# Patient Record
Sex: Male | Born: 1935 | Race: White | Hispanic: No | Marital: Married | Smoking: Never smoker
Health system: Southern US, Community
[De-identification: ages and names within clinical notes are randomized; demographics above are authoritative.]

## PROBLEM LIST (undated history)

## (undated) DIAGNOSIS — S6990XA Unspecified injury of unspecified wrist, hand and finger(s), initial encounter: Secondary | ICD-10-CM

## (undated) DIAGNOSIS — E785 Hyperlipidemia, unspecified: Secondary | ICD-10-CM

## (undated) DIAGNOSIS — I4891 Unspecified atrial fibrillation: Secondary | ICD-10-CM

## (undated) DIAGNOSIS — Z87828 Personal history of other (healed) physical injury and trauma: Secondary | ICD-10-CM

## (undated) DIAGNOSIS — I639 Cerebral infarction, unspecified: Secondary | ICD-10-CM

## (undated) DIAGNOSIS — C801 Malignant (primary) neoplasm, unspecified: Secondary | ICD-10-CM

## (undated) DIAGNOSIS — Z9889 Other specified postprocedural states: Secondary | ICD-10-CM

## (undated) DIAGNOSIS — R42 Dizziness and giddiness: Secondary | ICD-10-CM

## (undated) DIAGNOSIS — I10 Essential (primary) hypertension: Secondary | ICD-10-CM

## (undated) DIAGNOSIS — D689 Coagulation defect, unspecified: Secondary | ICD-10-CM

## (undated) HISTORY — DX: Personal history of other (healed) physical injury and trauma: Z87.828

## (undated) HISTORY — DX: Coagulation defect, unspecified: D68.9

## (undated) HISTORY — DX: Malignant (primary) neoplasm, unspecified: C80.1

## (undated) HISTORY — DX: Essential (primary) hypertension: I10

## (undated) HISTORY — DX: Unspecified injury of unspecified wrist, hand and finger(s), initial encounter: S69.90XA

## (undated) HISTORY — DX: Hyperlipidemia, unspecified: E78.5

## (undated) HISTORY — DX: Dizziness and giddiness: R42

## (undated) HISTORY — DX: Cerebral infarction, unspecified: I63.9

## (undated) HISTORY — DX: Other specified postprocedural states: Z98.890

---

## 1961-09-30 HISTORY — PX: FINGER DEBRIDEMENT: SHX1634

## 2001-09-30 HISTORY — PX: BACK SURGERY: SHX140

## 2004-09-30 HISTORY — PX: REPLACEMENT TOTAL KNEE: SUR1224

## 2017-08-06 ENCOUNTER — Ambulatory Visit (INDEPENDENT_AMBULATORY_CARE_PROVIDER_SITE_OTHER): Payer: Medicare Other | Admitting: Family Medicine

## 2017-08-06 ENCOUNTER — Encounter: Payer: Self-pay | Admitting: Family Medicine

## 2017-08-06 VITALS — BP 108/68 | HR 62 | Resp 16 | Ht 71.0 in | Wt 200.0 lb

## 2017-08-06 DIAGNOSIS — D75839 Thrombocytosis, unspecified: Secondary | ICD-10-CM

## 2017-08-06 DIAGNOSIS — G5603 Carpal tunnel syndrome, bilateral upper limbs: Secondary | ICD-10-CM | POA: Diagnosis not present

## 2017-08-06 DIAGNOSIS — R42 Dizziness and giddiness: Secondary | ICD-10-CM | POA: Diagnosis not present

## 2017-08-06 DIAGNOSIS — Z23 Encounter for immunization: Secondary | ICD-10-CM

## 2017-08-06 DIAGNOSIS — I480 Paroxysmal atrial fibrillation: Secondary | ICD-10-CM

## 2017-08-06 DIAGNOSIS — E785 Hyperlipidemia, unspecified: Secondary | ICD-10-CM

## 2017-08-06 DIAGNOSIS — E538 Deficiency of other specified B group vitamins: Secondary | ICD-10-CM | POA: Diagnosis not present

## 2017-08-06 DIAGNOSIS — D473 Essential (hemorrhagic) thrombocythemia: Secondary | ICD-10-CM

## 2017-08-06 DIAGNOSIS — I4891 Unspecified atrial fibrillation: Secondary | ICD-10-CM | POA: Insufficient documentation

## 2017-08-07 DIAGNOSIS — G5600 Carpal tunnel syndrome, unspecified upper limb: Secondary | ICD-10-CM | POA: Insufficient documentation

## 2017-08-07 LAB — LIPID PANEL
CHOLESTEROL TOTAL: 125 mg/dL (ref 100–199)
Chol/HDL Ratio: 3 ratio (ref 0.0–5.0)
HDL: 42 mg/dL (ref >39)
LDL Calculated: 62 mg/dL (ref 0–99)
TRIGLYCERIDES: 103 mg/dL (ref 0–149)
VLDL Cholesterol Cal: 21 mg/dL (ref 5–40)

## 2017-08-07 LAB — CBC
HEMOGLOBIN: 14.5 g/dL (ref 13.0–17.7)
Hematocrit: 43.8 % (ref 37.5–51.0)
MCH: 38.1 pg — ABNORMAL HIGH (ref 26.6–33.0)
MCHC: 33.1 g/dL (ref 31.5–35.7)
MCV: 115 fL — ABNORMAL HIGH (ref 79–97)
Platelets: 706 10*3/uL — ABNORMAL HIGH (ref 150–379)
RBC: 3.81 x10E6/uL — ABNORMAL LOW (ref 4.14–5.80)
RDW: 15.9 % — AB (ref 12.3–15.4)
WBC: 11.4 10*3/uL — AB (ref 3.4–10.8)

## 2017-08-07 LAB — COMPREHENSIVE METABOLIC PANEL
A/G RATIO: 1.6 (ref 1.2–2.2)
ALK PHOS: 85 IU/L (ref 39–117)
ALT: 16 IU/L (ref 0–44)
AST: 20 IU/L (ref 0–40)
Albumin: 4.2 g/dL (ref 3.2–4.6)
BILIRUBIN TOTAL: 0.4 mg/dL (ref 0.0–1.2)
BUN / CREAT RATIO: 16 (ref 10–24)
BUN: 18 mg/dL (ref 10–36)
CHLORIDE: 103 mmol/L (ref 96–106)
CO2: 28 mmol/L (ref 20–29)
Calcium: 9.3 mg/dL (ref 8.6–10.2)
Creatinine, Ser: 1.1 mg/dL (ref 0.76–1.27)
GFR calc non Af Amer: 59 mL/min/{1.73_m2} — ABNORMAL LOW (ref >59)
GFR, EST AFRICAN AMERICAN: 68 mL/min/{1.73_m2} (ref >59)
GLUCOSE: 99 mg/dL (ref 65–99)
Globulin, Total: 2.6 g/dL (ref 1.5–4.5)
POTASSIUM: 5.5 mmol/L — AB (ref 3.5–5.2)
Sodium: 142 mmol/L (ref 134–144)
TOTAL PROTEIN: 6.8 g/dL (ref 6.0–8.5)

## 2017-08-07 LAB — VITAMIN B12: VITAMIN B 12: 717 pg/mL (ref 232–1245)

## 2017-08-07 LAB — TSH: TSH: 4.96 u[IU]/mL — AB (ref 0.450–4.500)

## 2017-08-07 NOTE — Progress Notes (Signed)
Date:  08/06/2017   Name:  Lee Mercado   DOB:  [birth year]   MRN:  [id number]  PCP:  Adline Potter, MD    Chief Complaint: Establish Care (platelet count 2 days ago in [facility] was abnormal. Already on Eliquis. Requesting not to  change Eliquis as they went through lot of mess with insurance to get it covered. ) and Hand Pain (circulation is awfula t night and wakes him with nerve pain or asleep hands. R hand worse. )   History of Present Illness:  This is a 83 y.o. male see for initial visit, just moved here from [facility]. Hx afib and TIA on metoprolol/Eliquis past year, recurrent vertigo since "ministroke" 2 yrs ago, takes meclizine bid, occ postural dizziness. HLD on Pravachol x yrs. Thrombocytosis on hydroxyurea, told plts high last week in [place]. OA knees, Voltaren gel helps. Uses cane to ambulate, no recent falls. B hand pain and numbness mostly at night x yrs, R > L. Take mvit, B12, fish oil, takes full asa when feels bad, helps. S/p circumcision 3 yrs ago. BLE edema chronic, improves overnight, has used comp stockings in past, hospitalized for cellulitis R leg 3 months ago. Father died COPD/smoking 77, mother died lymphoma 22, sibs with lung ca, bone ca, DM. No known colonoscopy, last tet imm 15 yrs ago, had flu shot last month in [facility], had single pneumo imm yrs ago.   Review of Systems:  Review of Systems  Constitutional: Negative for chills and fever.  HENT: Negative for ear pain, sinus pain and trouble swallowing.   Eyes: Negative for pain.  Respiratory: Negative for cough and shortness of breath.   Cardiovascular: Negative for chest pain.  Gastrointestinal: Negative for abdominal pain, constipation and diarrhea.  Genitourinary: Negative for difficulty urinating.  Musculoskeletal: Negative for joint swelling.  Neurological: Negative for tremors and syncope.  Hematological: Negative for adenopathy.    Patient Active Problem List   Diagnosis Date Noted  . Thrombocytosis ([place]) 08/06/2017  .  Hyperlipidemia 08/06/2017  . Atrial fibrillation ([place]) 08/06/2017    Prior to Admission medications   Medication Sig Start Date End Date Taking? Authorizing Provider  apixaban (ELIQUIS) 5 MG TABS tablet Take 5 mg 2 (two) times daily by mouth.   Yes [provider]  aspirin EC 81 MG tablet Take 81 mg daily by mouth.   Yes [provider]  diclofenac sodium (VOLTAREN) 1 % GEL Apply 4 (four) times daily topically.   Yes [provider]  hydroxyurea (HYDREA) 500 MG capsule Take 500 mg daily by mouth. May take with food to minimize GI side effects.   Yes [provider]  meclizine (ANTIVERT) 25 MG tablet Take 25 mg 2 (two) times daily by mouth.    Yes [provider]  METOPROLOL TARTRATE PO Take 12.5 mg 2 (two) times daily by mouth.   Yes [provider]  pravastatin (PRAVACHOL) 20 MG tablet Take 20 mg daily by mouth.   Yes [provider]    No Known Allergies  Past Surgical History:  Procedure Laterality Date  . BACK SURGERY  2003  . FINGER DEBRIDEMENT  1963   Left hand 4 digit   . REPLACEMENT TOTAL KNEE Right 2006    Social History   Tobacco Use  . Smoking status: Never Smoker  . Smokeless tobacco: Never Used  Substance Use Topics  . Alcohol use: No    Frequency: Never  . Drug use: No    Family History  Problem Relation Age of Onset  . Cancer Mother   . Emphysema Father   . Diabetes Sister   . Paget's disease of bone Brother   . Cancer Sister        lung cancer    Medication list has been reviewed and updated.  Physical Examination: BP 108/68   Pulse 62   Resp 16   Ht 5\' 11"  (1.803 m)   Wt 200 lb (90.7 kg)   BMI 27.89 kg/m   Physical Exam  Constitutional: He is oriented to person, place, and time. He appears well-developed and well-nourished.  HENT:  Head: Normocephalic and atraumatic.  Right Ear: External ear normal.  Left Ear: External ear normal.  Nose: Nose normal.  Mouth/Throat:  Oropharynx is clear and moist.  TMs clear  Eyes: Conjunctivae and EOM are normal. Pupils are equal, round, and reactive to light.  Neck: Neck supple. No thyromegaly present.  Cardiovascular: Normal rate, regular rhythm, normal heart sounds and intact distal pulses.  Pulmonary/Chest: Effort normal and breath sounds normal.  Abdominal: Soft. He exhibits no distension and no mass. There is no tenderness.  Musculoskeletal:  2+ BLE edema  Lymphadenopathy:    He has no cervical adenopathy.  Neurological: He is alert and oriented to person, place, and time. Coordination normal.  Romberg negative but gait wide-based and shuffling  Skin: Skin is warm and dry.  Psychiatric: He has a normal mood and affect. His behavior is normal.  Nursing note and vitals reviewed.   Assessment and Plan:  1. Thrombocytosis (HCC) On hydroxyurea/asa - CBC  2. Paroxysmal atrial fibrillation (HCC) Stable on metoprolol/Eliquis - Comprehensive Metabolic Panel (CMET) - TSH  3. Bilateral carpal tunnel syndrome OTC B wrist splints overnight, consider ortho referral for injection if persists  4. Hyperlipidemia, unspecified hyperlipidemia type On Pravachol - Lipid Profile  5. Vertigo S/p CVA, cont meclizine for now though may contribute to confusion/falls, consider ENT referral  6. B12 deficiency On supplement - B12  7. Need for diphtheria-tetanus-pertussis (Tdap) vaccine - Tdap vaccine greater than or equal to 7yo IM  8. Need for pneumococcal vaccination - Pneumococcal conjugate vaccine 13-valent  Return in about 4 weeks (around 09/03/2017).   45 mins spent with pt over half in counseling  Satira Anis. Tyjai Charbonnet, [facility]  08/07/2017

## 2017-09-03 ENCOUNTER — Ambulatory Visit (INDEPENDENT_AMBULATORY_CARE_PROVIDER_SITE_OTHER): Payer: Medicare Other | Admitting: Family Medicine

## 2017-09-03 ENCOUNTER — Encounter: Payer: Self-pay | Admitting: Family Medicine

## 2017-09-03 VITALS — BP 110/74 | HR 65 | Resp 16 | Ht 71.0 in | Wt 198.0 lb

## 2017-09-03 DIAGNOSIS — D473 Essential (hemorrhagic) thrombocythemia: Secondary | ICD-10-CM | POA: Diagnosis not present

## 2017-09-03 DIAGNOSIS — L409 Psoriasis, unspecified: Secondary | ICD-10-CM | POA: Diagnosis not present

## 2017-09-03 DIAGNOSIS — I480 Paroxysmal atrial fibrillation: Secondary | ICD-10-CM | POA: Diagnosis not present

## 2017-09-03 DIAGNOSIS — G5603 Carpal tunnel syndrome, bilateral upper limbs: Secondary | ICD-10-CM

## 2017-09-03 DIAGNOSIS — E538 Deficiency of other specified B group vitamins: Secondary | ICD-10-CM

## 2017-09-03 DIAGNOSIS — E785 Hyperlipidemia, unspecified: Secondary | ICD-10-CM | POA: Diagnosis not present

## 2017-09-03 DIAGNOSIS — D75839 Thrombocytosis, unspecified: Secondary | ICD-10-CM

## 2017-09-03 NOTE — Patient Instructions (Signed)
Use bilateral wrists splints overnight for carpal tunnel syndrome.

## 2017-09-04 ENCOUNTER — Encounter: Payer: Self-pay | Admitting: Family Medicine

## 2017-09-04 DIAGNOSIS — N183 Chronic kidney disease, stage 3 unspecified: Secondary | ICD-10-CM | POA: Insufficient documentation

## 2017-09-04 DIAGNOSIS — E538 Deficiency of other specified B group vitamins: Secondary | ICD-10-CM | POA: Insufficient documentation

## 2017-09-04 DIAGNOSIS — L409 Psoriasis, unspecified: Secondary | ICD-10-CM | POA: Insufficient documentation

## 2017-09-04 LAB — BASIC METABOLIC PANEL
BUN / CREAT RATIO: 17 (ref 10–24)
BUN: 24 mg/dL (ref 10–36)
CO2: 25 mmol/L (ref 20–29)
Calcium: 9 mg/dL (ref 8.6–10.2)
Chloride: 103 mmol/L (ref 96–106)
Creatinine, Ser: 1.43 mg/dL — ABNORMAL HIGH (ref 0.76–1.27)
GFR calc Af Amer: 49 mL/min/{1.73_m2} — ABNORMAL LOW (ref >59)
GFR, EST NON AFRICAN AMERICAN: 43 mL/min/{1.73_m2} — AB (ref >59)
Glucose: 87 mg/dL (ref 65–99)
POTASSIUM: 5.3 mmol/L — AB (ref 3.5–5.2)
SODIUM: 142 mmol/L (ref 134–144)

## 2017-09-04 LAB — CBC
Hematocrit: 44.7 % (ref 37.5–51.0)
Hemoglobin: 14.9 g/dL (ref 13.0–17.7)
MCH: 39.3 pg — ABNORMAL HIGH (ref 26.6–33.0)
MCHC: 33.3 g/dL (ref 31.5–35.7)
MCV: 118 fL — AB (ref 79–97)
PLATELETS: 427 10*3/uL — AB (ref 150–379)
RBC: 3.79 x10E6/uL — ABNORMAL LOW (ref 4.14–5.80)
RDW: 16.8 % — AB (ref 12.3–15.4)
WBC: 8.6 10*3/uL (ref 3.4–10.8)

## 2017-09-04 LAB — FERRITIN: Ferritin: 375 ng/mL (ref 30–400)

## 2017-09-04 NOTE — Progress Notes (Signed)
Date:  09/03/2017   Name:  Lee Mercado   DOB:  1925-08-06   MRN:  [id number]  PCP:  Adline Potter, MD    Chief Complaint: Establish Care   History of Present Illness:  This is a 83 y.o. male seen for one month f/u from initial visit. Hydroxyurea dose increased due to elevated plt count. Remains on Eliquis and metoprolol for afib. Has not tried nightly wrist splints for B CTS. HLD on Pravachol, takes meclizine bid for vertigo s/p CVA, on B12 supplement. RLE edema stable. Saw derm for psoriasis.  Review of Systems:  Review of Systems  Constitutional: Negative for chills and fever.  Respiratory: Negative for cough and shortness of breath.   Cardiovascular: Negative for chest pain.  Genitourinary: Negative for difficulty urinating.  Neurological: Negative for syncope and light-headedness.    Patient Active Problem List   Diagnosis Date Noted  . Carpal tunnel syndrome 08/07/2017  . Thrombocytosis ([place]) 08/06/2017  . Hyperlipidemia 08/06/2017  . Atrial fibrillation ([place]) 08/06/2017    Prior to Admission medications   Medication Sig Start Date End Date Taking? Authorizing Provider  apixaban (ELIQUIS) 5 MG TABS tablet Take 5 mg 2 (two) times daily by mouth.   Yes [provider]  aspirin EC 81 MG tablet Take 81 mg daily by mouth.   Yes [provider]  cyanocobalamin 1000 MCG tablet Take 1,000 mcg daily by mouth.   Yes [provider]  diclofenac sodium (VOLTAREN) 1 % GEL Apply 4 (four) times daily topically.   Yes [provider]  hydroxyurea (HYDREA) 500 MG capsule Take 1,000 mg daily by mouth. May take with food to minimize GI side effects.    Yes [provider]  meclizine (ANTIVERT) 25 MG tablet Take 25 mg 2 (two) times daily by mouth.    Yes [provider]  METOPROLOL TARTRATE PO Take 12.5 mg 2 (two) times daily by mouth.   Yes [provider]  Multiple Vitamin (MULTIVITAMIN) tablet Take 1 tablet daily by mouth.    Yes [provider]  pravastatin (PRAVACHOL) 20 MG tablet Take 20 mg daily by mouth.   Yes [provider]    No Known Allergies  Past Surgical History:  Procedure Laterality Date  . BACK SURGERY  2003  . FINGER DEBRIDEMENT  1963   Left hand 4 digit   . REPLACEMENT TOTAL KNEE Right 2006    Social History   Tobacco Use  . Smoking status: Never Smoker  . Smokeless tobacco: Never Used  Substance Use Topics  . Alcohol use: No    Frequency: Never  . Drug use: No    Family History  Problem Relation Age of Onset  . Cancer Mother   . Emphysema Father   . Diabetes Sister   . Paget's disease of bone Brother   . Cancer Sister        lung cancer    Medication list has been reviewed and updated.  Physical Examination: BP 110/74   Pulse 65   Resp 16   Ht 5\' 11"  (1.803 m)   Wt 198 lb (89.8 kg)   SpO2 97%   BMI 27.62 kg/m   Physical Exam  Constitutional: He appears well-developed and well-nourished.  Cardiovascular: Normal rate, regular rhythm and normal heart sounds.  Pulmonary/Chest: Effort normal and breath sounds normal.  Musculoskeletal:  Trace RLE edema  Neurological: He is alert.  Skin: Skin is warm and dry.  Psychiatric: He has a normal mood  and affect. His behavior is normal.  Nursing note and vitals reviewed.   Assessment and Plan:  1. Thrombocytosis (HCC) On increased hydroxyurea - CBC - Ferritin - Basic Metabolic Panel (BMET)  2. Paroxysmal atrial fibrillation (HCC) Stable on Eliquis/metoprolol  3. Bilateral carpal tunnel syndrome Trial nightly B wrist splints  4. Hyperlipidemia, unspecified hyperlipidemia type Well controlled on Pravachol  5. Psoriasis Followed by derm  6. B12 deficiency Well controlled on supplement  7. Med review On meclizine bid for vertigo s/p CVA, consider taper  Return in about 3 months (around 12/02/2017).  Satira Anis. [facility]  09/04/2017

## 2017-09-11 ENCOUNTER — Telehealth: Payer: Self-pay

## 2017-09-11 NOTE — Telephone Encounter (Signed)
I had spoken to son already but he went there to pharmacy about meds we changed and upset that no script was delivered. I advised debra from pharmacy we did not write anything we went down on Meclizine. She will reassure him again.

## 2017-11-11 ENCOUNTER — Telehealth: Payer: Self-pay

## 2017-11-11 ENCOUNTER — Observation Stay
Admit: 2017-11-11 | Discharge: 2017-11-11 | Disposition: A | Discharge status: Home / Self Care | Payer: Medicare HMO | Attending: Internal Medicine | Admitting: Internal Medicine

## 2017-11-11 ENCOUNTER — Other Ambulatory Visit: Payer: Self-pay

## 2017-11-11 ENCOUNTER — Inpatient Hospital Stay
Admission: EM | Admit: 2017-11-11 | Discharge: 2017-11-13 | DRG: 064 | Disposition: A | Discharge status: Home / Self Care | Payer: Medicare HMO | Attending: Internal Medicine | Admitting: Internal Medicine

## 2017-11-11 ENCOUNTER — Encounter: Payer: Self-pay | Admitting: Emergency Medicine

## 2017-11-11 ENCOUNTER — Emergency Department: Payer: Medicare HMO

## 2017-11-11 DIAGNOSIS — Z7982 Long term (current) use of aspirin: Secondary | ICD-10-CM

## 2017-11-11 DIAGNOSIS — R4781 Slurred speech: Secondary | ICD-10-CM | POA: Diagnosis present

## 2017-11-11 DIAGNOSIS — E785 Hyperlipidemia, unspecified: Secondary | ICD-10-CM | POA: Diagnosis not present

## 2017-11-11 DIAGNOSIS — D689 Coagulation defect, unspecified: Secondary | ICD-10-CM | POA: Diagnosis present

## 2017-11-11 DIAGNOSIS — I4891 Unspecified atrial fibrillation: Secondary | ICD-10-CM | POA: Diagnosis not present

## 2017-11-11 DIAGNOSIS — Z66 Do not resuscitate: Secondary | ICD-10-CM | POA: Diagnosis present

## 2017-11-11 DIAGNOSIS — I10 Essential (primary) hypertension: Secondary | ICD-10-CM | POA: Diagnosis not present

## 2017-11-11 DIAGNOSIS — Z79899 Other long term (current) drug therapy: Secondary | ICD-10-CM

## 2017-11-11 DIAGNOSIS — I639 Cerebral infarction, unspecified: Secondary | ICD-10-CM | POA: Diagnosis not present

## 2017-11-11 DIAGNOSIS — M6281 Muscle weakness (generalized): Secondary | ICD-10-CM | POA: Diagnosis not present

## 2017-11-11 DIAGNOSIS — G459 Transient cerebral ischemic attack, unspecified: Secondary | ICD-10-CM | POA: Diagnosis present

## 2017-11-11 DIAGNOSIS — R29702 NIHSS score 2: Secondary | ICD-10-CM | POA: Diagnosis present

## 2017-11-11 DIAGNOSIS — G9341 Metabolic encephalopathy: Secondary | ICD-10-CM | POA: Diagnosis not present

## 2017-11-11 DIAGNOSIS — I482 Chronic atrial fibrillation: Secondary | ICD-10-CM | POA: Diagnosis present

## 2017-11-11 DIAGNOSIS — R4182 Altered mental status, unspecified: Secondary | ICD-10-CM | POA: Diagnosis not present

## 2017-11-11 DIAGNOSIS — D638 Anemia in other chronic diseases classified elsewhere: Secondary | ICD-10-CM | POA: Diagnosis present

## 2017-11-11 DIAGNOSIS — R531 Weakness: Secondary | ICD-10-CM | POA: Diagnosis not present

## 2017-11-11 DIAGNOSIS — Z7901 Long term (current) use of anticoagulants: Secondary | ICD-10-CM

## 2017-11-11 DIAGNOSIS — Z96651 Presence of right artificial knee joint: Secondary | ICD-10-CM | POA: Diagnosis present

## 2017-11-11 DIAGNOSIS — R42 Dizziness and giddiness: Secondary | ICD-10-CM | POA: Diagnosis not present

## 2017-11-11 HISTORY — DX: Unspecified atrial fibrillation: I48.91

## 2017-11-11 LAB — DIFFERENTIAL
Basophils Absolute: 0 10*3/uL (ref 0–0.1)
Basophils Relative: 0 %
Eosinophils Absolute: 0 10*3/uL (ref 0–0.7)
Eosinophils Relative: 1 %
Lymphocytes Relative: 26 %
Lymphs Abs: 1.5 10*3/uL (ref 1.0–3.6)
MONO ABS: 0.7 10*3/uL (ref 0.2–1.0)
Monocytes Relative: 12 %
Neutro Abs: 3.5 10*3/uL (ref 1.4–6.5)
Neutrophils Relative %: 61 %

## 2017-11-11 LAB — COMPREHENSIVE METABOLIC PANEL
ALBUMIN: 3.5 g/dL (ref 3.5–5.0)
ALK PHOS: 60 U/L (ref 38–126)
ALT: 9 U/L — AB (ref 17–63)
AST: 18 U/L (ref 15–41)
Anion gap: 6 (ref 5–15)
BUN: 26 mg/dL — ABNORMAL HIGH (ref 6–20)
CALCIUM: 8.8 mg/dL — AB (ref 8.9–10.3)
CO2: 26 mmol/L (ref 22–32)
CREATININE: 1.41 mg/dL — AB (ref 0.61–1.24)
Chloride: 105 mmol/L (ref 101–111)
GFR calc non Af Amer: 42 mL/min — ABNORMAL LOW (ref >60)
GFR, EST AFRICAN AMERICAN: 49 mL/min — AB (ref >60)
GLUCOSE: 104 mg/dL — AB (ref 65–99)
Potassium: 4 mmol/L (ref 3.5–5.1)
SODIUM: 137 mmol/L (ref 135–145)
Total Bilirubin: 1.2 mg/dL (ref 0.3–1.2)
Total Protein: 6.5 g/dL (ref 6.5–8.1)

## 2017-11-11 LAB — CBC
HEMATOCRIT: 32.4 % — AB (ref 40.0–52.0)
Hemoglobin: 11.3 g/dL — ABNORMAL LOW (ref 13.0–18.0)
MCH: 43.2 pg — ABNORMAL HIGH (ref 26.0–34.0)
MCHC: 34.7 g/dL (ref 32.0–36.0)
MCV: 124.4 fL — AB (ref 80.0–100.0)
PLATELETS: 160 10*3/uL (ref 150–440)
RBC: 2.6 MIL/uL — ABNORMAL LOW (ref 4.40–5.90)
RDW: 21.3 % — ABNORMAL HIGH (ref 11.5–14.5)
WBC: 5.7 10*3/uL (ref 3.8–10.6)

## 2017-11-11 LAB — APTT: aPTT: 42 seconds — ABNORMAL HIGH (ref 24–36)

## 2017-11-11 LAB — PROTIME-INR
INR: 1.33
PROTHROMBIN TIME: 16.4 s — AB (ref 11.4–15.2)

## 2017-11-11 LAB — TROPONIN I: Troponin I: <0.03 ng/mL (ref <0.03)

## 2017-11-11 MED ORDER — HYDROXYUREA 500 MG PO CAPS
1000.0000 mg | ORAL_CAPSULE | Freq: Every day | ORAL | Status: DC
  Administered 2017-11-12 – 2017-11-13 (×2): 1000 mg via ORAL
  Filled 2017-11-11 (×2): qty 2

## 2017-11-11 MED ORDER — METOPROLOL TARTRATE 25 MG PO TABS
12.5000 mg | ORAL_TABLET | Freq: Two times a day (BID) | ORAL | Status: DC
  Administered 2017-11-11 – 2017-11-13 (×4): 12.5 mg via ORAL
  Filled 2017-11-11 (×4): qty 1

## 2017-11-11 MED ORDER — VITAMIN B-12 1000 MCG PO TABS
1000.0000 ug | ORAL_TABLET | Freq: Every day | ORAL | Status: DC
  Administered 2017-11-12 – 2017-11-13 (×2): 1000 ug via ORAL
  Filled 2017-11-11 (×2): qty 1

## 2017-11-11 MED ORDER — MECLIZINE HCL 25 MG PO TABS
25.0000 mg | ORAL_TABLET | Freq: Every day | ORAL | Status: DC
  Administered 2017-11-12 – 2017-11-13 (×2): 25 mg via ORAL
  Filled 2017-11-11 (×2): qty 1

## 2017-11-11 MED ORDER — ADULT MULTIVITAMIN W/MINERALS CH
1.0000 | ORAL_TABLET | Freq: Every day | ORAL | Status: DC
  Administered 2017-11-12 – 2017-11-13 (×2): 1 via ORAL
  Filled 2017-11-11 (×2): qty 1

## 2017-11-11 MED ORDER — STROKE: EARLY STAGES OF RECOVERY BOOK
Freq: Once | Status: AC
  Administered 2017-11-11: 19:00:00

## 2017-11-11 MED ORDER — ACETAMINOPHEN 325 MG PO TABS: 650.0000 mg | ORAL_TABLET | ORAL | Status: DC | PRN

## 2017-11-11 MED ORDER — PRAVASTATIN SODIUM 20 MG PO TABS
20.0000 mg | ORAL_TABLET | Freq: Every day | ORAL | Status: DC
  Administered 2017-11-12 – 2017-11-13 (×2): 20 mg via ORAL
  Filled 2017-11-11 (×2): qty 1

## 2017-11-11 MED ORDER — ACETAMINOPHEN 160 MG/5ML PO SOLN
650.0000 mg | ORAL | Status: DC | PRN
  Filled 2017-11-11: qty 20.3

## 2017-11-11 MED ORDER — APIXABAN 5 MG PO TABS
5.0000 mg | ORAL_TABLET | Freq: Two times a day (BID) | ORAL | Status: DC
  Administered 2017-11-11 – 2017-11-13 (×4): 5 mg via ORAL
  Filled 2017-11-11 (×4): qty 1

## 2017-11-11 MED ORDER — ACETAMINOPHEN 650 MG RE SUPP
650.0000 mg | RECTAL | Status: DC | PRN
Start: 2017-11-11 — End: 2017-11-13

## 2017-11-11 MED ORDER — ASPIRIN EC 81 MG PO TBEC
81.0000 mg | DELAYED_RELEASE_TABLET | Freq: Every day | ORAL | Status: DC
  Administered 2017-11-12 – 2017-11-13 (×2): 81 mg via ORAL
  Filled 2017-11-11 (×2): qty 1

## 2017-11-11 NOTE — ED Triage Notes (Signed)
Pt via ems with altered mental status. Per EMS, pt awakened this morning c/o generalized weakness. 1 hour pta, pt's mental status changed and ems was called out. Pt seems to not be able to get his words out. Pt alert, oriented to person and place only.

## 2017-11-11 NOTE — H&P (Signed)
[facility] [place] NAME: Lee Mercado    MR#:  [id number]  DATE OF BIRTH:  June 02, 1925  DATE OF ADMISSION:  11/11/2017  PRIMARY CARE PHYSICIAN: Adline Potter, MD   REQUESTING/REFERRING PHYSICIAN: Harvest Dark, MD  CHIEF COMPLAINT:   Chief Complaint  Patient presents with  . Altered Mental Status    HISTORY OF PRESENT ILLNESS: Lee Mercado  is a 84 y.o. male with a known history of previous strokes, atrial fibrillation, essential hypertension, hyperlipidemia who has chronic intermittent dizziness who started having dizziness earlier today and then subsequently started having difficulty with speech and some confusion.  Patient was brought to the ED his symptoms have mostly resolved he does complain of some dizziness.  Patient seems to be at baseline.  He denies any asymmetrical weakness of the extremity denies any difficulty with speech currently.  CT scan of the head in the ER is negative. PAST MEDICAL HISTORY:   Past Medical History:  Diagnosis Date  . Cancer ([place])    skin cancers  . Coagulation defect ([place])   . Finger injury   . History of heat stroke   . Hyperlipidemia   . Hypertension   . Previous back surgery   . Stroke ([place])   . Vertigo     PAST SURGICAL HISTORY:  Past Surgical History:  Procedure Laterality Date  . BACK SURGERY  2003  . FINGER DEBRIDEMENT  1963   Left hand 4 digit   . REPLACEMENT TOTAL KNEE Right 2006    SOCIAL HISTORY:  Social History   Tobacco Use  . Smoking status: Never Smoker  . Smokeless tobacco: Never Used  Substance Use Topics  . Alcohol use: No    Frequency: Never    FAMILY HISTORY:  Family History  Problem Relation Age of Onset  . Cancer Mother   . Emphysema Father   . Diabetes Sister   . Paget's disease of bone Brother   . Cancer Sister        lung cancer    DRUG ALLERGIES: No Known Allergies  REVIEW OF SYSTEMS:   CONSTITUTIONAL: No fever, fatigue or weakness.  EYES:  No blurred or double vision.  EARS, NOSE, AND THROAT: No tinnitus or ear pain.  RESPIRATORY: No cough, shortness of breath, wheezing or hemoptysis.  CARDIOVASCULAR: No chest pain, orthopnea, edema.  GASTROINTESTINAL: No nausea, vomiting, diarrhea or abdominal pain.  GENITOURINARY: No dysuria, hematuria.  ENDOCRINE: No polyuria, nocturia,  HEMATOLOGY: No anemia, easy bruising or bleeding SKIN: No rash or lesion. MUSCULOSKELETAL: No joint pain or arthritis.   NEUROLOGIC: No tingling, numbness, weakness.  Positive dizziness PSYCHIATRY: No anxiety or depression.   MEDICATIONS AT HOME:  Prior to Admission medications   Medication Sig Start Date End Date Taking? Authorizing Provider  apixaban (ELIQUIS) 5 MG TABS tablet Take 5 mg 2 (two) times daily by mouth.   Yes [provider]  aspirin EC 81 MG tablet Take 81 mg daily by mouth.   Yes [provider]  cyanocobalamin 1000 MCG tablet Take 1,000 mcg daily by mouth.   Yes [provider]  hydroxyurea (HYDREA) 500 MG capsule Take 1,000 mg daily by mouth. May take with food to minimize GI side effects.    Yes [provider]  meclizine (ANTIVERT) 25 MG tablet Take 25 mg by mouth daily.    Yes [provider]  metoprolol tartrate (LOPRESSOR) 25 MG tablet Take 12.5 mg 2 (two) times daily by mouth.  Yes [provider]  Multiple Vitamin (MULTIVITAMIN) tablet Take 1 tablet daily by mouth.   Yes [provider]  pravastatin (PRAVACHOL) 20 MG tablet Take 20 mg daily by mouth.   Yes [provider]  diclofenac sodium (VOLTAREN) 1 % GEL Apply topically 4 (four) times daily as needed.    [provider]      PHYSICAL EXAMINATION:   VITAL SIGNS: Blood pressure (!) 143/71, pulse 82, temperature 98.2 F (36.8 C), temperature source Oral, resp. rate 17, height 6' (1.829 m), weight 185 lb (83.9 kg), SpO2 100 %.  GENERAL:  84 y.o.-year-old patient lying in the bed with no acute  distress.  EYES: Pupils equal, round, reactive to light and accommodation. No scleral icterus. Extraocular muscles intact.  HEENT: Head atraumatic, normocephalic. Oropharynx and nasopharynx clear.  NECK:  Supple, no jugular venous distention. No thyroid enlargement, no tenderness.  LUNGS: Normal breath sounds bilaterally, no wheezing, rales,rhonchi or crepitation. No use of accessory muscles of respiration.  CARDIOVASCULAR: S1, S2 normal. No murmurs, rubs, or gallops.  ABDOMEN: Soft, nontender, nondistended. Bowel sounds present. No organomegaly or mass.  EXTREMITIES: No pedal edema, cyanosis, or clubbing.  NEUROLOGIC: Cranial nerves II through XII are intact. Muscle strength 5/5 in all extremities. Sensation intact. Gait not checked.  PSYCHIATRIC: The patient is alert and oriented x 3.  SKIN: No obvious rash, lesion, or ulcer.   LABORATORY PANEL:   CBC Recent Labs  Lab 11/11/17 1530  WBC 5.7  HGB 11.3*  HCT 32.4*  PLT 160  MCV 124.4*  MCH 43.2*  MCHC 34.7  RDW 21.3*  LYMPHSABS 1.5  MONOABS 0.7  EOSABS 0.0  BASOSABS 0.0   ------------------------------------------------------------------------------------------------------------------  Chemistries  Recent Labs  Lab 11/11/17 1530  NA 137  K 4.0  CL 105  CO2 26  GLUCOSE 104*  BUN 26*  CREATININE 1.41*  CALCIUM 8.8*  AST 18  ALT 9*  ALKPHOS 60  BILITOT 1.2   ------------------------------------------------------------------------------------------------------------------ estimated creatinine clearance is 38.2 mL/min (A) (by C-G formula based on SCr of 1.41 mg/dL (H)). ------------------------------------------------------------------------------------------------------------------ No results for input(s): TSH, T4TOTAL, T3FREE, THYROIDAB in the last 72 hours.  Invalid input(s): FREET3   Coagulation profile Recent Labs  Lab 11/11/17 1530  INR 1.33    ------------------------------------------------------------------------------------------------------------------- No results for input(s): DDIMER in the last 72 hours. -------------------------------------------------------------------------------------------------------------------  Cardiac Enzymes Recent Labs  Lab 11/11/17 1530  TROPONINI <0.03   ------------------------------------------------------------------------------------------------------------------ Invalid input(s): POCBNP  ---------------------------------------------------------------------------------------------------------------  Urinalysis No results found for: COLORURINE, APPEARANCEUR, LABSPEC, PHURINE, GLUCOSEU, HGBUR, BILIRUBINUR, KETONESUR, PROTEINUR, UROBILINOGEN, NITRITE, LEUKOCYTESUR   RADIOLOGY: Ct Head Wo Contrast  Result Date: 11/11/2017 CLINICAL DATA:  Altered mental status and dysarthria EXAM: CT HEAD WITHOUT CONTRAST TECHNIQUE: Contiguous axial images were obtained from the base of the skull through the vertex without intravenous contrast. COMPARISON:  None. FINDINGS: Brain: There is mild supratentorial atrophy. There is moderate cerebellar atrophy. There is no intracranial mass, hemorrhage, extra-axial fluid collection, or midline shift. There are small lacunar infarcts in both cerebellar hemispheres. There is patchy small vessel disease in the centra semiovale bilaterally. No acute appearing infarct evident. Vascular: No hyperdense vessels. There is calcification in each carotid siphon region. Skull: The bony calvarium appears intact. Sinuses/Orbits: Mucosal thickening in several ethmoid air cells bilaterally. There is a retention cyst in a midportion left ethmoid air cell. Other visualized paranasal sinuses are clear. Orbits appear normal bilaterally. Other: Visualized mastoid air cells are clear. IMPRESSION: Atrophy, most notable in the cerebellum. There are scattered  cerebellar small infarcts which do  not appear acute. There is patchy small vessel disease in the centra semiovale bilaterally. No acute infarct evident on this study. No mass or hemorrhage. There are foci of arterial vascular calcification. There are foci of ethmoid paranasal sinus disease. Electronically Signed   By: Lowella Grip III M.D.   On: 11/11/2017 15:21    EKG: Orders placed or performed during the hospital encounter of 11/11/17  . ED EKG  . ED EKG    IMPRESSION AND PLAN: Patient is a 84 year old white male with previous history of CVAs presenting with altered mental status and difficulty with speech  1.  Acute CVA/TIA Place patient under observation Obtain neurology evaluation MRI MRA of the brain Carotid Doppler Echo of the heart Continue aspirin and Eliquis PT evaluation Continue Pravachol Check lipid panel in the a.m.  2.  History of atrial fibrillation continue Eliquis and metoprolol  3.  Essential hypertension continue metoprolol  4.  Hyperlipidemia unspecified continue Pravachol  5.  Anemia patient is noted to have some drop in his hemoglobin this needs to be monitored no evidence of acute bleeding noted patient will need outpatient CBC follow-up    All the records are reviewed and case discussed with ED provider. Management plans discussed with the patient, family and they are in agreement.  CODE STATUS:    Code Status Orders  (From admission, onward)        Start     Ordered   11/11/17 1637  Full code  Continuous     11/11/17 1638    Code Status History    Date Active Date Inactive Code Status Order ID Comments User Context   This patient has a current code status but no historical code status.       TOTAL TIME TAKING CARE OF THIS PATIENT:59minutes.    Dustin Flock M.D on 11/11/2017 at 4:40 PM  Between 7am to 6pm - Pager - [phone]  After 6pm go to [email or web address] - password [email or web address] [facility]  Office  [phone]  CC: Primary care  physician; Adline Potter, MD

## 2017-11-11 NOTE — ED Provider Notes (Signed)
[facility] Emergency Department Provider Note  Time seen: 3:28 PM  I have reviewed the triage vital signs and the nursing notes.   HISTORY  Chief Complaint Altered Mental Status    HPI Macky Galik is a 84 y.o. 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  According to the family patient was complaining of dizziness and weakness this morning, around 1:00 today he was speaking and they could not understand what he was saying.  Patient was brought to the emergency department for evaluation, triage nurse states upon arrival patient with difficulty speaking, which has improved since his arrival to the emergency department.  Currently the patient is awake alert, but disoriented to place and time.  Believes he was in [place] and the year was 2002.  His speech is overall clear, family states his speech has definitely improved since arrival to the emergency department.  Patient has a history of CVA in the past with dizziness as his only residual symptom.  Denies any focal weakness or numbness today.  Largely negative review of systems.   Past Medical History:  Diagnosis Date  . Cancer ([place])    skin cancers  . Coagulation defect ([place])   . Finger injury   . History of heat stroke   . Hyperlipidemia   . Hypertension   . Previous back surgery   . Stroke ([place])   . Vertigo     Patient Active Problem List   Diagnosis Date Noted  . Psoriasis 09/04/2017  . B12 deficiency 09/04/2017  . CKD (chronic kidney disease) stage 3, GFR 30-59 ml/min (HCC) 09/04/2017  . Carpal tunnel syndrome 08/07/2017  . Thrombocytosis ([place]) 08/06/2017  . Hyperlipidemia 08/06/2017  . Atrial fibrillation ([place]) 08/06/2017    Past Surgical History:  Procedure Laterality Date  . BACK SURGERY  2003  . FINGER DEBRIDEMENT  1963   Left hand 4  digit   . REPLACEMENT TOTAL KNEE Right 2006    Prior to Admission medications   Medication Sig Start Date End Date Taking? Authorizing Provider  apixaban (ELIQUIS) 5 MG TABS tablet Take 5 mg 2 (two) times daily by mouth.    [provider]  aspirin EC 81 MG tablet Take 81 mg daily by mouth.    [provider]  cyanocobalamin 1000 MCG tablet Take 1,000 mcg daily by mouth.    [provider]  diclofenac sodium (VOLTAREN) 1 % GEL Apply topically 4 (four) times daily as needed.    [provider]  hydroxyurea (HYDREA) 500 MG capsule Take 1,000 mg daily by mouth. May take with food to minimize GI side effects.     [provider]  meclizine (ANTIVERT) 25 MG tablet Take 25 mg 2 (two) times daily by mouth.     [provider]  METOPROLOL TARTRATE PO Take 12.5 mg 2 (two) times daily by mouth.    [provider]  Multiple Vitamin (MULTIVITAMIN) tablet Take 1 tablet daily by mouth.    [provider]  pravastatin (PRAVACHOL) 20 MG tablet Take 20 mg daily by mouth.    [provider]    No Known Allergies  Family History  Problem Relation Age of Onset  . Cancer Mother   . Emphysema Father   . Diabetes Sister   . Paget's disease of bone Brother   . Cancer Sister  lung cancer    Social History Social History   Tobacco Use  . Smoking status: Never Smoker  . Smokeless tobacco: Never Used  Substance Use Topics  . Alcohol use: No    Frequency: Never  . Drug use: No    Review of Systems Constitutional: Negative for fever.  Positive for dizziness. Eyes: Negative for visual complaints ENT: Negative for recent illness/congestion Cardiovascular: Negative for chest pain. Respiratory: Negative for shortness of breath. Gastrointestinal: Negative for abdominal pain, vomiting and diarrhea. Genitourinary: Negative for dysuria. Musculoskeletal: Negative for musculoskeletal complaints Skin: Negative for skin  complaints  Neurological: Negative for headache.  Denies focal weakness or numbness.  Speech difficulty improving. All other ROS negative  ____________________________________________   PHYSICAL EXAM:  VITAL SIGNS: ED Triage Vitals [11/11/17 1437]  Enc Vitals Group     BP (!) 142/68     Pulse Rate 82     Resp 19     Temp 98.2 F (36.8 C)     Temp Source Oral     SpO2 100 %     Weight 185 lb (83.9 kg)     Height 6' (1.829 m)     Head Circumference      Peak Flow      Pain Score      Pain Loc      Pain Edu?      Excl. in [place]?     Constitutional: Alert, knows he is in the hospital, knows he was having difficulty speaking this morning but does not know the year or the state. Eyes: Normal exam ENT   Head: Normocephalic and atraumatic.   Mouth/Throat: Mucous membranes are moist. Cardiovascular: Normal rate, regular rhythm.  Respiratory: Normal respiratory effort without tachypnea nor retractions. Breath sounds are clear Gastrointestinal: Soft and nontender. No distention.   Musculoskeletal: Nontender with normal range of motion in all extremities. No lower extremity tenderness or edema. Neurologic:  Normal speech and language. No gross focal neurologic deficits.  Equal grip strength bilaterally.  5/5 motor in all extremities.  No pronator drift.  No apparent lower extremity drift.  Cranial nerves appear intact. Skin:  Skin is warm, dry and intact.  Psychiatric: Mood and affect are normal.  ____________________________________________    EKG  EKG reviewed and interpreted by myself shows normal sinus rhythm at 53 bpm with a slightly widened QRS, normal axis, largely normal intervals with nonspecific but no concerning ST changes.  ____________________________________________    RADIOLOGY  CT scan negative for acute abnormality.  ____________________________________________   INITIAL IMPRESSION / ASSESSMENT AND PLAN / ED COURSE  Pertinent labs & imaging results  that were available during my care of the patient were reviewed by me and considered in my medical decision making (see chart for details).  Patient presents to the emergency department with weakness, dizziness difficulty speaking beginning at 1:00 today.  Patient is on Eliquis, not a TPA candidate due to anticoagulation.  Patient symptoms appear to be dramatically improving.  Her my examination the patient has normal speech, no word finding difficulty evident.  The patient is disoriented to place and time which the family states is abnormal, states normally he would be oriented x4.  Differential would include CVA, ICH, TIA, electrolyte/metabolic abnormality, infectious etiology.  We will check labs, CT scan, closely monitor and discuss with neurology.   NIH Stroke Scale   Interval: Baseline Time: 3:32 PM Person Administering Scale: Harvest Dark  Administer stroke scale items in the order listed. Record performance in  each category after each subscale exam. Do not go back and change scores. Follow directions provided for each exam technique. Scores should reflect what the patient does, not what the clinician thinks the patient can do. The clinician should record answers while administering the exam and work quickly. Except where indicated, the patient should not be coached (i.e., repeated requests to patient to make a special effort).   1a  Level of consciousness: 0=alert; keenly responsive  1b. LOC questions:  0=Performs both tasks correctly  1c. LOC commands: 0=Performs both tasks correctly  2.  Best Gaze: 0=normal  3.  Visual: 0=No visual loss  4. Facial Palsy: 0=Normal symmetric movement  5a.  Motor left arm: 0=No drift, limb holds 90 (or 45) degrees for full 10 seconds  5b.  Motor right arm: 0=No drift, limb holds 90 (or 45) degrees for full 10 seconds  6a. motor left leg: 0=No drift, limb holds 90 (or 45) degrees for full 10 seconds  6b  Motor right leg:  0=No drift, limb holds 90  (or 45) degrees for full 10 seconds  7. Limb Ataxia: 0=Absent  8.  Sensory: 0=Normal; no sensory loss  9. Best Language:  0=No aphasia, normal  10. Dysarthria: 0=Normal  11. Extinction and Inattention: 0=No abnormality  12. Distal motor function: 0=Normal   Total:   0    CT negative for acute abnormality.  Labs are largely at baseline besides a mild decrease in hemoglobin.  Rectal exam shows light brown stool, guaiac negative.  I discussed the patient with Dr. Doy Mince of neurology, as the patient is not completely back to baseline (still disoriented to place and time) she recommends admission to the hospital for continued workup.  Discussed with the hospitalist who will admit the patient. ____________________________________________   FINAL CLINICAL IMPRESSION(S) / ED DIAGNOSES  CVA    Harvest Dark, MD 11/11/17 [id number]

## 2017-11-11 NOTE — ED Notes (Signed)
Pt has some difficulty finding his words Family states this is not normal. Pt's confusion has improved since arriving.

## 2017-11-11 NOTE — ED Notes (Signed)
Phone given to family member to answer mri questions.

## 2017-11-11 NOTE — Telephone Encounter (Signed)
Daughter LM stating EMS taking patient to ER confusion and low Pulse Ox.

## 2017-11-12 ENCOUNTER — Observation Stay: Payer: Medicare HMO

## 2017-11-12 ENCOUNTER — Encounter: Payer: Self-pay | Admitting: Family Medicine

## 2017-11-12 ENCOUNTER — Observation Stay (HOSPITAL_COMMUNITY): Payer: Medicare HMO

## 2017-11-12 DIAGNOSIS — Z79899 Other long term (current) drug therapy: Secondary | ICD-10-CM | POA: Diagnosis not present

## 2017-11-12 DIAGNOSIS — R29702 NIHSS score 2: Secondary | ICD-10-CM | POA: Diagnosis present

## 2017-11-12 DIAGNOSIS — G459 Transient cerebral ischemic attack, unspecified: Secondary | ICD-10-CM

## 2017-11-12 DIAGNOSIS — Z66 Do not resuscitate: Secondary | ICD-10-CM | POA: Diagnosis present

## 2017-11-12 DIAGNOSIS — D689 Coagulation defect, unspecified: Secondary | ICD-10-CM | POA: Diagnosis present

## 2017-11-12 DIAGNOSIS — G9341 Metabolic encephalopathy: Secondary | ICD-10-CM | POA: Diagnosis not present

## 2017-11-12 DIAGNOSIS — I10 Essential (primary) hypertension: Secondary | ICD-10-CM | POA: Diagnosis present

## 2017-11-12 DIAGNOSIS — R42 Dizziness and giddiness: Secondary | ICD-10-CM | POA: Diagnosis not present

## 2017-11-12 DIAGNOSIS — I639 Cerebral infarction, unspecified: Secondary | ICD-10-CM | POA: Diagnosis present

## 2017-11-12 DIAGNOSIS — Z7982 Long term (current) use of aspirin: Secondary | ICD-10-CM | POA: Diagnosis not present

## 2017-11-12 DIAGNOSIS — I4891 Unspecified atrial fibrillation: Secondary | ICD-10-CM | POA: Diagnosis not present

## 2017-11-12 DIAGNOSIS — I482 Chronic atrial fibrillation: Secondary | ICD-10-CM | POA: Diagnosis present

## 2017-11-12 DIAGNOSIS — Z96651 Presence of right artificial knee joint: Secondary | ICD-10-CM | POA: Diagnosis present

## 2017-11-12 DIAGNOSIS — I6523 Occlusion and stenosis of bilateral carotid arteries: Secondary | ICD-10-CM | POA: Diagnosis not present

## 2017-11-12 DIAGNOSIS — E785 Hyperlipidemia, unspecified: Secondary | ICD-10-CM | POA: Diagnosis present

## 2017-11-12 DIAGNOSIS — Z7901 Long term (current) use of anticoagulants: Secondary | ICD-10-CM | POA: Diagnosis not present

## 2017-11-12 DIAGNOSIS — D638 Anemia in other chronic diseases classified elsewhere: Secondary | ICD-10-CM | POA: Diagnosis not present

## 2017-11-12 DIAGNOSIS — R4781 Slurred speech: Secondary | ICD-10-CM | POA: Diagnosis present

## 2017-11-12 DIAGNOSIS — I6389 Other cerebral infarction: Secondary | ICD-10-CM | POA: Diagnosis not present

## 2017-11-12 LAB — URINALYSIS, ROUTINE W REFLEX MICROSCOPIC
Bilirubin Urine: NEGATIVE
Glucose, UA: NEGATIVE mg/dL
HGB URINE DIPSTICK: NEGATIVE
Ketones, ur: NEGATIVE mg/dL
LEUKOCYTES UA: NEGATIVE
NITRITE: NEGATIVE
PROTEIN: NEGATIVE mg/dL
Specific Gravity, Urine: 1.006 (ref 1.005–1.030)
pH: 7 (ref 5.0–8.0)

## 2017-11-12 LAB — MRSA PCR SCREENING: MRSA by PCR: POSITIVE — AB

## 2017-11-12 LAB — LIPID PANEL
CHOL/HDL RATIO: 2.8 ratio
Cholesterol: 113 mg/dL (ref 0–200)
HDL: 40 mg/dL — AB (ref >40)
LDL CALC: 65 mg/dL (ref 0–99)
TRIGLYCERIDES: 42 mg/dL (ref <150)
VLDL: 8 mg/dL (ref 0–40)

## 2017-11-12 LAB — ECHOCARDIOGRAM COMPLETE
HEIGHTINCHES: 72 in
WEIGHTICAEL: 2960 [oz_av]

## 2017-11-12 LAB — HEMOGLOBIN A1C
Hgb A1c MFr Bld: 5.7 % — ABNORMAL HIGH (ref 4.8–5.6)
Mean Plasma Glucose: 116.89 mg/dL

## 2017-11-12 MED ORDER — CHLORHEXIDINE GLUCONATE CLOTH 2 % EX PADS
6.0000 | MEDICATED_PAD | Freq: Every day | CUTANEOUS | Status: DC
  Administered 2017-11-13: 6 via TOPICAL

## 2017-11-12 MED ORDER — MUPIROCIN 2 % EX OINT
1.0000 "application " | TOPICAL_OINTMENT | Freq: Two times a day (BID) | CUTANEOUS | Status: DC
  Administered 2017-11-12 – 2017-11-13 (×3): 1 via NASAL
  Filled 2017-11-12: qty 22

## 2017-11-12 NOTE — Consult Note (Signed)
Referring Physician: Manuella Ghazi    Chief Complaint: Slurred speech  HPI: Lee Mercado is an 84 y.o. male with a past medical history of hypertension, hyperlipidemia, prior infarcts, vertigo on Eliquis and ASA, who presented to the ED for generalized weakness/fatigue, vertigo and difficulty speaking.  According to the family the patient was complaining of dizziness and weakness in the morning on yesterday.  Then around 1p he was speaking and they could not understand what he was saying.  Patient was brought to the ED for evaluation.  Speech improved in the ED but patient remained somewhat confused.  Patient reports compliance with ASA and Eliquis.  Date last known well: Date: 11/10/2017 Time last known well: Unable to determine tPA Given: No: On Eliquis  Past Medical History:  Diagnosis Date  . A-fib ([place])   . Cancer ([place])    skin cancers  . Coagulation defect ([place])   . Finger injury   . History of heat stroke   . Hyperlipidemia   . Hypertension   . Previous back surgery   . Stroke ([place])   . Vertigo     Past Surgical History:  Procedure Laterality Date  . BACK SURGERY  2003  . FINGER DEBRIDEMENT  1963   Left hand 4 digit   . REPLACEMENT TOTAL KNEE Right 2006    Family History  Problem Relation Age of Onset  . Cancer Mother   . Emphysema Father   . Diabetes Sister   . Paget's disease of bone Brother   . Cancer Sister        lung cancer   Social History:  reports that  has never smoked. he has never used smokeless tobacco. He reports that he does not drink alcohol or use drugs.  Allergies: No Known Allergies  Medications:  I have reviewed the patient's current medications. Prior to Admission:  Medications Prior to Admission  Medication Sig Dispense Refill Last Dose  . apixaban (ELIQUIS) 5 MG TABS tablet Take 5 mg 2 (two) times daily by mouth.   11/11/2017 at Unknown time  . aspirin EC 81 MG tablet Take 81 mg daily by mouth.   11/11/2017 at Unknown time  . cyanocobalamin  1000 MCG tablet Take 1,000 mcg daily by mouth.   11/11/2017 at Unknown time  . hydroxyurea (HYDREA) 500 MG capsule Take 1,000 mg daily by mouth. May take with food to minimize GI side effects.    11/11/2017 at Unknown time  . meclizine (ANTIVERT) 25 MG tablet Take 25 mg by mouth daily.    11/11/2017 at Unknown time  . metoprolol tartrate (LOPRESSOR) 25 MG tablet Take 12.5 mg 2 (two) times daily by mouth.   11/11/2017 at Unknown time  . Multiple Vitamin (MULTIVITAMIN) tablet Take 1 tablet daily by mouth.   11/11/2017 at Unknown time  . pravastatin (PRAVACHOL) 20 MG tablet Take 20 mg daily by mouth.   11/11/2017 at Unknown time  . diclofenac sodium (VOLTAREN) 1 % GEL Apply topically 4 (four) times daily as needed.   PRN at PRN   Scheduled: . apixaban  5 mg Oral BID  . aspirin EC  81 mg Oral Daily  . Chlorhexidine Gluconate Cloth  6 each Topical Q0600  . hydroxyurea  1,000 mg Oral Daily  . meclizine  25 mg Oral Daily  . metoprolol tartrate  12.5 mg Oral BID  . multivitamin with minerals  1 tablet Oral Daily  . mupirocin ointment  1 application Nasal BID  . pravastatin  20 mg  Oral Daily  . cyanocobalamin  1,000 mcg Oral Daily    ROS: History obtained from the patient  General ROS: negative for - chills, fatigue, fever, night sweats, weight gain or weight loss Psychological ROS: as noted in HPI Ophthalmic ROS: negative for - blurry vision, double vision, eye pain or loss of vision ENT ROS: negative for - epistaxis, nasal discharge, oral lesions, sore throat, tinnitus or vertigo Allergy and Immunology ROS: negative for - hives or itchy/watery eyes Hematological and Lymphatic ROS: negative for - bleeding problems, bruising or swollen lymph nodes Endocrine ROS: negative for - galactorrhea, hair pattern changes, polydipsia/polyuria or temperature intolerance Respiratory ROS: negative for - cough, hemoptysis, shortness of breath or wheezing Cardiovascular ROS: negative for - chest pain, dyspnea on  exertion, edema or irregular heartbeat Gastrointestinal ROS: negative for - abdominal pain, diarrhea, hematemesis, nausea/vomiting or stool incontinence Genito-Urinary ROS: negative for - dysuria, hematuria, incontinence or urinary frequency/urgency Musculoskeletal ROS: negative for - joint swelling or muscular weakness Neurological ROS: as noted in HPI Dermatological ROS: negative for rash and skin lesion changes  Physical Examination: Blood pressure (!) 150/68, pulse 62, temperature 97.7 F (36.5 C), temperature source Oral, resp. rate 16, height 5' 11.5" (1.816 m), weight 84.3 kg (185 lb 12.8 oz), SpO2 100 %.  HEENT-  Normocephalic, no lesions, without obvious abnormality.  Normal external eye and conjunctiva.  Normal TM's bilaterally.  Normal auditory canals and external ears. Normal external nose, mucus membranes and septum.  Normal pharynx. Cardiovascular- S1, S2 normal, pulses palpable throughout   Lungs- chest clear, no wheezing, rales, normal symmetric air entry Abdomen- soft, non-tender; bowel sounds normal; no masses,  no organomegaly Extremities- no edema Lymph-no adenopathy palpable Musculoskeletal-no joint tenderness, deformity or swelling Skin-warm and dry, no hyperpigmentation, vitiligo, or suspicious lesions  Neurological Examination   Mental Status: Alert.  Reports it is 2003 and June.  Reports being in [place] but in [place].  Unable to tell me the President.  Speech fluent without evidence of aphasia.  Able to follow 3 step commands without difficulty. Cranial Nerves: II: Discs flat bilaterally; Visual fields grossly normal, pupils equal, round, reactive to light and accommodation III,IV, VI: ptosis not present, extra-ocular motions intact bilaterally V,VII: mild decrease in the left NLF, facial light touch sensation normal bilaterally VIII: hearing normal bilaterally IX,X: gag reflex present XI: bilateral shoulder shrug XII: tongue deviation to the right Motor: Right  : Upper extremity   5/5    Left:     Upper extremity   5/5  Lower extremity   5/5     Lower extremity   5/5 Tone and bulk:normal tone throughout; no atrophy noted Sensory: Pinprick and light touch intact throughout, bilaterally Deep Tendon Reflexes: 2+ in the upper extremities, absent in the lower extremities Plantars: Right: downgoing   Left: downgoing Cerebellar: Normal finger-to-nose and normal heel-to-shin testing bilaterally Gait: not tested due to safety concerns    Laboratory Studies:  Basic Metabolic Panel: Recent Labs  Lab 11/11/17 1530  NA 137  K 4.0  CL 105  CO2 26  GLUCOSE 104*  BUN 26*  CREATININE 1.41*  CALCIUM 8.8*    Liver Function Tests: Recent Labs  Lab 11/11/17 1530  AST 18  ALT 9*  ALKPHOS 60  BILITOT 1.2  PROT 6.5  ALBUMIN 3.5   No results for input(s): LIPASE, AMYLASE in the last 168 hours. No results for input(s): AMMONIA in the last 168 hours.  CBC: Recent Labs  Lab 11/11/17 1530  WBC 5.7  NEUTROABS 3.5  HGB 11.3*  HCT 32.4*  MCV 124.4*  PLT 160    Cardiac Enzymes: Recent Labs  Lab 11/11/17 1530  TROPONINI <0.03    BNP: Invalid input(s): POCBNP  CBG: No results for input(s): GLUCAP in the last 168 hours.  Microbiology: Results for orders placed or performed during the hospital encounter of 11/11/17  MRSA PCR Screening     Status: Abnormal   Collection Time: 11/12/17  7:19 AM  Result Value Ref Range Status   MRSA by PCR POSITIVE (A) NEGATIVE Final    Comment:        The GeneXpert MRSA Assay (FDA approved for NASAL specimens only), is one component of a comprehensive MRSA colonization surveillance program. It is not intended to diagnose MRSA infection nor to guide or monitor treatment for MRSA infections. RESULT CALLED TO, READ BACK BY AND VERIFIED WITH: Georga Hacking AT 0865 11/12/17 SDR Performed at [facility], [address] [place], [place] [address]     Coagulation Studies: Recent Labs     11/11/17 1530  LABPROT 16.4*  INR 1.33    Urinalysis:  Recent Labs  Lab 11/12/17 0601  COLORURINE YELLOW*  LABSPEC 1.006  PHURINE 7.0  GLUCOSEU NEGATIVE  HGBUR NEGATIVE  BILIRUBINUR NEGATIVE  KETONESUR NEGATIVE  PROTEINUR NEGATIVE  NITRITE NEGATIVE  LEUKOCYTESUR NEGATIVE    Lipid Panel:    Component Value Date/Time   CHOL 113 11/12/2017 0518   CHOL 125 08/06/2017 1651   TRIG 42 11/12/2017 0518   HDL 40 (L) 11/12/2017 0518   HDL 42 08/06/2017 1651   CHOLHDL 2.8 11/12/2017 0518   VLDL 8 11/12/2017 0518   LDLCALC 65 11/12/2017 0518   LDLCALC 62 08/06/2017 1651    HgbA1C:  Lab Results  Component Value Date   HGBA1C 5.7 (H) 11/12/2017    Urine Drug Screen:  No results found for: LABOPIA, COCAINSCRNUR, LABBENZ, AMPHETMU, THCU, LABBARB  Alcohol Level: No results for input(s): ETH in the last 168 hours.   Imaging: Ct Head Wo Contrast  Result Date: 11/11/2017 CLINICAL DATA:  Altered mental status and dysarthria EXAM: CT HEAD WITHOUT CONTRAST TECHNIQUE: Contiguous axial images were obtained from the base of the skull through the vertex without intravenous contrast. COMPARISON:  None. FINDINGS: Brain: There is mild supratentorial atrophy. There is moderate cerebellar atrophy. There is no intracranial mass, hemorrhage, extra-axial fluid collection, or midline shift. There are small lacunar infarcts in both cerebellar hemispheres. There is patchy small vessel disease in the centra semiovale bilaterally. No acute appearing infarct evident. Vascular: No hyperdense vessels. There is calcification in each carotid siphon region. Skull: The bony calvarium appears intact. Sinuses/Orbits: Mucosal thickening in several ethmoid air cells bilaterally. There is a retention cyst in a midportion left ethmoid air cell. Other visualized paranasal sinuses are clear. Orbits appear normal bilaterally. Other: Visualized mastoid air cells are clear. IMPRESSION: Atrophy, most notable in the cerebellum.  There are scattered cerebellar small infarcts which do not appear acute. There is patchy small vessel disease in the centra semiovale bilaterally. No acute infarct evident on this study. No mass or hemorrhage. There are foci of arterial vascular calcification. There are foci of ethmoid paranasal sinus disease. Electronically Signed   By: Lowella Grip III M.D.   On: 11/11/2017 15:21   Mr Brain Wo Contrast  Result Date: 11/12/2017 CLINICAL DATA:  Dizziness today, speech difficulties and confusion. History of stroke, atrial fibrillation, hypertension, hyperlipidemia, dizziness. EXAM: MRI HEAD WITHOUT CONTRAST MRA  HEAD WITHOUT CONTRAST TECHNIQUE: Multiplanar, multiecho pulse sequences of the brain and surrounding structures were obtained without intravenous contrast. Angiographic images of the head were obtained using MRA technique without contrast. COMPARISON:  CT HEAD November 11, 2017 FINDINGS: MRI HEAD FINDINGS INTRACRANIAL CONTENTS: No reduced diffusion to suggest acute ischemia. No susceptibility artifact to suggest hemorrhage. The ventricles and sulci are normal for patient's age. Prominent basal ganglia perivascular spaces associated with chronic small vessel ischemic disease. No suspicious parenchymal signal, masses, mass effect. No abnormal extra-axial fluid collections. Multiple old small cerebellar infarcts. Patchy to confluent supratentorial white matter FLAIR T2 hyperintensities. No extra-axial masses. VASCULAR: Normal major intracranial vascular flow voids present at skull base. SKULL AND UPPER CERVICAL SPINE: No abnormal sellar expansion. No suspicious calvarial bone marrow signal. Craniocervical junction maintained. SINUSES/ORBITS: Trace paranasal sinus mucosal thickening without air-fluid levels. Trace RIGHT mastoid effusion. Included ocular globes and orbital contents are non-suspicious. OTHER: None. MRA HEAD FINDINGS ANTERIOR CIRCULATION: Normal flow related enhancement of the included  cervical, petrous, cavernous and supraclinoid internal carotid arteries. Patent anterior communicating artery. Patent anterior and middle cerebral arteries, including distal segments. No large vessel occlusion, flow limiting stenosis, aneurysm. POSTERIOR CIRCULATION: LEFT vertebral artery is dominant. RIGHT vertebral artery terminates in the posterior inferior cerebellar artery. Basilar artery is patent, with normal flow related enhancement of the main branch vessels. Patent posterior cerebral arteries. Fetal origin RIGHT posterior cerebral artery with slower flow. Small LEFT posterior communicating artery present. No large vessel occlusion, flow limiting stenosis,  aneurysm. ANATOMIC VARIANTS: None. Source images and MIP images were reviewed. IMPRESSION: MRI HEAD: 1. No acute intracranial process. 2. Multiple old cerebellar infarcts. 3. Moderate chronic small vessel ischemic disease. MRA HEAD: 1. No emergent large vessel occlusion or flow limiting stenosis. Electronically Signed   By: Elon Alas M.D.   On: 11/12/2017 02:11   US Carotid Bilateral (at [facility] And [facility] Only)  Result Date: 11/12/2017 CLINICAL DATA:  CVA EXAM: BILATERAL CAROTID DUPLEX ULTRASOUND TECHNIQUE: Pearline Cables scale imaging, color Doppler and duplex ultrasound were performed of bilateral carotid and vertebral arteries in the neck. COMPARISON:  None. FINDINGS: Criteria: Quantification of carotid stenosis is based on velocity parameters that correlate the residual internal carotid diameter with NASCET-based stenosis levels, using the diameter of the distal internal carotid lumen as the denominator for stenosis measurement. The following velocity measurements were obtained: RIGHT ICA:  217 cm/sec CCA:  71 cm/sec SYSTOLIC ICA/CCA RATIO:  3.1 DIASTOLIC ICA/CCA RATIO:  5.4 ECA:  170 cm/sec LEFT ICA:  116 cm/sec CCA:  833 cm/sec SYSTOLIC ICA/CCA RATIO:  1.1 DIASTOLIC ICA/CCA RATIO:  2.1 ECA:  111 cm/sec RIGHT CAROTID ARTERY: Moderate irregular mixed  plaque in the bulb. Low resistance internal carotid Doppler pattern. RIGHT VERTEBRAL ARTERY:  Antegrade. LEFT CAROTID ARTERY: Moderate mixed plaque in the bulb. Low resistance internal carotid Doppler pattern. LEFT VERTEBRAL ARTERY:  Antegrade. 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 Less than 50% stenosis in the left internal carotid artery. Electronically Signed   By: Marybelle Killings M.D.   On: 11/12/2017 10:57   Mr Jodene Nam Head/brain AS Cm  Result Date: 11/12/2017 CLINICAL DATA:  Dizziness today, speech difficulties and confusion. History of stroke, atrial fibrillation, hypertension, hyperlipidemia, dizziness. EXAM: MRI HEAD WITHOUT CONTRAST MRA HEAD WITHOUT CONTRAST TECHNIQUE: Multiplanar, multiecho pulse sequences of the brain and surrounding structures were obtained without intravenous contrast. Angiographic images of the head were obtained using MRA technique without contrast. COMPARISON:  CT HEAD November 11, 2017 FINDINGS: MRI  HEAD FINDINGS INTRACRANIAL CONTENTS: No reduced diffusion to suggest acute ischemia. No susceptibility artifact to suggest hemorrhage. The ventricles and sulci are normal for patient's age. Prominent basal ganglia perivascular spaces associated with chronic small vessel ischemic disease. No suspicious parenchymal signal, masses, mass effect. No abnormal extra-axial fluid collections. Multiple old small cerebellar infarcts. Patchy to confluent supratentorial white matter FLAIR T2 hyperintensities. No extra-axial masses. VASCULAR: Normal major intracranial vascular flow voids present at skull base. SKULL AND UPPER CERVICAL SPINE: No abnormal sellar expansion. No suspicious calvarial bone marrow signal. Craniocervical junction maintained. SINUSES/ORBITS: Trace paranasal sinus mucosal thickening without air-fluid levels. Trace RIGHT mastoid effusion. Included ocular globes and orbital contents are non-suspicious. OTHER: None. MRA HEAD FINDINGS ANTERIOR CIRCULATION:  Normal flow related enhancement of the included cervical, petrous, cavernous and supraclinoid internal carotid arteries. Patent anterior communicating artery. Patent anterior and middle cerebral arteries, including distal segments. No large vessel occlusion, flow limiting stenosis, aneurysm. POSTERIOR CIRCULATION: LEFT vertebral artery is dominant. RIGHT vertebral artery terminates in the posterior inferior cerebellar artery. Basilar artery is patent, with normal flow related enhancement of the main branch vessels. Patent posterior cerebral arteries. Fetal origin RIGHT posterior cerebral artery with slower flow. Small LEFT posterior communicating artery present. No large vessel occlusion, flow limiting stenosis,  aneurysm. ANATOMIC VARIANTS: None. Source images and MIP images were reviewed. IMPRESSION: MRI HEAD: 1. No acute intracranial process. 2. Multiple old cerebellar infarcts. 3. Moderate chronic small vessel ischemic disease. MRA HEAD: 1. No emergent large vessel occlusion or flow limiting stenosis. Electronically Signed   By: Elon Alas M.D.   On: 11/12/2017 02:11    Assessment: 84 y.o. male with a history of prior stroke and atrial fibrillation on a statin, ASA and Eliquis who presents with intermittent speech deficits and vertigo.  Patient remains with some confusion.  Renal function stable.  Afebrile.   MRI of the brain reviewed and shows no acute changes.  MRA shows no evidence of LVO.  A1c 5.7.  LDL 65.  Carotid dopplers show 50-69% RICA stenosis and no evidence of hemodynamically significant LICA stenosis.  With continued confusion can not rule out a MR negative infarct and/or seizure.  Stroke Risk Factors - atrial fibrillation, hyperlipidemia and hypertension  Plan: 1. EEG 2. Continue ASA, Eliquis and statin   Alexis Goodell, MD Neurology [phone] 11/12/2017, 11:41 AM

## 2017-11-12 NOTE — Evaluation (Signed)
Occupational Therapy Evaluation Patient Details Name: Lee Mercado MRN: [id number] DOB: [birth year] Today's Date: 11/12/2017    History of Present Illness Pt is a 84 y/o M who presented with c/o difficulty with speech and confusion.  Once pt arrived at the ED the symptoms seemed to have mostly resolved.  CT scan of the head was negative.  Pt's PMH includes skin cancer, heat stroke, stroke, vertigo, back surgery, R TKA.    Clinical Impression   Pt seen for OT evaluation this date. Pt presents near baseline independence for ADL tasks and mobility. Pt from ALF and verbalizes good awareness of activity tolerance and need for activity pacing based on how he's feeling. Family in room corroborate pt's response. Pt eager to return home to ALF. Pt declined additional OT stating, "I do my exercises for therapy at home every day." Pt safe to return to ALF, no skilled OT needs identified. Will sign off. Please re-consult if additional needs arise.     Follow Up Recommendations  No OT follow up    Equipment Recommendations  None recommended by OT    Recommendations for Other Services       Precautions / Restrictions Precautions Precautions: Fall Restrictions Weight Bearing Restrictions: No      Mobility Bed Mobility Overal bed mobility: Modified Independent             General bed mobility comments: No physical assist or cues needed.  Pt performs independently.   Transfers Overall transfer level: Needs assistance Equipment used: 1 person hand held assist Transfers: Sit to/from Stand Sit to Stand: Min assist         General transfer comment: 1 person HHA (pt uses SPC at baseline).  Pt slow to stand but remains steady.     Balance Overall balance assessment: Needs assistance Sitting-balance support: No upper extremity supported;Feet supported Sitting balance-Leahy Scale: Good     Standing balance support: Single extremity supported;During functional activity Standing  balance-Leahy Scale: Poor Standing balance comment: Relies on UE support for static and dynamic activities                           ADL either performed or assessed with clinical judgement   ADL Overall ADL's : At baseline;Modified independent                                             Vision Baseline Vision/History: No visual deficits Patient Visual Report: No change from baseline Vision Assessment?: No apparent visual deficits Additional Comments: Pt notes he had blurry vision previous date when coming to the hospital, but no longer blurry, vision back to baseline     Perception     Praxis      Pertinent Vitals/Pain Pain Assessment: No/denies pain     Hand Dominance Right   Extremity/Trunk Assessment Upper Extremity Assessment Upper Extremity Assessment: Generalized weakness(grossly 4/5 bilaterally, sensation intact, coordination intact)    Lower Extremity Assessment Lower Extremity Assessment: Defer to PT evaluation;Generalized weakness(grossly 4/5 bilaterally, sensation intact)    Cervical / Trunk Assessment Cervical / Trunk Assessment: Normal   Communication Communication Communication: No difficulties   Cognition Arousal/Alertness: Awake/alert Behavior During Therapy: WFL for tasks assessed/performed Overall Cognitive Status: Within Functional Limits for tasks assessed  General Comments  Pt reports h/o vertigo ever since his previous strokes.  He says it is worse when he does not get enough sleep.  Pt reports that if he watches TV too long his vision gets blurry.  He feels dizzy at times when looking at things moving fast.     Exercises Other Exercises Other Exercises: pt/family instructed in falls prevention strategies   Shoulder Instructions      Home Living Family/patient expects to be discharged to:: Assisted living([facility]) Living Arrangements: Spouse/significant  other Available Help at Discharge: Family;Available 24 hours/day Type of Home: Assisted living Home Access: Level entry     Home Layout: One level     Bathroom Shower/Tub: Teacher, early years/pre: Handicapped height Bathroom Accessibility: Yes   Home Equipment: Walker - standard;Cane - single point;Shower seat - built in;Bedside commode;Grab bars - tub/shower;Grab bars - toilet;Hand held shower head;Wheelchair - manual          Prior Functioning/Environment Level of Independence: Independent with assistive device(s)        Comments: Pt ambualtes with cane at all times.  Pt independent with bathing, dressing.  Wife does cooking, cleaning.  No falls in the past 6 months.         OT Problem List:        OT Treatment/Interventions:      OT Goals(Current goals can be found in the care plan section) Acute Rehab OT Goals Patient Stated Goal: to return home OT Goal Formulation: All assessment and education complete, DC therapy  OT Frequency:     Barriers to D/C:            Co-evaluation              AM-PAC PT "6 Clicks" Daily Activity     Outcome Measure Help from another person eating meals?: None Help from another person taking care of personal grooming?: None Help from another person toileting, which includes using toliet, bedpan, or urinal?: A Little Help from another person bathing (including washing, rinsing, drying)?: A Little Help from another person to put on and taking off regular upper body clothing?: None Help from another person to put on and taking off regular lower body clothing?: A Little 6 Click Score: 21   End of Session    Activity Tolerance: Patient tolerated treatment well Patient left: in chair;with call bell/phone within reach;with chair alarm set;with family/visitor present  OT Visit Diagnosis: Other abnormalities of gait and mobility (R26.89)                Time: 3532-9924 OT Time Calculation (min): 16 min Charges:  OT  General Charges $OT Visit: 1 Visit OT Evaluation $OT Eval Low Complexity: 1 Low  Jeni Salles, MPH, MS, OTR/L ascom [phone] 11/12/17, 3:22 PM'

## 2017-11-12 NOTE — Progress Notes (Signed)
OT Cancellation Note  Patient Details Name: Lee Mercado MRN: [id number] DOB: 06-23-1925   Cancelled Treatment:    Reason Eval/Treat Not Completed: Patient at procedure or test/ unavailable. Order received, chart reviewed. Pt with MD for assessment upon attempt. Will re-attempt OT evaluation at later date/time as pt is available and medically appropriate.  Jeni Salles, MPH, MS, OTR/L ascom [phone] 11/12/17, 10:49 AM

## 2017-11-12 NOTE — Progress Notes (Signed)
[facility] NAME: Lee Mercado    MR#:  [id number]  DATE OF BIRTH:  [birth year]  SUBJECTIVE:  CHIEF COMPLAINT:   Chief Complaint  Patient presents with  . Altered Mental Status  Denies any complaint REVIEW OF SYSTEMS:  Review of Systems  Constitutional: Negative for chills, fever and weight loss.  HENT: Negative for nosebleeds and sore throat.   Eyes: Negative for blurred vision.  Respiratory: Negative for cough, shortness of breath and wheezing.   Cardiovascular: Negative for chest pain, orthopnea, leg swelling and PND.  Gastrointestinal: Negative for abdominal pain, constipation, diarrhea, heartburn, nausea and vomiting.  Genitourinary: Negative for dysuria and urgency.  Musculoskeletal: Negative for back pain.  Skin: Negative for rash.  Neurological: Negative for dizziness, speech change, focal weakness and headaches.  Endo/Heme/Allergies: Does not bruise/bleed easily.  Psychiatric/Behavioral: Negative for depression.    DRUG ALLERGIES:  No Known Allergies VITALS:  Blood pressure (!) 103/53, pulse (!) 59, temperature (!) 97.4 F (36.3 C), temperature source Oral, resp. rate 16, height 5' 11.5" (1.816 m), weight 84.3 kg (185 lb 12.8 oz), SpO2 100 %. PHYSICAL EXAMINATION:  Physical Exam  Constitutional: He is oriented to person, place, and time and well-developed, well-nourished, and in no distress.  HENT:  Head: Normocephalic and atraumatic.  Eyes: Conjunctivae and EOM are normal. Pupils are equal, round, and reactive to light.  Neck: Normal range of motion. Neck supple. No tracheal deviation present. No thyromegaly present.  Cardiovascular: Normal rate, regular rhythm and normal heart sounds.  Pulmonary/Chest: Effort normal and breath sounds normal. No respiratory distress. He has no wheezes. He exhibits no tenderness.  Abdominal: Soft. Bowel sounds are normal. He exhibits no distension. There is no tenderness.   Musculoskeletal: Normal range of motion.  Neurological: He is alert and oriented to person, place, and time. No cranial nerve deficit.  Skin: Skin is warm and dry. No rash noted.  Psychiatric: Mood and affect normal.   LABORATORY PANEL:  Male CBC Recent Labs  Lab 11/11/17 1530  WBC 5.7  HGB 11.3*  HCT 32.4*  PLT 160   ------------------------------------------------------------------------------------------------------------------ Chemistries  Recent Labs  Lab 11/11/17 1530  NA 137  K 4.0  CL 105  CO2 26  GLUCOSE 104*  BUN 26*  CREATININE 1.41*  CALCIUM 8.8*  AST 18  ALT 9*  ALKPHOS 60  BILITOT 1.2   RADIOLOGY:  Mr Brain Wo Contrast  Result Date: 11/12/2017 CLINICAL DATA:  Dizziness today, speech difficulties and confusion. History of stroke, atrial fibrillation, hypertension, hyperlipidemia, dizziness. EXAM: MRI HEAD WITHOUT CONTRAST MRA HEAD WITHOUT CONTRAST TECHNIQUE: Multiplanar, multiecho pulse sequences of the brain and surrounding structures were obtained without intravenous contrast. Angiographic images of the head were obtained using MRA technique without contrast. COMPARISON:  CT HEAD November 11, 2017 FINDINGS: MRI HEAD FINDINGS INTRACRANIAL CONTENTS: No reduced diffusion to suggest acute ischemia. No susceptibility artifact to suggest hemorrhage. The ventricles and sulci are normal for patient's age. Prominent basal ganglia perivascular spaces associated with chronic small vessel ischemic disease. No suspicious parenchymal signal, masses, mass effect. No abnormal extra-axial fluid collections. Multiple old small cerebellar infarcts. Patchy to confluent supratentorial white matter FLAIR T2 hyperintensities. No extra-axial masses. VASCULAR: Normal major intracranial vascular flow voids present at skull base. SKULL AND UPPER CERVICAL SPINE: No abnormal sellar expansion. No suspicious calvarial bone marrow signal. Craniocervical junction maintained. SINUSES/ORBITS:  Trace paranasal sinus mucosal thickening without air-fluid  levels. Trace RIGHT mastoid effusion. Included ocular globes and orbital contents are non-suspicious. OTHER: None. MRA HEAD FINDINGS ANTERIOR CIRCULATION: Normal flow related enhancement of the included cervical, petrous, cavernous and supraclinoid internal carotid arteries. Patent anterior communicating artery. Patent anterior and middle cerebral arteries, including distal segments. No large vessel occlusion, flow limiting stenosis, aneurysm. POSTERIOR CIRCULATION: LEFT vertebral artery is dominant. RIGHT vertebral artery terminates in the posterior inferior cerebellar artery. Basilar artery is patent, with normal flow related enhancement of the main branch vessels. Patent posterior cerebral arteries. Fetal origin RIGHT posterior cerebral artery with slower flow. Small LEFT posterior communicating artery present. No large vessel occlusion, flow limiting stenosis,  aneurysm. ANATOMIC VARIANTS: None. Source images and MIP images were reviewed. IMPRESSION: MRI HEAD: 1. No acute intracranial process. 2. Multiple old cerebellar infarcts. 3. Moderate chronic small vessel ischemic disease. MRA HEAD: 1. No emergent large vessel occlusion or flow limiting stenosis. Electronically Signed   By: Elon Alas M.D.   On: 11/12/2017 02:11   US Carotid Bilateral (at [facility] Only)  Result Date: 11/12/2017 CLINICAL DATA:  CVA EXAM: BILATERAL CAROTID DUPLEX ULTRASOUND TECHNIQUE: Pearline Cables scale imaging, color Doppler and duplex ultrasound were performed of bilateral carotid and vertebral arteries in the neck. COMPARISON:  None. FINDINGS: Criteria: Quantification of carotid stenosis is based on velocity parameters that correlate the residual internal carotid diameter with NASCET-based stenosis levels, using the diameter of the distal internal carotid lumen as the denominator for stenosis measurement. The following velocity measurements were obtained: RIGHT ICA:  217  cm/sec CCA:  71 cm/sec SYSTOLIC ICA/CCA RATIO:  3.1 DIASTOLIC ICA/CCA RATIO:  5.4 ECA:  170 cm/sec LEFT ICA:  116 cm/sec CCA:  086 cm/sec SYSTOLIC ICA/CCA RATIO:  1.1 DIASTOLIC ICA/CCA RATIO:  2.1 ECA:  111 cm/sec RIGHT CAROTID ARTERY: Moderate irregular mixed plaque in the bulb. Low resistance internal carotid Doppler pattern. RIGHT VERTEBRAL ARTERY:  Antegrade. LEFT CAROTID ARTERY: Moderate mixed plaque in the bulb. Low resistance internal carotid Doppler pattern. LEFT VERTEBRAL ARTERY:  Antegrade. 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 Less than 50% stenosis in the left internal carotid artery. Electronically Signed   By: Marybelle Killings M.D.   On: 11/12/2017 10:57   Mr Jodene Nam Head/brain VH Cm  Result Date: 11/12/2017 CLINICAL DATA:  Dizziness today, speech difficulties and confusion. History of stroke, atrial fibrillation, hypertension, hyperlipidemia, dizziness. EXAM: MRI HEAD WITHOUT CONTRAST MRA HEAD WITHOUT CONTRAST TECHNIQUE: Multiplanar, multiecho pulse sequences of the brain and surrounding structures were obtained without intravenous contrast. Angiographic images of the head were obtained using MRA technique without contrast. COMPARISON:  CT HEAD November 11, 2017 FINDINGS: MRI HEAD FINDINGS INTRACRANIAL CONTENTS: No reduced diffusion to suggest acute ischemia. No susceptibility artifact to suggest hemorrhage. The ventricles and sulci are normal for patient's age. Prominent basal ganglia perivascular spaces associated with chronic small vessel ischemic disease. No suspicious parenchymal signal, masses, mass effect. No abnormal extra-axial fluid collections. Multiple old small cerebellar infarcts. Patchy to confluent supratentorial white matter FLAIR T2 hyperintensities. No extra-axial masses. VASCULAR: Normal major intracranial vascular flow voids present at skull base. SKULL AND UPPER CERVICAL SPINE: No abnormal sellar expansion. No suspicious calvarial bone marrow signal.  Craniocervical junction maintained. SINUSES/ORBITS: Trace paranasal sinus mucosal thickening without air-fluid levels. Trace RIGHT mastoid effusion. Included ocular globes and orbital contents are non-suspicious. OTHER: None. MRA HEAD FINDINGS ANTERIOR CIRCULATION: Normal flow related enhancement of the included cervical, petrous, cavernous and supraclinoid internal carotid arteries. Patent anterior communicating artery. Patent  anterior and middle cerebral arteries, including distal segments. No large vessel occlusion, flow limiting stenosis, aneurysm. POSTERIOR CIRCULATION: LEFT vertebral artery is dominant. RIGHT vertebral artery terminates in the posterior inferior cerebellar artery. Basilar artery is patent, with normal flow related enhancement of the main branch vessels. Patent posterior cerebral arteries. Fetal origin RIGHT posterior cerebral artery with slower flow. Small LEFT posterior communicating artery present. No large vessel occlusion, flow limiting stenosis,  aneurysm. ANATOMIC VARIANTS: None. Source images and MIP images were reviewed. IMPRESSION: MRI HEAD: 1. No acute intracranial process. 2. Multiple old cerebellar infarcts. 3. Moderate chronic small vessel ischemic disease. MRA HEAD: 1. No emergent large vessel occlusion or flow limiting stenosis. Electronically Signed   By: Elon Alas M.D.   On: 11/12/2017 02:11   ASSESSMENT AND PLAN:  Patient is a 84 year old white male with previous history of CVA and atrial fibrillation on statin, aspirin, Eliquis is presenting with altered mental status and difficulty with speech, vertigo  1.  Acute metabolic encephalopathy: Cannot rule out CVA/TIA - Appreciate neurology input - can not rule out a MR negative infarct and/or seizure -Getting EEG tomorrow per neurology MRI/MRA of the brain neg for acute pathology Carotid Doppler shows 50-69% RICA stenosis and no evidence of hemodynamically significant LICA stenosis Echo normal Continue aspirin  and Eliquis Continue Pravachol  2.  History of atrial fibrillation continue Eliquis and metoprolol  3.  Essential hypertension continue metoprolol  4.  Hyperlipidemia unspecified continue Pravachol  5.  Anemia of chronic disease       All the records are reviewed and case discussed with Care Management/Social Worker. Management plans discussed with the patient, nursing and they are in agreement.  CODE STATUS: Full Code  TOTAL TIME TAKING CARE OF THIS PATIENT: 15 minutes.   More than 50% of the time was spent in counseling/coordination of care: YES  POSSIBLE D/C IN 1 DAYS, DEPENDING ON CLINICAL CONDITION.   Max Sane M.D on 11/12/2017 at 5:24 PM  Between 7am to 6pm - Pager - [phone]  After 6pm go to [email or web address] - Proofreader  [facility]  Office  [phone]  CC: Primary care physician; Adline Potter, MD  Note: This dictation was prepared with Dragon dictation along with smaller phrase technology. Any transcriptional errors that result from this process are unintentional.

## 2017-11-12 NOTE — Progress Notes (Signed)
Family Meeting Note  Advance Directive:yes  Today a meeting took place with the Patient.  The following clinical team members were present during this meeting:MD  The following were discussed:Patient's diagnosis:   84 y.o. male with a history of prior stroke and atrial fibrillation on a statin, ASA and Eliquis admitted for intermittent speech deficits and vertigo. Work up thus far is neg.  Patient's progosis: < 12 months and Goals for treatment: DNR  Additional follow-up to be provided: Outpt Palliative care eval  Time spent during discussion:20 minutes  Max Sane, MD

## 2017-11-12 NOTE — Care Management Obs Status (Signed)
[facility] NOTIFICATION   Patient Details  Name: Kaveh Kissinger MRN: [id number] Date of Birth: 05/11/1925   Medicare Observation Status Notification Given:  Yes  but no signature.  Patient is confused.  left notice in room with CM contact information.  Left VM message for son Mathis Bud 11/12/2017, 4:11 PM

## 2017-11-12 NOTE — Progress Notes (Addendum)
SLP Cancellation Note  Patient Details Name: Lee Mercado MRN: [id number] DOB: 1925/07/26   Cancelled treatment:       Reason Eval/Treat Not Completed: SLP screened, no needs identified, will sign off(chart reviewed; consulted NSG then met w/ pt briefly). Upon meeting briefly w/ pt earlier this morning, pt denied any difficulty swallowing and is currently on a regular diet; tolerates swallowing pills w/ water per NSG. Pt conversed at conversational level w/ PT during session w/out gross deficits noted; pt also verbally communicated w/ OT during that session and per OT report pt "verbalizes good awareness of activity tolerance and need for activity pacing based on how he's feeling". Family was in room and corroborate pt's responses. Pt does appear to be verbally making wants/needs known appropriately. NSG agreed.  No further skilled ST services indicated as pt appears close to/at his baseline. MRI indicated NO Acute intracranial process. Recommend f/u w/ Neurologist post discharge if felt pt is not back at his baseline Cognitively. Pt agreed this morning(currently out of room now). NSG to reconsult if any change in status while admitted.    [name], MS, CCC-SLP [facility] 11/12/2017, 5:23 PM

## 2017-11-12 NOTE — Evaluation (Signed)
Physical Therapy Evaluation Patient Details Name: Lee Mercado MRN: [id number] DOB: 03-31-1925 Today's Date: 11/12/2017   History of Present Illness  Pt is a 85 y/o M who presented with c/o difficulty with speech and confusion.  Once pt arrived at the ED the symptoms seemed to have mostly resolved.  CT scan of the head was negative.  Pt's PMH includes skin cancer, heat stroke, stroke, vertigo, back surgery, R TKA.     Clinical Impression  Pt admitted with above diagnosis. Pt currently with functional limitations due to the deficits listed below (see PT Problem List). Unsure of pt's baseline strength but pt presents with slight weakness in LUE and LLE with impaired coordination when tested grossly LLE.  Pt appears to be at his baseline level of mobility with bed mobility, transfers, and ambulation.  This PT provided 1 person HHA for transfers and to ambulate in lieu of [facility] which pt uses at baseline.  Pt reports h/o vertigo since prior strokes and would benefit from vestibular rehab in the OPPT setting. Pt will benefit from skilled PT to increase their independence and safety with mobility to allow discharge to the venue listed below.      Follow Up Recommendations Outpatient PT(to address vertigo and balance deficits)    Equipment Recommendations  None recommended by PT    Recommendations for Other Services       Precautions / Restrictions Precautions Precautions: Fall Restrictions Weight Bearing Restrictions: No      Mobility  Bed Mobility Overal bed mobility: Modified Independent             General bed mobility comments: No physical assist or cues needed.  Pt performs independently.   Transfers Overall transfer level: Needs assistance Equipment used: 1 person hand held assist Transfers: Sit to/from Stand Sit to Stand: Min assist         General transfer comment: This PT served as cane in R hand by providing 1 person HHA (pt uses SPC at baseline).  Pt slow to stand but  remains steady.   Ambulation/Gait Ambulation/Gait assistance: Min assist Ambulation Distance (Feet): 50 Feet Assistive device: 1 person hand held assist Gait Pattern/deviations: Step-through pattern;Decreased stride length;Antalgic;Trunk flexed   Gait velocity interpretation: at or above normal speed for age/gender General Gait Details: Pt with flexed posture that does not improve with verbal cues.  This PT provided 1 person HHA and pt remained steady throughout.   Stairs            Wheelchair Mobility    Modified Rankin (Stroke Patients Only)       Balance Overall balance assessment: Needs assistance Sitting-balance support: No upper extremity supported;Feet supported Sitting balance-Leahy Scale: Good     Standing balance support: Single extremity supported;During functional activity Standing balance-Leahy Scale: Poor Standing balance comment: Relies on UE support for static and dynamic activities                             Pertinent Vitals/Pain Pain Assessment: No/denies pain    Home Living Family/patient expects to be discharged to:: Assisted living([facility]) Living Arrangements: Spouse/significant other Available Help at Discharge: Family;Available 24 hours/day Type of Home: Assisted living Home Access: Level entry     Home Layout: One level Home Equipment: Walker - standard;Cane - single point;Shower seat - built in;Bedside commode;Grab bars - tub/shower;Grab bars - toilet;Hand held shower head;Wheelchair - manual      Prior Function Level of Independence:  Independent with assistive device(s)         Comments: Pt ambualtes with cane at all times.  Pt independent with bathing, dressing.  Wife does cooking, cleaning.  No falls in the past 6 months.      Hand Dominance   Dominant Hand: Right    Extremity/Trunk Assessment   Upper Extremity Assessment Upper Extremity Assessment: LUE deficits/detail;RUE deficits/detail RUE Deficits /  Details: RUE strength grossly 4/5 LUE Deficits / Details: LUE strength grossly 4-/5    Lower Extremity Assessment Lower Extremity Assessment: LLE deficits/detail;RLE deficits/detail RLE Deficits / Details: RLE strength grossly 4/5 LLE Deficits / Details: LLE strength grossly 4-/5 LLE Coordination: decreased gross motor       Communication   Communication: No difficulties  Cognition Arousal/Alertness: Awake/alert Behavior During Therapy: WFL for tasks assessed/performed Overall Cognitive Status: Within Functional Limits for tasks assessed                                        General Comments General comments (skin integrity, edema, etc.): Pt reports h/o vertigo ever since his previous strokes.  He says it is worse when he does not get enough sleep.  Pt reports that if he watches TV too long his vision gets blurry.  He feels dizzy at times when looking at things moving fast.     Exercises     Assessment/Plan    PT Assessment Patient needs continued PT services  PT Problem List Decreased strength;Decreased balance;Decreased safety awareness       PT Treatment Interventions DME instruction;Gait training;Stair training;Functional mobility training;Therapeutic activities;Therapeutic exercise;Balance training;Neuromuscular re-education;Patient/family education    PT Goals (Current goals can be found in the Care Plan section)  Acute Rehab PT Goals Patient Stated Goal: to return home PT Goal Formulation: With patient Time For Goal Achievement: 11/26/17 Potential to Achieve Goals: Good    Frequency Min 2X/week   Barriers to discharge        Co-evaluation               AM-PAC PT "6 Clicks" Daily Activity  Outcome Measure Difficulty turning over in bed (including adjusting bedclothes, sheets and blankets)?: None Difficulty moving from lying on back to sitting on the side of the bed? : None Difficulty sitting down on and standing up from a chair with  arms (e.g., wheelchair, bedside commode, etc,.)?: A Little Help needed moving to and from a bed to chair (including a wheelchair)?: A Little Help needed walking in hospital room?: A Little Help needed climbing 3-5 steps with a railing? : A Lot 6 Click Score: 19    End of Session Equipment Utilized During Treatment: Gait belt Activity Tolerance: Patient tolerated treatment well Patient left: in chair;with call bell/phone within reach;with chair alarm set Nurse Communication: Mobility status PT Visit Diagnosis: Unsteadiness on feet (R26.81);Muscle weakness (generalized) (M62.81);Difficulty in walking, not elsewhere classified (R26.2)    Time: 1131-1200 PT Time Calculation (min) (ACUTE ONLY): 29 min   Charges:   PT Evaluation $PT Eval Low Complexity: 1 Low PT Treatments $Gait Training: 8-22 mins   PT G Codes:        Collie Siad PT, DPT 11/12/2017, 1:04 PM

## 2017-11-13 LAB — CBC
HEMATOCRIT: 31.7 % — AB (ref 40.0–52.0)
HEMOGLOBIN: 10.8 g/dL — AB (ref 13.0–18.0)
MCH: 43.1 pg — ABNORMAL HIGH (ref 26.0–34.0)
MCHC: 34.1 g/dL (ref 32.0–36.0)
MCV: 126.2 fL — ABNORMAL HIGH (ref 80.0–100.0)
Platelets: 159 10*3/uL (ref 150–440)
RBC: 2.51 MIL/uL — AB (ref 4.40–5.90)
RDW: 21.9 % — AB (ref 11.5–14.5)
WBC: 5 10*3/uL (ref 3.8–10.6)

## 2017-11-13 LAB — BASIC METABOLIC PANEL
ANION GAP: 9 (ref 5–15)
BUN: 29 mg/dL — ABNORMAL HIGH (ref 6–20)
CALCIUM: 8.9 mg/dL (ref 8.9–10.3)
CO2: 26 mmol/L (ref 22–32)
Chloride: 106 mmol/L (ref 101–111)
Creatinine, Ser: 1.45 mg/dL — ABNORMAL HIGH (ref 0.61–1.24)
GFR calc non Af Amer: 41 mL/min — ABNORMAL LOW (ref >60)
GFR, EST AFRICAN AMERICAN: 47 mL/min — AB (ref >60)
Glucose, Bld: 104 mg/dL — ABNORMAL HIGH (ref 65–99)
POTASSIUM: 4.1 mmol/L (ref 3.5–5.1)
Sodium: 141 mmol/L (ref 135–145)

## 2017-11-13 NOTE — Progress Notes (Signed)
A & O. Room air. NSR. Takes meds ok. Up to chair and tolerated well. Pt ambulated on room air and was 90%. No pain. IV and tele removed. Discharge instructions given to pt. Daughter taken home. Pt has no further concerns at this time.

## 2017-11-13 NOTE — Plan of Care (Signed)
  Progressing Clinical Measurements: Diagnostic test results will improve 11/13/2017 0228 - Progressing by Jeri Cos, RN

## 2017-11-13 NOTE — Progress Notes (Signed)
Physical Therapy Treatment Patient Details Name: Lee Mercado MRN: [id number] DOB: 1925-05-23 Today's Date: 11/13/2017    History of Present Illness Pt is a 84 y/o M who presented with c/o difficulty with speech and confusion.  Once pt arrived at the ED the symptoms seemed to have mostly resolved.  CT scan of the head was negative.  Pt's PMH includes skin cancer, heat stroke, stroke, vertigo, back surgery, R TKA.     PT Comments    Pt agrees to gait with encouragement.  Brought SPC for pt to use this am but he stated he prefers walker at this time.  Stated he has both at home and he plans to use RW upon discharge.  He was able to stand and ambulate around unit x 1 with walker and min guard.  Posture remains flexed despite verbal cues stating it is his baseline.  He declines further exercises at this time.  Reports general fatigue in arms after gait.     Follow Up Recommendations  Outpatient PT     Equipment Recommendations  None recommended by PT    Recommendations for Other Services       Precautions / Restrictions Precautions Precautions: Fall Restrictions Weight Bearing Restrictions: No    Mobility  Bed Mobility               General bed mobility comments: in recliner  Transfers   Equipment used: Rolling walker (2 wheeled) Transfers: Sit to/from Stand Sit to Stand: Min guard            Ambulation/Gait   Ambulation Distance (Feet): 160 Feet Assistive device: Rolling walker (2 wheeled) Gait Pattern/deviations: Step-through pattern;Decreased step length - right;Decreased step length - left;Trunk flexed   Gait velocity interpretation: at or above normal speed for age/gender General Gait Details: felxed posture which he stated is his baseline, prior back surgery.   Stairs            Wheelchair Mobility    Modified Rankin (Stroke Patients Only)       Balance Overall balance assessment: Needs assistance Sitting-balance support: No upper  extremity supported;Feet supported Sitting balance-Leahy Scale: Good     Standing balance support: Bilateral upper extremity supported Standing balance-Leahy Scale: Poor Standing balance comment: Relies on UE support for static and dynamic activities                            Cognition Arousal/Alertness: Awake/alert Behavior During Therapy: WFL for tasks assessed/performed Overall Cognitive Status: Within Functional Limits for tasks assessed                                        Exercises      General Comments        Pertinent Vitals/Pain Pain Assessment: 0-10 Pain Score: 2  Pain Location:  back - chronic Pain Descriptors / Indicators: Sore Pain Intervention(s): Limited activity within patient's tolerance    Home Living                      Prior Function            PT Goals (current goals can now be found in the care plan section) Progress towards PT goals: Progressing toward goals    Frequency    Min 2X/week      PT Plan Current plan  remains appropriate    Co-evaluation              AM-PAC PT "6 Clicks" Daily Activity  Outcome Measure  Difficulty turning over in bed (including adjusting bedclothes, sheets and blankets)?: None Difficulty moving from lying on back to sitting on the side of the bed? : None Difficulty sitting down on and standing up from a chair with arms (e.g., wheelchair, bedside commode, etc,.)?: A Little Help needed moving to and from a bed to chair (including a wheelchair)?: A Little Help needed walking in hospital room?: A Little Help needed climbing 3-5 steps with a railing? : A Lot 6 Click Score: 19    End of Session Equipment Utilized During Treatment: Gait belt Activity Tolerance: Patient tolerated treatment well Patient left: in chair;with chair alarm set;with call bell/phone within reach         Time: 0851-0903 PT Time Calculation (min) (ACUTE ONLY): 12 min  Charges:  $Gait  Training: 8-22 mins                    G Codes:       Chesley Noon, PTA 11/13/17, 9:13 AM

## 2017-11-14 ENCOUNTER — Telehealth: Payer: Self-pay

## 2017-11-14 NOTE — Telephone Encounter (Signed)
Transition Care Management Follow-Up Telephone Call   Date discharged and where: 11/13/17 from [facility]   How have you been since you were released from the hospital? No slurred speech noted throughout our phone conversation. Denies feeling dizzy or weak. A&Ox3. States he is feeling "much better".  Any patient concerns? Denies any questions or concerns at this time.  Items Reviewed:   Meds: Verified  Allergies: Verified  Dietary Changes Reviewed: Pt states he is following a low sodium diet. Verbalized understanding of dietary restrictions and eliminations.   Functional Questionnaire:  Independent = I Dependent = D  ADLs: I   Dressing-  I    Eating-  I   Maintaining continence-  I   Transferring-  I   Transportation-  D, states dtr transports to all appts   Meal Prep-  I   Managing Meds-  I  Confirmed importance and Date/Time of follow-up visits scheduled: Confirmed f/u appt with Dr. Vicente Masson on 2/21/9 @ 9:30am. Denies needing transportation arrangements for this appt.  Pt understands he is to schedule a f/u appt with neurology. Confirmed he has not yet scheduled this appt with Dr. Manuella Ghazi. States his dtr will call to schedule appt to ensure she is able to transport to appt.  Confirmed with patient if condition worsens to call PCP or go to the Emergency Dept. Patient was given office number and encouraged to call back with questions or concerns: Verbalized acceptance and understanding

## 2017-11-14 NOTE — Telephone Encounter (Signed)
See  Below note from [place]

## 2017-11-16 NOTE — Discharge Summary (Signed)
[facility] NAME: Lee Mercado    MR#:  [id number]  DATE OF BIRTH:  [birth year]  DATE OF ADMISSION:  11/11/2017   ADMITTING PHYSICIAN: Dustin Flock, MD  DATE OF DISCHARGE: 11/13/2017 12:08 PM  PRIMARY CARE PHYSICIAN: Adline Potter, MD   ADMISSION DIAGNOSIS:  CVA (cerebral vascular accident) ([place]) [I63.9] Altered mental status, unspecified altered mental status type [R41.82] Cerebrovascular accident (CVA), unspecified mechanism ([place]) [I63.9] DISCHARGE DIAGNOSIS:  Active Problems:   TIA (transient ischemic attack)   Acute metabolic encephalopathy  SECONDARY DIAGNOSIS:   Past Medical History:  Diagnosis Date  . A-fib ([place])   . Cancer ([place])    skin cancers  . Coagulation defect ([place])   . Finger injury   . History of heat stroke   . Hyperlipidemia   . Hypertension   . Previous back surgery   . Stroke ([place])   . Vertigo    HOSPITAL COURSE:  Patient is a 84 year old white male with previous history of CVA and atrial fibrillation on statin, aspirin, Eliquis admitted for altered mental status and difficulty with speech, vertigo  1.Acute metabolic encephalopathy:  - can not rule out a MR negative infarct and/or seizure per neurology - EEG neg MRI/MRA of the brain neg for acute pathology Carotid Doppler shows 50-69% RICA stenosis and no evidence of hemodynamically significant LICA stenosis Echo normal Continue aspirin and Eliquis Continue Pravachol  2.History of atrial fibrillation continue Eliquis for anticoagulation and metoprolol for rate control  3.Essential hypertension continue metoprolol  4.Hyperlipidemia - continue Pravachol  5.Anemia of chronic disease DISCHARGE CONDITIONS:  stable CONSULTS OBTAINED:  Treatment Team:  Alexis Goodell, MD DRUG ALLERGIES:  No Known Allergies DISCHARGE MEDICATIONS:   Allergies as of 11/13/2017   No Known Allergies     Medication List    TAKE these  medications   aspirin EC 81 MG tablet Take 81 mg daily by mouth.   cyanocobalamin 1000 MCG tablet Take 1,000 mcg daily by mouth.   diclofenac sodium 1 % Gel Commonly known as:  VOLTAREN Apply topically 4 (four) times daily as needed.   ELIQUIS 5 MG Tabs tablet Generic drug:  apixaban Take 5 mg 2 (two) times daily by mouth.   hydroxyurea 500 MG capsule Commonly known as:  HYDREA Take 1,000 mg daily by mouth. May take with food to minimize GI side effects.   meclizine 25 MG tablet Commonly known as:  ANTIVERT Take 25 mg by mouth daily.   metoprolol tartrate 25 MG tablet Commonly known as:  LOPRESSOR Take 12.5 mg 2 (two) times daily by mouth.   multivitamin tablet Take 1 tablet daily by mouth.   pravastatin 20 MG tablet Commonly known as:  PRAVACHOL Take 20 mg daily by mouth.        DISCHARGE INSTRUCTIONS:   DIET:  Regular diet DISCHARGE CONDITION:  Good ACTIVITY:  Activity as tolerated OXYGEN:  Home Oxygen: No.  Oxygen Delivery: room air DISCHARGE LOCATION:  home   If you experience worsening of your admission symptoms, develop shortness of breath, life threatening emergency, suicidal or homicidal thoughts you must seek medical attention immediately by calling 911 or calling your MD immediately  if symptoms less severe.  You Must read complete instructions/literature along with all the possible adverse reactions/side effects for all the Medicines you take and that have been prescribed to you. Take any new Medicines after you have completely understood and accpet all the possible adverse reactions/side effects.  Please note  You were cared for by a hospitalist during your hospital stay. If you have any questions about your discharge medications or the care you received while you were in the hospital after you are discharged, you can call the unit and asked to speak with the hospitalist on call if the hospitalist that took care of you is not available. Once you  are discharged, your primary care physician will handle any further medical issues. Please note that NO REFILLS for any discharge medications will be authorized once you are discharged, as it is imperative that you return to your primary care physician (or establish a relationship with a primary care physician if you do not have one) for your aftercare needs so that they can reassess your need for medications and monitor your lab values.    On the day of Discharge:  VITAL SIGNS:  Blood pressure 132/62, pulse 60, temperature (!) 97.5 F (36.4 C), temperature source Oral, resp. rate 16, height 5' 11.5" (1.816 m), weight 84.3 kg (185 lb 12.8 oz), SpO2 90 %. PHYSICAL EXAMINATION:  GENERAL:  84 y.o.-year-old patient lying in the bed with no acute distress.  EYES: Pupils equal, round, reactive to light and accommodation. No scleral icterus. Extraocular muscles intact.  HEENT: Head atraumatic, normocephalic. Oropharynx and nasopharynx clear.  NECK:  Supple, no jugular venous distention. No thyroid enlargement, no tenderness.  LUNGS: Normal breath sounds bilaterally, no wheezing, rales,rhonchi or crepitation. No use of accessory muscles of respiration.  CARDIOVASCULAR: S1, S2 normal. No murmurs, rubs, or gallops.  ABDOMEN: Soft, non-tender, non-distended. Bowel sounds present. No organomegaly or mass.  EXTREMITIES: No pedal edema, cyanosis, or clubbing.  NEUROLOGIC: Cranial nerves II through XII are intact. Muscle strength 5/5 in all extremities. Sensation intact. Gait not checked.  PSYCHIATRIC: The patient is alert and oriented x 3.  SKIN: No obvious rash, lesion, or ulcer.  DATA REVIEW:   CBC Recent Labs  Lab 11/13/17 0435  WBC 5.0  HGB 10.8*  HCT 31.7*  PLT 159    Chemistries  Recent Labs  Lab 11/11/17 1530 11/13/17 0435  NA 137 141  K 4.0 4.1  CL 105 106  CO2 26 26  GLUCOSE 104* 104*  BUN 26* 29*  CREATININE 1.41* 1.45*  CALCIUM 8.8* 8.9  AST 18  --   ALT 9*  --   ALKPHOS  60  --   BILITOT 1.2  --      Follow-up Information    Adline Potter, MD. Go on 11/20/2017.   Specialties:  Family Medicine, Geriatric Medicine Why:  Appointment Time: 9:30am Contact information: [address] [place] [address] [phone]        Vladimir Crofts, MD. Go on 12/11/2017.   Specialty:  Neurology Why:  Appointment Time: 10:30am Contact information: [facility] [place] [facility] [place] [address] [phone]           Management plans discussed with the patient, family and they are in agreement.  CODE STATUS: Prior   TOTAL TIME TAKING CARE OF THIS PATIENT: 45 minutes.    Max Sane M.D on 11/16/2017 at 2:13 PM  Between 7am to 6pm - Pager - [phone]  After 6pm go to [email or web address] - Proofreader  [facility]  Office  [phone]  CC: Primary care physician; Adline Potter, MD   Note: This dictation was prepared with Dragon dictation along with smaller phrase technology. Any transcriptional errors that result from this process  are unintentional.

## 2017-11-17 ENCOUNTER — Telehealth: Payer: Self-pay | Admitting: Family Medicine

## 2017-11-17 NOTE — Telephone Encounter (Signed)
Called to schedule AWV with Nurse Health Advisor. LM with Daughter and with Mr. Ebron. Children have to arrange transportation for Mr. And Mrs. Bumgarner sched together [phone] Jill Alexanders [phone]  Skype [email or web address]

## 2017-11-20 ENCOUNTER — Encounter: Payer: Self-pay | Admitting: Family Medicine

## 2017-11-20 ENCOUNTER — Ambulatory Visit (INDEPENDENT_AMBULATORY_CARE_PROVIDER_SITE_OTHER): Payer: Medicare HMO | Admitting: Family Medicine

## 2017-11-20 VITALS — BP 97/68 | HR 62 | Resp 16 | Ht 71.5 in | Wt 192.0 lb

## 2017-11-20 DIAGNOSIS — R42 Dizziness and giddiness: Secondary | ICD-10-CM

## 2017-11-20 DIAGNOSIS — I480 Paroxysmal atrial fibrillation: Secondary | ICD-10-CM

## 2017-11-20 DIAGNOSIS — R2681 Unsteadiness on feet: Secondary | ICD-10-CM

## 2017-11-20 DIAGNOSIS — G459 Transient cerebral ischemic attack, unspecified: Secondary | ICD-10-CM

## 2017-11-20 DIAGNOSIS — E538 Deficiency of other specified B group vitamins: Secondary | ICD-10-CM

## 2017-11-20 DIAGNOSIS — N183 Chronic kidney disease, stage 3 unspecified: Secondary | ICD-10-CM

## 2017-11-20 DIAGNOSIS — D539 Nutritional anemia, unspecified: Secondary | ICD-10-CM | POA: Diagnosis not present

## 2017-11-20 DIAGNOSIS — R7303 Prediabetes: Secondary | ICD-10-CM | POA: Insufficient documentation

## 2017-11-20 DIAGNOSIS — G5603 Carpal tunnel syndrome, bilateral upper limbs: Secondary | ICD-10-CM

## 2017-11-20 DIAGNOSIS — D473 Essential (hemorrhagic) thrombocythemia: Secondary | ICD-10-CM

## 2017-11-20 DIAGNOSIS — D75839 Thrombocytosis, unspecified: Secondary | ICD-10-CM

## 2017-11-20 DIAGNOSIS — E785 Hyperlipidemia, unspecified: Secondary | ICD-10-CM | POA: Diagnosis not present

## 2017-11-20 NOTE — Progress Notes (Signed)
Date:  11/20/2017   Name:  Lee Mercado   DOB:  12-Dec-1924   MRN:  [id number]  PCP:  Adline Potter, MD    Chief Complaint: Hospitalization Follow-up ( Altered Mental Status/CVA?Will see Neurology December 08 2017 but they said no CVA at hospital so can he cancel Neuro? )   History of Present Illness:  This is a 84 y.o. male seen in one week f/u from hospitalization for TIA. Echo/dopplers/EEG ok, MRI showed old cerebellar infarcts and mod chronic ischemic dz. Macrocytic anemia noted, Hgb decreased from 14.9 in Dec to 10.8, CKD3 stable, LDL 65, a1c 5.7%. No changes in meds recommended. Taking meclizine daily in am for dizziness, helps. Family now helping set up meds, wonder if pt would benefit from walker. Occ BLE edema improves overnight. Taking fish oil daily and extra asa prn pain. B CTS still bothers some, has not tried wrist splints.  Review of Systems:  Review of Systems  Constitutional: Negative for chills and fever.  Respiratory: Negative for cough and shortness of breath.   Cardiovascular: Negative for chest pain.  Gastrointestinal: Negative for anal bleeding and blood in stool.  Genitourinary: Negative for difficulty urinating and dysuria.  Neurological: Negative for syncope and light-headedness.    Patient Active Problem List   Diagnosis Date Noted  . Dizziness 11/20/2017  . Macrocytic anemia 11/20/2017  . Gait instability 11/20/2017  . Do not resuscitate 11/12/2017  . TIA (transient ischemic attack) 11/11/2017  . B12 deficiency 09/04/2017  . CKD (chronic kidney disease) stage 3, GFR 30-59 ml/min (HCC) 09/04/2017  . Carpal tunnel syndrome 08/07/2017  . Thrombocytosis ([place]) 08/06/2017  . Hyperlipidemia 08/06/2017  . Atrial fibrillation ([place]) 08/06/2017    Prior to Admission medications   Medication Sig Start Date End Date Taking? Authorizing Provider  acetaminophen (TYLENOL) 500 MG tablet Take 500 mg by mouth every 6 (six) hours as needed.   Yes [provider]   apixaban (ELIQUIS) 5 MG TABS tablet Take 5 mg 2 (two) times daily by mouth.   Yes [provider]  aspirin EC 81 MG tablet Take 81 mg daily by mouth.   Yes [provider]  cyanocobalamin 1000 MCG tablet Take 1,000 mcg daily by mouth.   Yes [provider]  diclofenac sodium (VOLTAREN) 1 % GEL Apply topically 4 (four) times daily as needed.   Yes [provider]  hydroxyurea (HYDREA) 500 MG capsule Take 1,000 mg daily by mouth. May take with food to minimize GI side effects.    Yes [provider]  meclizine (ANTIVERT) 25 MG tablet Take 25 mg by mouth daily.    Yes [provider]  metoprolol tartrate (LOPRESSOR) 25 MG tablet Take 12.5 mg 2 (two) times daily by mouth.   Yes [provider]  Multiple Vitamin (MULTIVITAMIN) tablet Take 1 tablet daily by mouth.   Yes [provider]  pravastatin (PRAVACHOL) 20 MG tablet Take 20 mg daily by mouth.   Yes [provider]    No Known Allergies  Past Surgical History:  Procedure Laterality Date  . BACK SURGERY  2003  . FINGER DEBRIDEMENT  1963   Left hand 4 digit   . REPLACEMENT TOTAL KNEE Right 2006    Social History   Tobacco Use  . Smoking status: Never Smoker  . Smokeless tobacco: Never Used  Substance Use Topics  . Alcohol use: No    Frequency: Never  . Drug use: No    Family History  Problem  Relation Age of Onset  . Cancer Mother   . Emphysema Father   . Diabetes Sister   . Paget's disease of bone Brother   . Cancer Sister        lung cancer    Medication list has been reviewed and updated.  Physical Examination: BP 97/68   Pulse 62   Resp 16   Ht 5' 11.5" (1.816 m)   Wt 192 lb (87.1 kg)   SpO2 97%   BMI 26.41 kg/m   Physical Exam  Constitutional: He appears well-developed and well-nourished.  Cardiovascular: Normal rate, regular rhythm and normal heart sounds.  Orthostatics negative  Pulmonary/Chest: Effort normal and breath  sounds normal.  Musculoskeletal: He exhibits no edema.  Neurological: He is alert.  Skin: Skin is warm and dry.  Psychiatric: He has a normal mood and affect. His behavior is normal.  Nursing note and vitals reviewed.   Assessment and Plan:  1. Paroxysmal atrial fibrillation (HCC) Well controlled on metoprolol/Eliquis  2. CKD (chronic kidney disease) stage 3, GFR 30-59 ml/min (HCC) Stable, use Tylenol instead of extra asa prn pain - Basic Metabolic Panel (BMET)  3. Thrombocytosis ([place]) Well controlled, cont hydroxyurea  4. TIA (transient ischemic attack) W/u negative, do not think needs to f/u with neuro  5. Macrocytic anemia Worse since Dec, denies bleeding  - CBC - TSH - B12 - Folate  6. Hyperlipidemia, unspecified hyperlipidemia type Well controlled on Pravachol  7. Bilateral carpal tunnel syndrome Trial B wrist splints overnight  8. B12 deficiency On supplement  9. Dizziness Likely due to old CVA, adequate control on daily meclizine, continue despite cognitive/fall risks  10. Gait instability High fall risk, may benefit from walker - Ambulatory referral to [place]  Return in about 4 weeks (around 12/18/2017).  Satira Anis. [place] [facility]  11/20/2017

## 2017-11-20 NOTE — Patient Instructions (Signed)
Use Tylenol instead of aspirin as needed for pain. Stop fish oil.

## 2017-11-21 ENCOUNTER — Other Ambulatory Visit: Payer: Self-pay | Admitting: Family Medicine

## 2017-11-21 DIAGNOSIS — E039 Hypothyroidism, unspecified: Secondary | ICD-10-CM | POA: Insufficient documentation

## 2017-11-21 LAB — CBC
Hematocrit: 30.6 % — ABNORMAL LOW (ref 37.5–51.0)
Hemoglobin: 10.5 g/dL — ABNORMAL LOW (ref 13.0–17.7)
MCH: 43.4 pg — ABNORMAL HIGH (ref 26.6–33.0)
MCHC: 34.3 g/dL (ref 31.5–35.7)
MCV: 126 fL — ABNORMAL HIGH (ref 79–97)
PLATELETS: 186 10*3/uL (ref 150–379)
RBC: 2.42 x10E6/uL — CL (ref 4.14–5.80)
RDW: 21.2 % — ABNORMAL HIGH (ref 12.3–15.4)
WBC: 5.8 10*3/uL (ref 3.4–10.8)

## 2017-11-21 LAB — FOLATE: FOLATE: 9.5 ng/mL (ref >3.0)

## 2017-11-21 LAB — BASIC METABOLIC PANEL
BUN/Creatinine Ratio: 20 (ref 10–24)
BUN: 28 mg/dL (ref 10–36)
CHLORIDE: 106 mmol/L (ref 96–106)
CO2: 25 mmol/L (ref 20–29)
Calcium: 8.8 mg/dL (ref 8.6–10.2)
Creatinine, Ser: 1.43 mg/dL — ABNORMAL HIGH (ref 0.76–1.27)
GFR calc Af Amer: 49 mL/min/{1.73_m2} — ABNORMAL LOW (ref >59)
GFR calc non Af Amer: 43 mL/min/{1.73_m2} — ABNORMAL LOW (ref >59)
GLUCOSE: 85 mg/dL (ref 65–99)
POTASSIUM: 4.9 mmol/L (ref 3.5–5.2)
SODIUM: 145 mmol/L — AB (ref 134–144)

## 2017-11-21 LAB — VITAMIN B12: VITAMIN B 12: 643 pg/mL (ref 232–1245)

## 2017-11-21 LAB — TSH: TSH: 7.87 u[IU]/mL — AB (ref 0.450–4.500)

## 2017-11-21 MED ORDER — LEVOTHYROXINE SODIUM 25 MCG PO TABS: 25.0000 ug | ORAL_TABLET | Freq: Every day | ORAL | 2 refills | Status: DC

## 2017-11-25 DIAGNOSIS — I4891 Unspecified atrial fibrillation: Secondary | ICD-10-CM | POA: Diagnosis not present

## 2017-11-25 DIAGNOSIS — N183 Chronic kidney disease, stage 3 (moderate): Secondary | ICD-10-CM | POA: Diagnosis not present

## 2017-11-25 DIAGNOSIS — G8929 Other chronic pain: Secondary | ICD-10-CM | POA: Diagnosis not present

## 2017-11-25 DIAGNOSIS — M25562 Pain in left knee: Secondary | ICD-10-CM | POA: Diagnosis not present

## 2017-11-25 DIAGNOSIS — D539 Nutritional anemia, unspecified: Secondary | ICD-10-CM | POA: Diagnosis not present

## 2017-11-25 DIAGNOSIS — E785 Hyperlipidemia, unspecified: Secondary | ICD-10-CM | POA: Diagnosis not present

## 2017-11-25 DIAGNOSIS — Z7982 Long term (current) use of aspirin: Secondary | ICD-10-CM | POA: Diagnosis not present

## 2017-11-25 DIAGNOSIS — Z7901 Long term (current) use of anticoagulants: Secondary | ICD-10-CM | POA: Diagnosis not present

## 2017-11-25 DIAGNOSIS — Z8673 Personal history of transient ischemic attack (TIA), and cerebral infarction without residual deficits: Secondary | ICD-10-CM | POA: Diagnosis not present

## 2017-11-25 DIAGNOSIS — E039 Hypothyroidism, unspecified: Secondary | ICD-10-CM | POA: Diagnosis not present

## 2017-11-26 DIAGNOSIS — Z8673 Personal history of transient ischemic attack (TIA), and cerebral infarction without residual deficits: Secondary | ICD-10-CM | POA: Diagnosis not present

## 2017-11-26 DIAGNOSIS — D539 Nutritional anemia, unspecified: Secondary | ICD-10-CM | POA: Diagnosis not present

## 2017-11-26 DIAGNOSIS — I4891 Unspecified atrial fibrillation: Secondary | ICD-10-CM | POA: Diagnosis not present

## 2017-11-26 DIAGNOSIS — E039 Hypothyroidism, unspecified: Secondary | ICD-10-CM | POA: Diagnosis not present

## 2017-11-26 DIAGNOSIS — M25562 Pain in left knee: Secondary | ICD-10-CM | POA: Diagnosis not present

## 2017-11-26 DIAGNOSIS — N183 Chronic kidney disease, stage 3 (moderate): Secondary | ICD-10-CM | POA: Diagnosis not present

## 2017-11-26 DIAGNOSIS — Z7982 Long term (current) use of aspirin: Secondary | ICD-10-CM | POA: Diagnosis not present

## 2017-11-26 DIAGNOSIS — E785 Hyperlipidemia, unspecified: Secondary | ICD-10-CM | POA: Diagnosis not present

## 2017-11-26 DIAGNOSIS — G8929 Other chronic pain: Secondary | ICD-10-CM | POA: Diagnosis not present

## 2017-11-26 DIAGNOSIS — Z7901 Long term (current) use of anticoagulants: Secondary | ICD-10-CM | POA: Diagnosis not present

## 2017-12-02 DIAGNOSIS — E785 Hyperlipidemia, unspecified: Secondary | ICD-10-CM | POA: Diagnosis not present

## 2017-12-02 DIAGNOSIS — G8929 Other chronic pain: Secondary | ICD-10-CM | POA: Diagnosis not present

## 2017-12-02 DIAGNOSIS — D539 Nutritional anemia, unspecified: Secondary | ICD-10-CM | POA: Diagnosis not present

## 2017-12-02 DIAGNOSIS — N183 Chronic kidney disease, stage 3 (moderate): Secondary | ICD-10-CM | POA: Diagnosis not present

## 2017-12-02 DIAGNOSIS — I4891 Unspecified atrial fibrillation: Secondary | ICD-10-CM | POA: Diagnosis not present

## 2017-12-02 DIAGNOSIS — Z7982 Long term (current) use of aspirin: Secondary | ICD-10-CM | POA: Diagnosis not present

## 2017-12-02 DIAGNOSIS — Z8673 Personal history of transient ischemic attack (TIA), and cerebral infarction without residual deficits: Secondary | ICD-10-CM | POA: Diagnosis not present

## 2017-12-02 DIAGNOSIS — M25562 Pain in left knee: Secondary | ICD-10-CM | POA: Diagnosis not present

## 2017-12-02 DIAGNOSIS — Z7901 Long term (current) use of anticoagulants: Secondary | ICD-10-CM | POA: Diagnosis not present

## 2017-12-02 DIAGNOSIS — E039 Hypothyroidism, unspecified: Secondary | ICD-10-CM | POA: Diagnosis not present

## 2017-12-03 NOTE — Telephone Encounter (Signed)
Called to schedule AWV with Nurse Health Advisor. °Lee Mercado °[phone]  °Skype [email or web address]  ° °

## 2017-12-05 DIAGNOSIS — Z7982 Long term (current) use of aspirin: Secondary | ICD-10-CM | POA: Diagnosis not present

## 2017-12-05 DIAGNOSIS — E039 Hypothyroidism, unspecified: Secondary | ICD-10-CM | POA: Diagnosis not present

## 2017-12-05 DIAGNOSIS — I4891 Unspecified atrial fibrillation: Secondary | ICD-10-CM | POA: Diagnosis not present

## 2017-12-05 DIAGNOSIS — N183 Chronic kidney disease, stage 3 (moderate): Secondary | ICD-10-CM | POA: Diagnosis not present

## 2017-12-05 DIAGNOSIS — M25562 Pain in left knee: Secondary | ICD-10-CM | POA: Diagnosis not present

## 2017-12-05 DIAGNOSIS — Z8673 Personal history of transient ischemic attack (TIA), and cerebral infarction without residual deficits: Secondary | ICD-10-CM | POA: Diagnosis not present

## 2017-12-05 DIAGNOSIS — Z7901 Long term (current) use of anticoagulants: Secondary | ICD-10-CM | POA: Diagnosis not present

## 2017-12-05 DIAGNOSIS — D539 Nutritional anemia, unspecified: Secondary | ICD-10-CM | POA: Diagnosis not present

## 2017-12-05 DIAGNOSIS — G8929 Other chronic pain: Secondary | ICD-10-CM | POA: Diagnosis not present

## 2017-12-05 DIAGNOSIS — E785 Hyperlipidemia, unspecified: Secondary | ICD-10-CM | POA: Diagnosis not present

## 2017-12-08 ENCOUNTER — Ambulatory Visit: Payer: Medicare Other | Admitting: Family Medicine

## 2017-12-08 DIAGNOSIS — E039 Hypothyroidism, unspecified: Secondary | ICD-10-CM | POA: Diagnosis not present

## 2017-12-08 DIAGNOSIS — M25562 Pain in left knee: Secondary | ICD-10-CM | POA: Diagnosis not present

## 2017-12-08 DIAGNOSIS — Z8673 Personal history of transient ischemic attack (TIA), and cerebral infarction without residual deficits: Secondary | ICD-10-CM | POA: Diagnosis not present

## 2017-12-08 DIAGNOSIS — Z7982 Long term (current) use of aspirin: Secondary | ICD-10-CM | POA: Diagnosis not present

## 2017-12-08 DIAGNOSIS — G8929 Other chronic pain: Secondary | ICD-10-CM | POA: Diagnosis not present

## 2017-12-08 DIAGNOSIS — I4891 Unspecified atrial fibrillation: Secondary | ICD-10-CM | POA: Diagnosis not present

## 2017-12-08 DIAGNOSIS — E785 Hyperlipidemia, unspecified: Secondary | ICD-10-CM | POA: Diagnosis not present

## 2017-12-08 DIAGNOSIS — Z7901 Long term (current) use of anticoagulants: Secondary | ICD-10-CM | POA: Diagnosis not present

## 2017-12-08 DIAGNOSIS — D539 Nutritional anemia, unspecified: Secondary | ICD-10-CM | POA: Diagnosis not present

## 2017-12-08 DIAGNOSIS — N183 Chronic kidney disease, stage 3 (moderate): Secondary | ICD-10-CM | POA: Diagnosis not present

## 2017-12-11 DIAGNOSIS — E785 Hyperlipidemia, unspecified: Secondary | ICD-10-CM | POA: Diagnosis not present

## 2017-12-11 DIAGNOSIS — I4891 Unspecified atrial fibrillation: Secondary | ICD-10-CM | POA: Diagnosis not present

## 2017-12-11 DIAGNOSIS — Z8673 Personal history of transient ischemic attack (TIA), and cerebral infarction without residual deficits: Secondary | ICD-10-CM | POA: Diagnosis not present

## 2017-12-11 DIAGNOSIS — N183 Chronic kidney disease, stage 3 (moderate): Secondary | ICD-10-CM | POA: Diagnosis not present

## 2017-12-11 DIAGNOSIS — E039 Hypothyroidism, unspecified: Secondary | ICD-10-CM | POA: Diagnosis not present

## 2017-12-11 DIAGNOSIS — Z7901 Long term (current) use of anticoagulants: Secondary | ICD-10-CM | POA: Diagnosis not present

## 2017-12-11 DIAGNOSIS — D539 Nutritional anemia, unspecified: Secondary | ICD-10-CM | POA: Diagnosis not present

## 2017-12-11 DIAGNOSIS — Z7982 Long term (current) use of aspirin: Secondary | ICD-10-CM | POA: Diagnosis not present

## 2017-12-11 DIAGNOSIS — G8929 Other chronic pain: Secondary | ICD-10-CM | POA: Diagnosis not present

## 2017-12-11 DIAGNOSIS — M25562 Pain in left knee: Secondary | ICD-10-CM | POA: Diagnosis not present

## 2017-12-16 DIAGNOSIS — I4891 Unspecified atrial fibrillation: Secondary | ICD-10-CM | POA: Diagnosis not present

## 2017-12-16 DIAGNOSIS — N183 Chronic kidney disease, stage 3 (moderate): Secondary | ICD-10-CM | POA: Diagnosis not present

## 2017-12-16 DIAGNOSIS — G8929 Other chronic pain: Secondary | ICD-10-CM | POA: Diagnosis not present

## 2017-12-16 DIAGNOSIS — E785 Hyperlipidemia, unspecified: Secondary | ICD-10-CM | POA: Diagnosis not present

## 2017-12-16 DIAGNOSIS — Z7901 Long term (current) use of anticoagulants: Secondary | ICD-10-CM | POA: Diagnosis not present

## 2017-12-16 DIAGNOSIS — E039 Hypothyroidism, unspecified: Secondary | ICD-10-CM | POA: Diagnosis not present

## 2017-12-16 DIAGNOSIS — D539 Nutritional anemia, unspecified: Secondary | ICD-10-CM | POA: Diagnosis not present

## 2017-12-16 DIAGNOSIS — M25562 Pain in left knee: Secondary | ICD-10-CM | POA: Diagnosis not present

## 2017-12-16 DIAGNOSIS — Z7982 Long term (current) use of aspirin: Secondary | ICD-10-CM | POA: Diagnosis not present

## 2017-12-16 DIAGNOSIS — Z8673 Personal history of transient ischemic attack (TIA), and cerebral infarction without residual deficits: Secondary | ICD-10-CM | POA: Diagnosis not present

## 2017-12-17 ENCOUNTER — Ambulatory Visit (INDEPENDENT_AMBULATORY_CARE_PROVIDER_SITE_OTHER): Payer: Medicare HMO | Admitting: Family Medicine

## 2017-12-17 ENCOUNTER — Encounter: Payer: Self-pay | Admitting: Family Medicine

## 2017-12-17 VITALS — BP 110/62 | HR 78 | Ht 71.0 in | Wt 195.0 lb

## 2017-12-17 DIAGNOSIS — D539 Nutritional anemia, unspecified: Secondary | ICD-10-CM | POA: Diagnosis not present

## 2017-12-17 DIAGNOSIS — G459 Transient cerebral ischemic attack, unspecified: Secondary | ICD-10-CM | POA: Diagnosis not present

## 2017-12-17 DIAGNOSIS — D473 Essential (hemorrhagic) thrombocythemia: Secondary | ICD-10-CM | POA: Diagnosis not present

## 2017-12-17 DIAGNOSIS — N183 Chronic kidney disease, stage 3 unspecified: Secondary | ICD-10-CM

## 2017-12-17 DIAGNOSIS — G5603 Carpal tunnel syndrome, bilateral upper limbs: Secondary | ICD-10-CM | POA: Diagnosis not present

## 2017-12-17 DIAGNOSIS — R42 Dizziness and giddiness: Secondary | ICD-10-CM | POA: Diagnosis not present

## 2017-12-17 DIAGNOSIS — I480 Paroxysmal atrial fibrillation: Secondary | ICD-10-CM | POA: Diagnosis not present

## 2017-12-17 DIAGNOSIS — R2681 Unsteadiness on feet: Secondary | ICD-10-CM | POA: Diagnosis not present

## 2017-12-17 DIAGNOSIS — E538 Deficiency of other specified B group vitamins: Secondary | ICD-10-CM

## 2017-12-17 DIAGNOSIS — E039 Hypothyroidism, unspecified: Secondary | ICD-10-CM | POA: Diagnosis not present

## 2017-12-17 DIAGNOSIS — D75839 Thrombocytosis, unspecified: Secondary | ICD-10-CM

## 2017-12-17 NOTE — Progress Notes (Signed)
Date:  12/17/2017   Name:  Lee Mercado   DOB:  1924/11/22   MRN:  [id number]  PCP:  Adline Potter, MD    Chief Complaint: Follow-up (haven't started thyroid med yet/ didn't realize he had a Rx for it)   History of Present Illness:  This is a 84 y.o. male seen for three month f/u. Decreased hydroxyurea but never started Synthroid. Cardiac sxs stable, occ BLE edema. B CTS still bothers off and on, has not tried OTC wrist splints at night. Saw HHPT, feels walking better with cane, no recent falls.  Review of Systems:  Review of Systems  Constitutional: Negative for chills and fever.  Respiratory: Negative for cough and shortness of breath.   Cardiovascular: Negative for chest pain.  Genitourinary: Negative for difficulty urinating.  Neurological: Negative for syncope and light-headedness.    Patient Active Problem List   Diagnosis Date Noted  . Hypothyroidism 11/21/2017  . Dizziness 11/20/2017  . Macrocytic anemia 11/20/2017  . Gait instability 11/20/2017  . Prediabetes 11/20/2017  . Do not resuscitate 11/12/2017  . TIA (transient ischemic attack) 11/11/2017  . B12 deficiency 09/04/2017  . CKD (chronic kidney disease) stage 3, GFR 30-59 ml/min (HCC) 09/04/2017  . Carpal tunnel syndrome 08/07/2017  . Thrombocytosis ([place]) 08/06/2017  . Hyperlipidemia 08/06/2017  . Atrial fibrillation ([place]) 08/06/2017    Prior to Admission medications   Medication Sig Start Date End Date Taking? Authorizing Provider  acetaminophen (TYLENOL) 500 MG tablet Take 500 mg by mouth every 6 (six) hours as needed.   Yes [provider]  apixaban (ELIQUIS) 5 MG TABS tablet Take 5 mg 2 (two) times daily by mouth.   Yes [provider]  aspirin EC 81 MG tablet Take 81 mg daily by mouth.   Yes [provider]  cyanocobalamin 1000 MCG tablet Take 1,000 mcg daily by mouth.   Yes [provider]  diclofenac sodium (VOLTAREN) 1 % GEL Apply topically 4 (four) times daily as  needed.   Yes [provider]  hydroxyurea (HYDREA) 500 MG capsule Take 500 mg by mouth daily. May take with food to minimize GI side effects.    Yes [provider]  meclizine (ANTIVERT) 25 MG tablet Take 25 mg by mouth daily.    Yes [provider]  metoprolol tartrate (LOPRESSOR) 25 MG tablet Take 12.5 mg 2 (two) times daily by mouth.   Yes [provider]  Multiple Vitamin (MULTIVITAMIN) tablet Take 1 tablet daily by mouth.   Yes [provider]  pravastatin (PRAVACHOL) 20 MG tablet Take 20 mg daily by mouth.   Yes [provider]    No Known Allergies  Past Surgical History:  Procedure Laterality Date  . BACK SURGERY  2003  . FINGER DEBRIDEMENT  1963   Left hand 4 digit   . REPLACEMENT TOTAL KNEE Right 2006    Social History   Tobacco Use  . Smoking status: Never Smoker  . Smokeless tobacco: Never Used  Substance Use Topics  . Alcohol use: No    Frequency: Never  . Drug use: No    Family History  Problem Relation Age of Onset  . Cancer Mother   . Emphysema Father   . Diabetes Sister   . Paget's disease of bone Brother   . Cancer Sister        lung cancer    Medication list has been reviewed and updated.  Physical Examination: BP 110/62   Pulse 78  Ht 5\' 11"  (1.803 m)   Wt 195 lb (88.5 kg)   BMI 27.20 kg/m   Physical Exam  Constitutional: He appears well-developed and well-nourished.  Cardiovascular: Normal rate, regular rhythm and normal heart sounds.  Pulmonary/Chest: Effort normal and breath sounds normal.  Musculoskeletal:  1+ BLE edema  Neurological: He is alert.  Skin: Skin is warm and dry.  Psychiatric: He has a normal mood and affect. His behavior is normal.  Nursing note and vitals reviewed.   Assessment and Plan:  1. Paroxysmal atrial fibrillation (HCC) Stable on metoprolol/Eliquis  2. CKD (chronic kidney disease) stage 3, GFR 30-59 ml/min (HCC) Stable last visit, avoiding NSAIDS  (consider d/c asa on Eliquis) - Basic Metabolic Panel (BMET) - Vitamin D (25 hydroxy)  3. Thrombocytosis ([place]) On decreased hydroxyurea  4. Macrocytic anemia - CBC  5. TIA (transient ischemic attack) Stable on asa/Pravachol  6. Hypothyroidism, unspecified type Hold Synthroid as not yet started, recheck TSH today, may monitor if remains < 10 - TSH  7. Bilateral carpal tunnel syndrome Intermittent, consider OTC wrist splints if worsens  8. Gait instability Improved s/p HHPT  9. B12 deficiency Well controlled on supplement  10. Dizziness Well controlled on daily meclizine, intolerant taper  Return in about 6 months (around 06/19/2018).  Satira Anis. [facility]  12/17/2017

## 2017-12-18 ENCOUNTER — Other Ambulatory Visit: Payer: Self-pay | Admitting: Family Medicine

## 2017-12-18 LAB — TSH: TSH: 3.62 u[IU]/mL (ref 0.450–4.500)

## 2017-12-18 LAB — BASIC METABOLIC PANEL
BUN / CREAT RATIO: 20 (ref 10–24)
BUN: 23 mg/dL (ref 10–36)
CO2: 23 mmol/L (ref 20–29)
Calcium: 9.1 mg/dL (ref 8.6–10.2)
Chloride: 104 mmol/L (ref 96–106)
Creatinine, Ser: 1.16 mg/dL (ref 0.76–1.27)
GFR, EST AFRICAN AMERICAN: 64 mL/min/{1.73_m2} (ref >59)
GFR, EST NON AFRICAN AMERICAN: 55 mL/min/{1.73_m2} — AB (ref >59)
Glucose: 96 mg/dL (ref 65–99)
POTASSIUM: 4.6 mmol/L (ref 3.5–5.2)
SODIUM: 141 mmol/L (ref 134–144)

## 2017-12-18 LAB — CBC
Hematocrit: 30.5 % — ABNORMAL LOW (ref 37.5–51.0)
Hemoglobin: 10.5 g/dL — ABNORMAL LOW (ref 13.0–17.7)
MCH: 45.1 pg — AB (ref 26.6–33.0)
MCHC: 34.4 g/dL (ref 31.5–35.7)
MCV: 131 fL — ABNORMAL HIGH (ref 79–97)
PLATELETS: 271 10*3/uL (ref 150–379)
RBC: 2.33 x10E6/uL — CL (ref 4.14–5.80)
RDW: 17.4 % — ABNORMAL HIGH (ref 12.3–15.4)
WBC: 5.6 10*3/uL (ref 3.4–10.8)

## 2017-12-18 LAB — VITAMIN D 25 HYDROXY (VIT D DEFICIENCY, FRACTURES): Vit D, 25-Hydroxy: 15.9 ng/mL — ABNORMAL LOW (ref 30.0–100.0)

## 2017-12-18 MED ORDER — VITAMIN D 50 MCG (2000 UT) PO CAPS: 1.0000 | ORAL_CAPSULE | Freq: Every day | ORAL | Status: DC

## 2017-12-18 MED ORDER — LEVOTHYROXINE SODIUM 25 MCG PO TABS: 25.0000 ug | ORAL_TABLET | Freq: Every day | ORAL | Status: DC

## 2018-01-29 ENCOUNTER — Other Ambulatory Visit: Payer: Self-pay

## 2018-03-03 ENCOUNTER — Other Ambulatory Visit: Payer: Self-pay | Admitting: Family Medicine

## 2018-03-03 ENCOUNTER — Other Ambulatory Visit: Payer: Self-pay

## 2018-03-03 MED ORDER — LEVOTHYROXINE SODIUM 25 MCG PO TABS: 25.0000 ug | ORAL_TABLET | Freq: Every day | ORAL | 0 refills | Status: DC

## 2018-03-03 NOTE — Telephone Encounter (Signed)
Sent manual RX refill 03/03/2018 [facility]

## 2018-03-23 ENCOUNTER — Ambulatory Visit (INDEPENDENT_AMBULATORY_CARE_PROVIDER_SITE_OTHER): Payer: Medicare HMO

## 2018-03-23 VITALS — BP 110/62 | HR 57 | Temp 97.6°F | Resp 12 | Ht 71.0 in | Wt 198.4 lb

## 2018-03-23 DIAGNOSIS — Z0000 Encounter for general adult medical examination without abnormal findings: Secondary | ICD-10-CM | POA: Diagnosis not present

## 2018-03-23 NOTE — Patient Instructions (Addendum)
Lee Mercado , Thank you for taking time to come for your Medicare Wellness Visit. I appreciate your ongoing commitment to your health goals. Please review the following plan we discussed and let me know if I can assist you in the future.   Screening recommendations/referrals: Colorectal Screening: No longer required  Vision and Dental Exams: Recommended annual ophthalmology exams for early detection of glaucoma and other disorders of the eye Recommended annual dental exams for proper oral hygiene  Vaccinations: Influenza vaccine: Please provide vaccination record Pneumococcal vaccine: Up to date Tdap vaccine: Up to date Shingles vaccine: Please call your insurance company to determine your out of pocket expense for the Shingrix vaccine. You may also receive this vaccine at your local pharmacy or Health Dept.   Advanced directives: Advance directive discussed with you today. I have provided a copy for you to complete at home and have notarized. Once this is complete please bring a copy in to our office so we can scan it into your chart.  Goals: Recommend to drink at least 6-8 8oz glasses of water per day.  Next appointment: Please schedule your Annual Wellness Visit with your Nurse Health Advisor in one year.  Preventive Care 41 Years and Older, Male Preventive care refers to lifestyle choices and visits with your health care provider that can promote health and wellness. What does preventive care include?  A yearly physical exam. This is also called an annual well check.  Dental exams once or twice a year.  Routine eye exams. Ask your health care provider how often you should have your eyes checked.  Personal lifestyle choices, including:  Daily care of your teeth and gums.  Regular physical activity.  Eating a healthy diet.  Avoiding tobacco and drug use.  Limiting alcohol use.  Practicing safe sex.  Taking low doses of aspirin every day.  Taking vitamin and mineral  supplements as recommended by your health care provider. What happens during an annual well check? The services and screenings done by your health care provider during your annual well check will depend on your age, overall health, lifestyle risk factors, and family history of disease. Counseling  Your health care provider may ask you questions about your:  Alcohol use.  Tobacco use.  Drug use.  Emotional well-being.  Home and relationship well-being.  Sexual activity.  Eating habits.  History of falls.  Memory and ability to understand (cognition).  Work and work Statistician. Screening  You may have the following tests or measurements:  Height, weight, and BMI.  Blood pressure.  Lipid and cholesterol levels. These may be checked every 5 years, or more frequently if you are over 87 years old.  Skin check.  Lung cancer screening. You may have this screening every year starting at age 77 if you have a 30-pack-year history of smoking and currently smoke or have quit within the past 15 years.  Fecal occult blood test (FOBT) of the stool. You may have this test every year starting at age 39.  Flexible sigmoidoscopy or colonoscopy. You may have a sigmoidoscopy every 5 years or a colonoscopy every 10 years starting at age 31.  Prostate cancer screening. Recommendations will vary depending on your family history and other risks.  Hepatitis C blood test.  Hepatitis B blood test.  Sexually transmitted disease (STD) testing.  Diabetes screening. This is done by checking your blood sugar (glucose) after you have not eaten for a while (fasting). You may have this done every 1-3 years.  Abdominal aortic aneurysm (AAA) screening. You may need this if you are a current or former smoker.  Osteoporosis. You may be screened starting at age 74 if you are at high risk. Talk with your health care provider about your test results, treatment options, and if necessary, the need for more  tests. Vaccines  Your health care provider may recommend certain vaccines, such as:  Influenza vaccine. This is recommended every year.  Tetanus, diphtheria, and acellular pertussis (Tdap, Td) vaccine. You may need a Td booster every 10 years.  Zoster vaccine. You may need this after age 29.  Pneumococcal 13-valent conjugate (PCV13) vaccine. One dose is recommended after age 8.  Pneumococcal polysaccharide (PPSV23) vaccine. One dose is recommended after age 37. Talk to your health care provider about which screenings and vaccines you need and how often you need them. This information is not intended to replace advice given to you by your health care provider. Make sure you discuss any questions you have with your health care provider. Document Released: 10/13/2015 Document Revised: 06/05/2016 Document Reviewed: 07/18/2015 Elsevier Interactive Patient Education  2017 [employer] Prevention in the Home Falls can cause injuries. They can happen to people of all ages. There are many things you can do to make your home safe and to help prevent falls. What can I do on the outside of my home?  Regularly fix the edges of walkways and driveways and fix any cracks.  Remove anything that might make you trip as you walk through a door, such as a raised step or threshold.  Trim any bushes or trees on the path to your home.  Use bright outdoor lighting.  Clear any walking paths of anything that might make someone trip, such as rocks or tools.  Regularly check to see if handrails are loose or broken. Make sure that both sides of any steps have handrails.  Any raised decks and porches should have guardrails on the edges.  Have any leaves, snow, or ice cleared regularly.  Use sand or salt on walking paths during winter.  Clean up any spills in your garage right away. This includes oil or grease spills. What can I do in the bathroom?  Use night lights.  Install grab bars by the  toilet and in the tub and shower. Do not use towel bars as grab bars.  Use non-skid mats or decals in the tub or shower.  If you need to sit down in the shower, use a plastic, non-slip stool.  Keep the floor dry. Clean up any water that spills on the floor as soon as it happens.  Remove soap buildup in the tub or shower regularly.  Attach bath mats securely with double-sided non-slip rug tape.  Do not have throw rugs and other things on the floor that can make you trip. What can I do in the bedroom?  Use night lights.  Make sure that you have a light by your bed that is easy to reach.  Do not use any sheets or blankets that are too big for your bed. They should not hang down onto the floor.  Have a firm chair that has side arms. You can use this for support while you get dressed.  Do not have throw rugs and other things on the floor that can make you trip. What can I do in the kitchen?  Clean up any spills right away.  Avoid walking on wet floors.  Keep items that you use  a lot in easy-to-reach places.  If you need to reach something above you, use a strong step stool that has a grab bar.  Keep electrical cords out of the way.  Do not use floor polish or wax that makes floors slippery. If you must use wax, use non-skid floor wax.  Do not have throw rugs and other things on the floor that can make you trip. What can I do with my stairs?  Do not leave any items on the stairs.  Make sure that there are handrails on both sides of the stairs and use them. Fix handrails that are broken or loose. Make sure that handrails are as long as the stairways.  Check any carpeting to make sure that it is firmly attached to the stairs. Fix any carpet that is loose or worn.  Avoid having throw rugs at the top or bottom of the stairs. If you do have throw rugs, attach them to the floor with carpet tape.  Make sure that you have a light switch at the top of the stairs and the bottom of  the stairs. If you do not have them, ask someone to add them for you. What else can I do to help prevent falls?  Wear shoes that:  Do not have high heels.  Have rubber bottoms.  Are comfortable and fit you well.  Are closed at the toe. Do not wear sandals.  If you use a stepladder:  Make sure that it is fully opened. Do not climb a closed stepladder.  Make sure that both sides of the stepladder are locked into place.  Ask someone to hold it for you, if possible.  Clearly mark and make sure that you can see:  Any grab bars or handrails.  First and last steps.  Where the edge of each step is.  Use tools that help you move around (mobility aids) if they are needed. These include:  Canes.  Walkers.  Scooters.  Crutches.  Turn on the lights when you go into a dark area. Replace any light bulbs as soon as they burn out.  Set up your furniture so you have a clear path. Avoid moving your furniture around.  If any of your floors are uneven, fix them.  If there are any pets around you, be aware of where they are.  Review your medicines with your doctor. Some medicines can make you feel dizzy. This can increase your chance of falling. Ask your doctor what other things that you can do to help prevent falls. This information is not intended to replace advice given to you by your health care provider. Make sure you discuss any questions you have with your health care provider. Document Released: 07/13/2009 Document Revised: 02/22/2016 Document Reviewed: 10/21/2014 Elsevier Interactive Patient Education  2017 [employer].

## 2018-03-23 NOTE — Progress Notes (Signed)
Subjective:   Lee Mercado is a 84 y.o. male who presents for an Initial Medicare Annual Wellness Visit.  Review of Systems  N/A Cardiac Risk Factors include: advanced age (>45men, >59 women);dyslipidemia;male gender;sedentary lifestyle    Objective:    Today's Vitals   03/23/18 1456  BP: 110/62  Pulse: (!) 57  Resp: 12  Temp: 97.6 F (36.4 C)  TempSrc: Oral  SpO2: 92%  Weight: 198 lb 6.4 oz (90 kg)  Height: 5\' 11"  (1.803 m)   Body mass index is 27.67 kg/m.  Advanced Directives 03/23/2018 11/11/2017 11/11/2017  Does Patient Have a Medical Advance Directive? No No No  Would patient like information on creating a medical advance directive? Yes (MAU/Ambulatory/Procedural Areas - Information given) No - Patient declined -    Current Medications (verified) Outpatient Encounter Medications as of 03/23/2018  Medication Sig  . acetaminophen (TYLENOL) 500 MG tablet Take 500 mg by mouth every 6 (six) hours as needed.  Marland Kitchen apixaban (ELIQUIS) 5 MG TABS tablet Take 5 mg 2 (two) times daily by mouth.  Marland Kitchen aspirin EC 81 MG tablet Take 81 mg daily by mouth.  . Cholecalciferol (VITAMIN D) 2000 units CAPS Take 1 capsule (2,000 Units total) by mouth daily.  . cyanocobalamin 1000 MCG tablet Take 1,000 mcg daily by mouth.  . hydroxyurea (HYDREA) 500 MG capsule Take 500 mg by mouth daily. May take with food to minimize GI side effects.   Marland Kitchen levothyroxine (SYNTHROID, LEVOTHROID) 25 MCG tablet Take 1 tablet (25 mcg total) by mouth daily before breakfast.  . meclizine (ANTIVERT) 25 MG tablet Take 25 mg by mouth daily.   . metoprolol tartrate (LOPRESSOR) 25 MG tablet Take 12.5 mg 2 (two) times daily by mouth.  . pravastatin (PRAVACHOL) 20 MG tablet Take 20 mg daily by mouth.  . diclofenac sodium (VOLTAREN) 1 % GEL Apply topically 4 (four) times daily as needed.  . Multiple Vitamin (MULTIVITAMIN) tablet Take 1 tablet daily by mouth.   No facility-administered encounter medications on file as of  03/23/2018.     Allergies (verified) Patient has no known allergies.   History: Past Medical History:  Diagnosis Date  . A-fib ([place])   . Cancer ([place])    skin cancers  . Coagulation defect ([place])   . Finger injury   . History of heat stroke   . Hyperlipidemia   . Hypertension   . Previous back surgery   . Stroke ([place])   . Vertigo    Past Surgical History:  Procedure Laterality Date  . BACK SURGERY  2003  . FINGER DEBRIDEMENT  1963   Left hand 4 digit   . REPLACEMENT TOTAL KNEE Right 2006   Family History  Problem Relation Age of Onset  . Cancer Mother        breast  . Emphysema Father   . Diabetes Sister   . Paget's disease of bone Brother   . Cancer Sister        lung cancer  . Healthy Sister    Social History   Socioeconomic History  . Marital status: Married    Spouse name: Whitney Bingaman  . Number of children: 3  . Years of education: Not on file  . Highest education level: 7th grade  Occupational History  . Occupation: Retired  Scientific laboratory technician  . Financial resource strain: Not hard at all  . Food insecurity:    Worry: Never true    Inability: Never true  . Transportation needs:  Medical: No    Non-medical: No  Tobacco Use  . Smoking status: Never Smoker  . Smokeless tobacco: Never Used  . Tobacco comment: smoking cessation materials not required  Substance and Sexual Activity  . Alcohol use: No    Frequency: Never  . Drug use: No  . Sexual activity: Never  Lifestyle  . Physical activity:    Days per week: 7 days    Minutes per session: 30 min  . Stress: To some extent  Relationships  . Social connections:    Talks on phone: Once a week    Gets together: Once a week    Attends religious service: Never    Active member of club or organization: No    Attends meetings of clubs or organizations: Never    Relationship status: Married  Other Topics Concern  . Not on file  Social History Narrative   Moved here to be close to children. Lived  here in [place] many years ago but moved to [facility] and lived there 12 yrs. Has children that visit and call regularly.    Tobacco Counseling Counseling given: No Comment: smoking cessation materials not required  Clinical Intake:  Pre-visit preparation completed: Yes  Pain : No/denies pain   BMI - recorded: 27.2 Nutritional Status: BMI 25 -29 Overweight Nutritional Risks: None Diabetes: No  How often do you need to have someone help you when you read instructions, pamphlets, or other written materials from your doctor or pharmacy?: 1 - Never  Interpreter Needed?: No  Information entered by :: AEversole, LPN  Activities of Daily Living In your present state of health, do you have any difficulty performing the following activities: 03/23/2018 11/11/2017  Hearing? N N  Comment denies hearing aids, vertigo -  Vision? N N  Comment denies wearing eyeglasses -  Difficulty concentrating or making decisions? Y N  Comment short term memory loss -  Walking or climbing stairs? Y Y  Comment back pain -  Dressing or bathing? N N  Doing errands, shopping? Tempie Donning  Comment son transports -  Conservation officer, nature and eating ? N -  Comment denies dentures -  Using the Toilet? N -  In the past six months, have you accidently leaked urine? N -  Do you have problems with loss of bowel control? N -  Managing your Medications? Y -  Comment son manages medications -  Managing your Finances? N -  Housekeeping or managing your Housekeeping? N -     Immunizations and Health Maintenance Immunization History  Administered Date(s) Administered  . Pneumococcal Conjugate-13 08/06/2017  . Tdap 08/06/2017   There are no preventive care reminders to display for this patient.  Patient Care Team: Juline Patch, MD as PCP - General (Family Medicine)  Indicate any recent Medical Services you may have received from other than [facility] providers in the past year (date may be approximate).    Assessment:   This is a  routine wellness examination for [place].  Hearing/Vision screen Vision Screening Comments: Not established with a provider for annual eye exams  Dietary issues and exercise activities discussed: Current Exercise Habits: Home exercise routine, Type of exercise: strength training/weights, Time (Minutes): 30, Frequency (Times/Week): 7, Weekly Exercise (Minutes/Week): 210, Intensity: Mild, Exercise limited by: None identified  Goals    . DIET - INCREASE WATER INTAKE     Recommend to drink at least 6-8 8oz glasses of water per day.      Depression Screen [facility] 2/9  Scores 03/23/2018 09/03/2017 08/06/2017  PHQ - 2 Score 2 0 0  PHQ- 9 Score 8 - -    Fall Risk Fall Risk  03/23/2018 09/03/2017 08/06/2017  Falls in the past year? No No No  Risk for fall due to : Other (Comment);Mental status change;Impaired balance/gait - -  Risk for fall due to: Comment vertigo; depression; ambulates with cane - -    FALL RISK PREVENTION PERTAINING TO HOME: Is your home free of loose throw rugs in walkways, pet beds, electrical cords, etc? Yes Is there adequate lighting in your home to reduce risk of falls?  Yes Are there stairs in or around your home WITH handrails? No stairs  ASSISTIVE DEVICES UTILIZED TO PREVENT FALLS: Use of a cane, walker or w/c? Yes, cane Grab bars in the bathroom? Yes  Shower chair or a place to sit while bathing? Yes An elevated toilet seat or a handicapped toilet? Yes  Timed Get Up and Go Performed: Yes. Pt ambulated 10 feet within 23 sec. Gait slow, steady and with the use of an assistive device. No intervention required at this time. Fall risk prevention has been discussed.  Community Resource Referral:  [employer] Referral not required at this time.   Cognitive Function:     6CIT Screen 03/23/2018  What Year? 0 points  What month? 3 points  What time? 0 points  Count back from 20 0 points  Months in reverse 4 points  Repeat phrase 6 points  Total Score 13     Screening Tests Health Maintenance  Topic Date Due  . INFLUENZA VACCINE  06/30/2018 (Originally 04/30/2018)  . PNA vac Low Risk Adult (2 of 2 - PPSV23) 08/06/2018  . TETANUS/TDAP  08/07/2027    Qualifies for Shingles Vaccine? Yes. Due for Shingrix. Education has been provided regarding the importance of this vaccine. Pt has been advised to call his insurance company to determine his out of pocket expense. Advised he may also receive this vaccine at his local pharmacy or Health Dept. Verbalized acceptance and understanding.  Cancer Screenings: Lung: Low Dose CT Chest recommended if Age 39-80 years, 30 pack-year currently smoking OR have quit w/in 15years. Patient does not qualify. Colorectal: No longer required  Additional Screenings: Hepatitis C Screening: Does not qualify    Plan:  I have personally reviewed and addressed the Medicare Annual Wellness questionnaire and have noted the following in the patient's chart:  A. Medical and social history B. Use of alcohol, tobacco or illicit drugs  C. Current medications and supplements D. Functional ability and status E.  Nutritional status F.  Physical activity G. Advance directives H. List of other physicians I.  Hospitalizations, surgeries, and ER visits in previous 12 months J.  [place] such as hearing and vision if needed, cognitive and depression L. Referrals and appointments  In addition, I have reviewed and discussed with patient certain preventive protocols, quality metrics, and best practice recommendations. A written personalized care plan for preventive services as well as general preventive health recommendations were provided to patient.  Signed,  Aleatha Borer, LPN Nurse Health Advisor  MD Recommendations: Due for Shingrix. Education has been provided regarding the importance of this vaccine. Pt has been advised to call his insurance company to determine his out of pocket expense. Advised he may also  receive this vaccine at his local pharmacy or Health Dept. Verbalized acceptance and understanding.  6CIT score = 13  PHQ2 score = 2 and PHQ9 score = 8.  Recently moved from [facility] to [facility] which has led to increased depression.

## 2018-03-24 ENCOUNTER — Encounter: Payer: Self-pay | Admitting: Family Medicine

## 2018-03-24 ENCOUNTER — Ambulatory Visit (INDEPENDENT_AMBULATORY_CARE_PROVIDER_SITE_OTHER): Payer: Medicare HMO | Admitting: Family Medicine

## 2018-03-24 VITALS — BP 106/72 | HR 54 | Resp 16 | Ht 71.0 in | Wt 199.0 lb

## 2018-03-24 DIAGNOSIS — I480 Paroxysmal atrial fibrillation: Secondary | ICD-10-CM

## 2018-03-24 DIAGNOSIS — E039 Hypothyroidism, unspecified: Secondary | ICD-10-CM

## 2018-03-24 DIAGNOSIS — D473 Essential (hemorrhagic) thrombocythemia: Secondary | ICD-10-CM | POA: Diagnosis not present

## 2018-03-24 DIAGNOSIS — E785 Hyperlipidemia, unspecified: Secondary | ICD-10-CM | POA: Diagnosis not present

## 2018-03-24 DIAGNOSIS — N183 Chronic kidney disease, stage 3 unspecified: Secondary | ICD-10-CM

## 2018-03-24 DIAGNOSIS — R7303 Prediabetes: Secondary | ICD-10-CM

## 2018-03-24 DIAGNOSIS — D75839 Thrombocytosis, unspecified: Secondary | ICD-10-CM

## 2018-03-24 DIAGNOSIS — E538 Deficiency of other specified B group vitamins: Secondary | ICD-10-CM | POA: Diagnosis not present

## 2018-03-24 MED ORDER — APIXABAN 5 MG PO TABS: 5.0000 mg | ORAL_TABLET | Freq: Two times a day (BID) | ORAL | 5 refills | Status: DC

## 2018-03-24 MED ORDER — HYDROXYUREA 500 MG PO CAPS: 500.0000 mg | ORAL_CAPSULE | Freq: Every day | ORAL | 1 refills | Status: DC

## 2018-03-24 MED ORDER — PRAVASTATIN SODIUM 20 MG PO TABS: 20.0000 mg | ORAL_TABLET | Freq: Every day | ORAL | 1 refills | Status: DC

## 2018-03-24 MED ORDER — LEVOTHYROXINE SODIUM 25 MCG PO TABS
25.0000 ug | ORAL_TABLET | Freq: Every day | ORAL | 1 refills | Status: DC
Start: 2018-03-24 — End: 2018-09-28

## 2018-03-24 MED ORDER — CYANOCOBALAMIN 1000 MCG PO TABS: 1000.0000 ug | ORAL_TABLET | Freq: Every day | ORAL | 3 refills | Status: DC

## 2018-03-24 MED ORDER — METOPROLOL TARTRATE 25 MG PO TABS: 12.5000 mg | ORAL_TABLET | Freq: Two times a day (BID) | ORAL | 1 refills | Status: DC

## 2018-03-24 NOTE — Assessment & Plan Note (Signed)
Will check status with renal panel

## 2018-03-24 NOTE — Assessment & Plan Note (Deleted)
Patient stable on Eliquis 5 mg. Will continue

## 2018-03-24 NOTE — Assessment & Plan Note (Signed)
Will check A1C.  

## 2018-03-24 NOTE — Progress Notes (Signed)
Name: Lee Mercado   MRN: [id number]    DOB: June 17, 1925   Date:03/24/2018       Progress Note  Subjective  Chief Complaint  Chief Complaint  Patient presents with  . Hypothyroidism    Patient presents for refill of medication. Patient has almost recovered from fall on 6 weeks ago.   Thyroid Problem  Presents for follow-up visit. Symptoms include cold intolerance, constipation, fatigue, heat intolerance and weight loss. Patient reports no anxiety, depressed mood, diaphoresis, diarrhea, dry skin, hair loss, hoarse voice, leg swelling, palpitations, tremors, visual change or weight gain. The symptoms have been stable. His past medical history is significant for hyperlipidemia. There is no history of diabetes.  Heart Problem  This is a chronic (atrial fibrillation) problem. The current episode started more than 1 year ago. The problem has been unchanged. Associated symptoms include fatigue and vertigo. Pertinent negatives include no abdominal pain, anorexia, arthralgias, change in bowel habit, chest pain, chills, congestion, coughing, diaphoresis, fever, headaches, joint swelling, myalgias, nausea, neck pain, numbness, rash, sore throat, swollen glands, urinary symptoms, visual change, vomiting or weakness. Nothing aggravates the symptoms. He has tried nothing for the symptoms. The treatment provided moderate relief.  Hyperlipidemia  This is a chronic problem. The current episode started more than 1 year ago. The problem is controlled. Recent lipid tests were reviewed and are normal. He has no history of chronic renal disease, diabetes, hypothyroidism, liver disease, obesity or nephrotic syndrome. Pertinent negatives include no chest pain, focal sensory loss, focal weakness, leg pain, myalgias or shortness of breath. Current antihyperlipidemic treatment includes statins. The current treatment provides mild improvement of lipids. There are no compliance problems.     Atrial fibrillation (HCC) Stable  on Eliquist 5 mg. Will continue.  Thrombocytosis (HCC) Controlled on Hydoxyurea 500 mg daily.  B12 deficiency Controlled otc B12 1000 MCG daily  Hyperlipidemia Controlled with Pravastatin 20mg  /will evaluate lipid panel  Prediabetes Will check A1C   CKD (chronic kidney disease) stage 3, GFR 30-59 ml/min (HCC) Will check status with renal panel   Past Medical History:  Diagnosis Date  . A-fib ([place])   . Cancer ([place])    skin cancers  . Coagulation defect ([place])   . Finger injury   . History of heat stroke   . Hyperlipidemia   . Hypertension   . Previous back surgery   . Stroke ([place])   . Vertigo     Past Surgical History:  Procedure Laterality Date  . BACK SURGERY  2003  . FINGER DEBRIDEMENT  1963   Left hand 4 digit   . REPLACEMENT TOTAL KNEE Right 2006    Family History  Problem Relation Age of Onset  . Cancer Mother        breast  . Emphysema Father   . Diabetes Sister   . Paget's disease of bone Brother   . Cancer Sister        lung cancer  . Healthy Sister     Social History   Socioeconomic History  . Marital status: Married    Spouse name: Axel Frisk  . Number of children: 3  . Years of education: Not on file  . Highest education level: 7th grade  Occupational History  . Occupation: Retired  Scientific laboratory technician  . Financial resource strain: Not hard at all  . Food insecurity:    Worry: Never true    Inability: Never true  . Transportation needs:    Medical: No    Non-medical:  No  Tobacco Use  . Smoking status: Never Smoker  . Smokeless tobacco: Never Used  . Tobacco comment: smoking cessation materials not required  Substance and Sexual Activity  . Alcohol use: No    Frequency: Never  . Drug use: No  . Sexual activity: Never  Lifestyle  . Physical activity:    Days per week: 7 days    Minutes per session: 30 min  . Stress: To some extent  Relationships  . Social connections:    Talks on phone: Once a week    Gets together: Once a  week    Attends religious service: Never    Active member of club or organization: No    Attends meetings of clubs or organizations: Never    Relationship status: Married  . Intimate partner violence:    Fear of current or ex partner: No    Emotionally abused: No    Physically abused: No    Forced sexual activity: No  Other Topics Concern  . Not on file  Social History Narrative   Moved here to be close to children. Lived here in [place] many years ago but moved to [facility] and lived there 12 yrs. Has children that visit and call regularly.     No Known Allergies  Outpatient Medications Prior to Visit  Medication Sig Dispense Refill  . acetaminophen (TYLENOL) 500 MG tablet Take 500 mg by mouth every 6 (six) hours as needed.    Marland Kitchen aspirin EC 81 MG tablet Take 81 mg daily by mouth.    . Cholecalciferol (VITAMIN D) 2000 units CAPS Take 1 capsule (2,000 Units total) by mouth daily. 30 capsule   . diclofenac sodium (VOLTAREN) 1 % GEL Apply topically 4 (four) times daily as needed.    . meclizine (ANTIVERT) 25 MG tablet Take 25 mg by mouth daily.     . Multiple Vitamin (MULTIVITAMIN) tablet Take 1 tablet daily by mouth.    Marland Kitchen apixaban (ELIQUIS) 5 MG TABS tablet Take 5 mg 2 (two) times daily by mouth.    . cyanocobalamin 1000 MCG tablet Take 1,000 mcg daily by mouth.    . hydroxyurea (HYDREA) 500 MG capsule Take 500 mg by mouth daily. May take with food to minimize GI side effects.     Marland Kitchen levothyroxine (SYNTHROID, LEVOTHROID) 25 MCG tablet Take 1 tablet (25 mcg total) by mouth daily before breakfast. 30 tablet 0  . metoprolol tartrate (LOPRESSOR) 25 MG tablet Take 12.5 mg 2 (two) times daily by mouth.    . pravastatin (PRAVACHOL) 20 MG tablet Take 20 mg daily by mouth.     No facility-administered medications prior to visit.     Review of Systems  Constitutional: Positive for fatigue and weight loss. Negative for chills, diaphoresis, fever, malaise/fatigue and weight gain.  HENT: Negative for  congestion, ear discharge, ear pain, hoarse voice and sore throat.   Eyes: Negative for blurred vision.  Respiratory: Negative for cough, sputum production, shortness of breath and wheezing.   Cardiovascular: Negative for chest pain, palpitations and leg swelling.  Gastrointestinal: Positive for constipation. Negative for abdominal pain, anorexia, blood in stool, change in bowel habit, diarrhea, heartburn, melena, nausea and vomiting.  Genitourinary: Negative for dysuria, frequency, hematuria and urgency.  Musculoskeletal: Negative for arthralgias, back pain, joint pain, joint swelling, myalgias and neck pain.  Skin: Negative for rash.  Neurological: Positive for vertigo. Negative for dizziness, tingling, tremors, sensory change, focal weakness, weakness, numbness and headaches.  Endo/Heme/Allergies: Positive for  cold intolerance and heat intolerance. Negative for environmental allergies and polydipsia. Does not bruise/bleed easily.  Psychiatric/Behavioral: Negative for depression and suicidal ideas. The patient is not nervous/anxious and does not have insomnia.      Objective  Vitals:   03/24/18 1010  BP: 106/72  Pulse: (!) 54  Resp: 16  SpO2: 98%  Weight: 199 lb (90.3 kg)  Height: 5\' 11"  (1.803 m)    Physical Exam  Constitutional: He is oriented to person, place, and time.  HENT:  Head: Normocephalic.  Right Ear: External ear normal.  Left Ear: External ear normal.  Nose: Nose normal.  Mouth/Throat: Oropharynx is clear and moist.  Eyes: Pupils are equal, round, and reactive to light. Conjunctivae and EOM are normal. Right eye exhibits no discharge. Left eye exhibits no discharge. No scleral icterus.  Neck: Normal range of motion. Neck supple. No JVD present. No tracheal deviation present. No thyromegaly present.  Cardiovascular: Normal rate, regular rhythm, normal heart sounds and intact distal pulses. Exam reveals no gallop and no friction rub.  No murmur  heard. Pulmonary/Chest: Breath sounds normal. No respiratory distress. He has no wheezes. He has no rales.  Abdominal: Soft. Bowel sounds are normal. He exhibits no mass. There is no hepatosplenomegaly. There is no tenderness. There is no rebound, no guarding and no CVA tenderness.  Musculoskeletal: Normal range of motion. He exhibits no edema or tenderness.  Lymphadenopathy:    He has no cervical adenopathy.  Neurological: He is alert and oriented to person, place, and time. He has normal strength and normal reflexes. No cranial nerve deficit.  Skin: Skin is warm. No rash noted.  Nursing note and vitals reviewed.     Assessment & Plan  Problem List Items Addressed This Visit      Cardiovascular and Mediastinum   Atrial fibrillation ([place]) - Primary    Stable on Eliquist 5 mg. Will continue.      Relevant Medications   metoprolol tartrate (LOPRESSOR) 25 MG tablet   pravastatin (PRAVACHOL) 20 MG tablet   apixaban (ELIQUIS) 5 MG TABS tablet     Endocrine   Hypothyroidism   Relevant Medications   metoprolol tartrate (LOPRESSOR) 25 MG tablet   levothyroxine (SYNTHROID, LEVOTHROID) 25 MCG tablet   Other Relevant Orders   TSH     Genitourinary   CKD (chronic kidney disease) stage 3, GFR 30-59 ml/min (HCC)    Will check status with renal panel        Hematopoietic and Hemostatic   Thrombocytosis (HCC)    Controlled on Hydoxyurea 500 mg daily.      Relevant Medications   hydroxyurea (HYDREA) 500 MG capsule   Other Relevant Orders   CBC with Differential/Platelet     Other   Hyperlipidemia    Controlled with Pravastatin 20mg  /will evaluate lipid panel      Relevant Medications   metoprolol tartrate (LOPRESSOR) 25 MG tablet   pravastatin (PRAVACHOL) 20 MG tablet   apixaban (ELIQUIS) 5 MG TABS tablet   Other Relevant Orders   Lipid panel   B12 deficiency    Controlled otc B12 1000 MCG daily      Relevant Medications   cyanocobalamin 1000 MCG tablet    Prediabetes    Will check A1C       Relevant Orders   Renal Function Panel      Meds ordered this encounter  Medications  . metoprolol tartrate (LOPRESSOR) 25 MG tablet    Sig: Take 0.5 tablets (12.5  mg total) by mouth 2 (two) times daily.    Dispense:  90 tablet    Refill:  1  . pravastatin (PRAVACHOL) 20 MG tablet    Sig: Take 1 tablet (20 mg total) by mouth daily.    Dispense:  90 tablet    Refill:  1  . levothyroxine (SYNTHROID, LEVOTHROID) 25 MCG tablet    Sig: Take 1 tablet (25 mcg total) by mouth daily before breakfast.    Dispense:  90 tablet    Refill:  1  . apixaban (ELIQUIS) 5 MG TABS tablet    Sig: Take 1 tablet (5 mg total) by mouth 2 (two) times daily.    Dispense:  60 tablet    Refill:  5  . hydroxyurea (HYDREA) 500 MG capsule    Sig: Take 1 capsule (500 mg total) by mouth daily. May take with food to minimize GI side effects.    Dispense:  90 capsule    Refill:  1  . cyanocobalamin 1000 MCG tablet    Sig: Take 1 tablet (1,000 mcg total) by mouth daily.    Dispense:  100 tablet    Refill:  3      Dr. Otilio Miu [facility]  03/24/18

## 2018-03-24 NOTE — Assessment & Plan Note (Signed)
Controlled otc B12 1000 MCG daily

## 2018-03-24 NOTE — Assessment & Plan Note (Signed)
Controlled on Hydoxyurea 500 mg daily.

## 2018-03-24 NOTE — Assessment & Plan Note (Signed)
Stable on Eliquist 5 mg. Will continue.

## 2018-03-24 NOTE — Assessment & Plan Note (Signed)
Controlled with Pravastatin 20mg  Aletha Halim evaluate lipid panel

## 2018-03-25 LAB — CBC WITH DIFFERENTIAL/PLATELET
BASOS ABS: 0 10*3/uL (ref 0.0–0.2)
Basos: 0 %
EOS (ABSOLUTE): 0.1 10*3/uL (ref 0.0–0.4)
Eos: 2 %
Hematocrit: 38.3 % (ref 37.5–51.0)
Hemoglobin: 13 g/dL (ref 13.0–17.7)
Immature Grans (Abs): 0 10*3/uL (ref 0.0–0.1)
Immature Granulocytes: 0 %
LYMPHS ABS: 2.2 10*3/uL (ref 0.7–3.1)
Lymphs: 24 %
MCH: 41.9 pg — ABNORMAL HIGH (ref 26.6–33.0)
MCHC: 33.9 g/dL (ref 31.5–35.7)
MCV: 124 fL — ABNORMAL HIGH (ref 79–97)
MONOCYTES: 10 %
Monocytes Absolute: 1 10*3/uL — ABNORMAL HIGH (ref 0.1–0.9)
Neutrophils Absolute: 6.1 10*3/uL (ref 1.4–7.0)
Neutrophils: 64 %
PLATELETS: 461 10*3/uL — AB (ref 150–450)
RBC: 3.1 x10E6/uL — ABNORMAL LOW (ref 4.14–5.80)
RDW: 13.3 % (ref 12.3–15.4)
WBC: 9.5 10*3/uL (ref 3.4–10.8)

## 2018-03-25 LAB — RENAL FUNCTION PANEL
ALBUMIN: 4.2 g/dL (ref 3.2–4.6)
BUN/Creatinine Ratio: 16 (ref 10–24)
BUN: 26 mg/dL (ref 10–36)
CO2: 26 mmol/L (ref 20–29)
Calcium: 9.2 mg/dL (ref 8.6–10.2)
Chloride: 104 mmol/L (ref 96–106)
Creatinine, Ser: 1.59 mg/dL — ABNORMAL HIGH (ref 0.76–1.27)
GFR calc Af Amer: 43 mL/min/{1.73_m2} — ABNORMAL LOW (ref >59)
GFR, EST NON AFRICAN AMERICAN: 37 mL/min/{1.73_m2} — AB (ref >59)
Glucose: 87 mg/dL (ref 65–99)
PHOSPHORUS: 3.3 mg/dL (ref 2.5–4.5)
Potassium: 5.2 mmol/L (ref 3.5–5.2)
SODIUM: 143 mmol/L (ref 134–144)

## 2018-03-25 LAB — LIPID PANEL
CHOL/HDL RATIO: 3.1 ratio (ref 0.0–5.0)
Cholesterol, Total: 139 mg/dL (ref 100–199)
HDL: 45 mg/dL (ref >39)
LDL CALC: 73 mg/dL (ref 0–99)
TRIGLYCERIDES: 104 mg/dL (ref 0–149)
VLDL Cholesterol Cal: 21 mg/dL (ref 5–40)

## 2018-03-25 LAB — TSH: TSH: 5.7 u[IU]/mL — ABNORMAL HIGH (ref 0.450–4.500)

## 2018-04-21 ENCOUNTER — Encounter: Payer: Self-pay | Admitting: Family Medicine

## 2018-04-21 ENCOUNTER — Ambulatory Visit (INDEPENDENT_AMBULATORY_CARE_PROVIDER_SITE_OTHER): Payer: Medicare HMO | Admitting: Family Medicine

## 2018-04-21 VITALS — BP 98/68 | HR 63 | Resp 16 | Ht 71.0 in | Wt 191.0 lb

## 2018-04-21 DIAGNOSIS — E860 Dehydration: Secondary | ICD-10-CM

## 2018-04-21 DIAGNOSIS — D473 Essential (hemorrhagic) thrombocythemia: Secondary | ICD-10-CM

## 2018-04-21 DIAGNOSIS — D75839 Thrombocytosis, unspecified: Secondary | ICD-10-CM

## 2018-04-21 DIAGNOSIS — D539 Nutritional anemia, unspecified: Secondary | ICD-10-CM

## 2018-04-21 DIAGNOSIS — N183 Chronic kidney disease, stage 3 unspecified: Secondary | ICD-10-CM

## 2018-04-21 DIAGNOSIS — E538 Deficiency of other specified B group vitamins: Secondary | ICD-10-CM | POA: Diagnosis not present

## 2018-04-21 DIAGNOSIS — R42 Dizziness and giddiness: Secondary | ICD-10-CM

## 2018-04-21 DIAGNOSIS — E039 Hypothyroidism, unspecified: Secondary | ICD-10-CM | POA: Diagnosis not present

## 2018-04-21 NOTE — Assessment & Plan Note (Signed)
Elevated platelets 6/26 presently on Hydrea .will recheck cbc and adjust/heme refer if need.

## 2018-04-21 NOTE — Assessment & Plan Note (Signed)
Recheck cbc and adjust B12 accordingly.

## 2018-04-21 NOTE — Progress Notes (Signed)
Name: Lee Mercado   MRN: [id number]    DOB: 11-Nov-1924   Date:04/21/2018       Progress Note  Subjective  Chief Complaint  Chief Complaint  Patient presents with  . Dizziness    Had dizzy spell 6 days ago legs gave out and he said it comes on fast. Loses strength in lower limbs. Has had this for a few days especually when he turns head fast or gets up.     Dizziness  This is a recurrent problem. The current episode started in the past 7 days. The problem occurs intermittently. The problem has been resolved (quiescent.). Associated symptoms include fatigue, myalgias and vertigo. Pertinent negatives include no abdominal pain, anorexia, arthralgias, change in bowel habit, chest pain, chills, congestion, coughing, diaphoresis, fever, headaches, joint swelling, nausea, neck pain, numbness, rash, sore throat, swollen glands, urinary symptoms, visual change, vomiting or weakness. The symptoms are aggravated by standing (change position/turning head). Treatments tried: meclizine. The treatment provided mild relief.  Thyroid Problem  Presents for follow-up visit. Symptoms include fatigue. Patient reports no anxiety, constipation, depressed mood, diaphoresis, diarrhea, hoarse voice, palpitations, visual change or weight loss. The symptoms have been stable.  Anemia  Presents for follow-up visit. Symptoms include malaise/fatigue. There has been no abdominal pain, anorexia, bruising/bleeding easily, fever, palpitations or weight loss. (Thrombocytosis/and macrocytic anemia) Signs of blood loss that are not present include hematemesis, hematochezia and melena.    Thrombocytosis (HCC) Elevated platelets 6/26 presently on Hydrea .will recheck cbc and adjust/heme refer if need.  CKD (chronic kidney disease) stage 3, GFR 30-59 ml/min (HCC) Exacerbated by dehydration /fluids encuraged.  Hypothyroidism 6/26 19 TSH elevated . Will recheck tsh today and adjust accordingly.  Vertigo Resume Meclizine as  needed  Macrocytic anemia Recheck cbc and adjust B12 accordingly.   Past Medical History:  Diagnosis Date  . A-fib ([place])   . Cancer ([place])    skin cancers  . Coagulation defect ([place])   . Finger injury   . History of heat stroke   . Hyperlipidemia   . Hypertension   . Previous back surgery   . Stroke ([place])   . Vertigo     Past Surgical History:  Procedure Laterality Date  . BACK SURGERY  2003  . FINGER DEBRIDEMENT  1963   Left hand 4 digit   . REPLACEMENT TOTAL KNEE Right 2006    Family History  Problem Relation Age of Onset  . Cancer Mother        breast  . Emphysema Father   . Diabetes Sister   . Paget's disease of bone Brother   . Cancer Sister        lung cancer  . Healthy Sister     Social History   Socioeconomic History  . Marital status: Married    Spouse name: Momodou Consiglio  . Number of children: 3  . Years of education: Not on file  . Highest education level: 7th grade  Occupational History  . Occupation: Retired  Scientific laboratory technician  . Financial resource strain: Not hard at all  . Food insecurity:    Worry: Never true    Inability: Never true  . Transportation needs:    Medical: No    Non-medical: No  Tobacco Use  . Smoking status: Never Smoker  . Smokeless tobacco: Never Used  . Tobacco comment: smoking cessation materials not required  Substance and Sexual Activity  . Alcohol use: No    Frequency: Never  . Drug use:  No  . Sexual activity: Never  Lifestyle  . Physical activity:    Days per week: 7 days    Minutes per session: 30 min  . Stress: To some extent  Relationships  . Social connections:    Talks on phone: Once a week    Gets together: Once a week    Attends religious service: Never    Active member of club or organization: No    Attends meetings of clubs or organizations: Never    Relationship status: Married  . Intimate partner violence:    Fear of current or ex partner: No    Emotionally abused: No    Physically abused:  No    Forced sexual activity: No  Other Topics Concern  . Not on file  Social History Narrative   Moved here to be close to children. Lived here in [place] many years ago but moved to [facility] and lived there 12 yrs. Has children that visit and call regularly.     No Known Allergies  Outpatient Medications Prior to Visit  Medication Sig Dispense Refill  . acetaminophen (TYLENOL) 500 MG tablet Take 500 mg by mouth every 6 (six) hours as needed.    Marland Kitchen apixaban (ELIQUIS) 5 MG TABS tablet Take 1 tablet (5 mg total) by mouth 2 (two) times daily. 60 tablet 5  . aspirin EC 81 MG tablet Take 81 mg by mouth every 6 (six) hours as needed (Takes when dizzy and it helps to clear this up.).     Marland Kitchen Cholecalciferol (VITAMIN D) 2000 units CAPS Take 1 capsule (2,000 Units total) by mouth daily. 30 capsule   . cyanocobalamin 1000 MCG tablet Take 1 tablet (1,000 mcg total) by mouth daily. 100 tablet 3  . diclofenac sodium (VOLTAREN) 1 % GEL Apply topically 4 (four) times daily as needed.    . hydroxyurea (HYDREA) 500 MG capsule Take 1 capsule (500 mg total) by mouth daily. May take with food to minimize GI side effects. 90 capsule 1  . levothyroxine (SYNTHROID, LEVOTHROID) 25 MCG tablet Take 1 tablet (25 mcg total) by mouth daily before breakfast. 90 tablet 1  . meclizine (ANTIVERT) 25 MG tablet Take 25 mg by mouth daily.     . metoprolol tartrate (LOPRESSOR) 25 MG tablet Take 0.5 tablets (12.5 mg total) by mouth 2 (two) times daily. 90 tablet 1  . Multiple Vitamin (MULTIVITAMIN) tablet Take 1 tablet daily by mouth.    . pravastatin (PRAVACHOL) 20 MG tablet Take 1 tablet (20 mg total) by mouth daily. 90 tablet 1   No facility-administered medications prior to visit.     Review of Systems  Constitutional: Positive for fatigue and malaise/fatigue. Negative for chills, diaphoresis, fever and weight loss.  HENT: Negative for congestion, ear discharge, ear pain, hoarse voice and sore throat.   Eyes: Negative for  blurred vision.  Respiratory: Negative for cough, sputum production, shortness of breath and wheezing.   Cardiovascular: Negative for chest pain, palpitations and leg swelling.  Gastrointestinal: Negative for abdominal pain, anorexia, blood in stool, change in bowel habit, constipation, diarrhea, heartburn, hematemesis, hematochezia, melena, nausea and vomiting.  Genitourinary: Negative for dysuria, frequency, hematuria and urgency.  Musculoskeletal: Positive for myalgias. Negative for arthralgias, back pain, joint pain, joint swelling and neck pain.  Skin: Negative for rash.  Neurological: Positive for dizziness and vertigo. Negative for tingling, sensory change, focal weakness, weakness, numbness and headaches.  Endo/Heme/Allergies: Negative for environmental allergies and polydipsia. Does not bruise/bleed easily.  Psychiatric/Behavioral: Negative for depression and suicidal ideas. The patient is not nervous/anxious and does not have insomnia.      Objective  Vitals:   04/21/18 1018  Resp: 16  Weight: 191 lb (86.6 kg)  Height: 5\' 11"  (1.803 m)    Physical Exam  Constitutional: He is oriented to person, place, and time.  HENT:  Head: Normocephalic.  Right Ear: External ear normal.  Left Ear: External ear normal.  Nose: Nose normal.  Mouth/Throat: Oropharynx is clear and moist. Mucous membranes are not pale and dry. No oropharyngeal exudate, posterior oropharyngeal edema or posterior oropharyngeal erythema.  Eyes: Pupils are equal, round, and reactive to light. Conjunctivae and EOM are normal. Right eye exhibits no discharge. Left eye exhibits no discharge. No scleral icterus.  Neck: Normal range of motion. Neck supple. No JVD present. No tracheal deviation present. No thyromegaly present.  Cardiovascular: Normal rate, regular rhythm, S1 normal, S2 normal, normal heart sounds and intact distal pulses. Exam reveals no gallop, no S3, no S4, no distant heart sounds and no friction rub.   No murmur heard. Pulses:      Carotid pulses are 1+ on the right side, and 1+ on the left side.      Radial pulses are 1+ on the right side, and 1+ on the left side.  1+ pretibial edema  Pulmonary/Chest: Breath sounds normal. No respiratory distress. He has no wheezes. He has no rales.  Abdominal: Soft. Bowel sounds are normal. He exhibits no mass. There is no hepatosplenomegaly. There is no tenderness. There is no rebound, no guarding and no CVA tenderness.  Musculoskeletal: Normal range of motion. He exhibits no edema or tenderness.  Lymphadenopathy:    He has no cervical adenopathy.  Neurological: He is alert and oriented to person, place, and time. He has normal strength and normal reflexes. No cranial nerve deficit.  Skin: Skin is warm. No rash noted.  Nursing note and vitals reviewed.     Assessment & Plan  Problem List Items Addressed This Visit      Endocrine   Hypothyroidism    6/26 19 TSH elevated . Will recheck tsh today and adjust accordingly.      Relevant Orders   TSH     Genitourinary   CKD (chronic kidney disease) stage 3, GFR 30-59 ml/min (HCC)    Exacerbated by dehydration /fluids encuraged.      Relevant Orders   Renal Function Panel     Hematopoietic and Hemostatic   Thrombocytosis (HCC)    Elevated platelets 6/26 presently on Hydrea .will recheck cbc and adjust/heme refer if need.      Relevant Orders   CBC with Differential/Platelet     Other   B12 deficiency   Relevant Orders   CBC with Differential/Platelet   Vertigo    Resume Meclizine as needed      Macrocytic anemia    Recheck cbc and adjust B12 accordingly.       Other Visit Diagnoses    Dehydration    -  Primary   Orthostatic BP and pulse suggest dehration. Encourage fluid intake water,tea,or gatorade   Relevant Orders   Renal Function Panel   Orthostatic dizziness       Orthostatic vitals indicate degree of hypotension with change in position.. Correct with increased  fluid intake      No orders of the defined types were placed in this encounter.     Dr. Mac[facility]  04/21/18

## 2018-04-21 NOTE — Assessment & Plan Note (Signed)
Exacerbated by dehydration /fluids encuraged.

## 2018-04-21 NOTE — Assessment & Plan Note (Signed)
6/26 19 TSH elevated . Will recheck tsh today and adjust accordingly.

## 2018-04-21 NOTE — Assessment & Plan Note (Signed)
Resume Meclizine as needed

## 2018-04-22 LAB — CBC WITH DIFFERENTIAL/PLATELET
BASOS ABS: 0 10*3/uL (ref 0.0–0.2)
Basos: 0 %
EOS (ABSOLUTE): 0.2 10*3/uL (ref 0.0–0.4)
Eos: 2 %
Hematocrit: 43 % (ref 37.5–51.0)
Hemoglobin: 14.2 g/dL (ref 13.0–17.7)
Immature Grans (Abs): 0 10*3/uL (ref 0.0–0.1)
Immature Granulocytes: 0 %
Lymphocytes Absolute: 1.8 10*3/uL (ref 0.7–3.1)
Lymphs: 18 %
MCH: 40 pg — AB (ref 26.6–33.0)
MCHC: 33 g/dL (ref 31.5–35.7)
MCV: 121 fL — ABNORMAL HIGH (ref 79–97)
MONOS ABS: 1.1 10*3/uL — AB (ref 0.1–0.9)
Monocytes: 11 %
Neutrophils Absolute: 7 10*3/uL (ref 1.4–7.0)
Neutrophils: 69 %
PLATELETS: 535 10*3/uL — AB (ref 150–450)
RBC: 3.55 x10E6/uL — AB (ref 4.14–5.80)
RDW: 14.1 % (ref 12.3–15.4)
WBC: 10.2 10*3/uL (ref 3.4–10.8)

## 2018-04-22 LAB — RENAL FUNCTION PANEL
ALBUMIN: 4.1 g/dL (ref 3.2–4.6)
BUN/Creatinine Ratio: 17 (ref 10–24)
BUN: 25 mg/dL (ref 10–36)
CO2: 23 mmol/L (ref 20–29)
CREATININE: 1.47 mg/dL — AB (ref 0.76–1.27)
Calcium: 9.3 mg/dL (ref 8.6–10.2)
Chloride: 102 mmol/L (ref 96–106)
GFR calc non Af Amer: 41 mL/min/{1.73_m2} — ABNORMAL LOW (ref >59)
GFR, EST AFRICAN AMERICAN: 48 mL/min/{1.73_m2} — AB (ref >59)
GLUCOSE: 86 mg/dL (ref 65–99)
Phosphorus: 3.1 mg/dL (ref 2.5–4.5)
Potassium: 5.6 mmol/L — ABNORMAL HIGH (ref 3.5–5.2)
SODIUM: 142 mmol/L (ref 134–144)

## 2018-04-22 LAB — TSH: TSH: 3.25 u[IU]/mL (ref 0.450–4.500)

## 2018-07-03 DIAGNOSIS — R42 Dizziness and giddiness: Secondary | ICD-10-CM | POA: Diagnosis not present

## 2018-07-03 DIAGNOSIS — D473 Essential (hemorrhagic) thrombocythemia: Secondary | ICD-10-CM | POA: Diagnosis not present

## 2018-07-03 DIAGNOSIS — E785 Hyperlipidemia, unspecified: Secondary | ICD-10-CM | POA: Diagnosis not present

## 2018-07-03 DIAGNOSIS — I10 Essential (primary) hypertension: Secondary | ICD-10-CM | POA: Diagnosis not present

## 2018-07-03 DIAGNOSIS — M1990 Unspecified osteoarthritis, unspecified site: Secondary | ICD-10-CM | POA: Diagnosis not present

## 2018-07-03 DIAGNOSIS — Z23 Encounter for immunization: Secondary | ICD-10-CM | POA: Diagnosis not present

## 2018-07-03 DIAGNOSIS — N183 Chronic kidney disease, stage 3 (moderate): Secondary | ICD-10-CM | POA: Diagnosis not present

## 2018-07-03 DIAGNOSIS — E875 Hyperkalemia: Secondary | ICD-10-CM | POA: Diagnosis not present

## 2018-07-03 DIAGNOSIS — E559 Vitamin D deficiency, unspecified: Secondary | ICD-10-CM | POA: Diagnosis not present

## 2018-07-03 DIAGNOSIS — E079 Disorder of thyroid, unspecified: Secondary | ICD-10-CM | POA: Diagnosis not present

## 2018-07-09 ENCOUNTER — Ambulatory Visit: Payer: Medicare HMO | Admitting: Family Medicine

## 2018-08-10 DIAGNOSIS — D473 Essential (hemorrhagic) thrombocythemia: Secondary | ICD-10-CM | POA: Diagnosis not present

## 2018-08-10 DIAGNOSIS — E875 Hyperkalemia: Secondary | ICD-10-CM | POA: Diagnosis not present

## 2018-08-17 DIAGNOSIS — D473 Essential (hemorrhagic) thrombocythemia: Secondary | ICD-10-CM | POA: Diagnosis not present

## 2018-08-17 DIAGNOSIS — N183 Chronic kidney disease, stage 3 (moderate): Secondary | ICD-10-CM | POA: Diagnosis not present

## 2018-09-25 ENCOUNTER — Other Ambulatory Visit: Payer: Self-pay | Admitting: Family Medicine

## 2018-09-25 DIAGNOSIS — E039 Hypothyroidism, unspecified: Secondary | ICD-10-CM

## 2018-10-02 DIAGNOSIS — I10 Essential (primary) hypertension: Secondary | ICD-10-CM | POA: Diagnosis not present

## 2018-10-02 DIAGNOSIS — M1990 Unspecified osteoarthritis, unspecified site: Secondary | ICD-10-CM | POA: Diagnosis not present

## 2018-10-02 DIAGNOSIS — D7589 Other specified diseases of blood and blood-forming organs: Secondary | ICD-10-CM | POA: Diagnosis not present

## 2018-10-02 DIAGNOSIS — E559 Vitamin D deficiency, unspecified: Secondary | ICD-10-CM | POA: Diagnosis not present

## 2018-10-02 DIAGNOSIS — N183 Chronic kidney disease, stage 3 (moderate): Secondary | ICD-10-CM | POA: Diagnosis not present

## 2018-10-02 DIAGNOSIS — E079 Disorder of thyroid, unspecified: Secondary | ICD-10-CM | POA: Diagnosis not present

## 2018-10-02 DIAGNOSIS — D473 Essential (hemorrhagic) thrombocythemia: Secondary | ICD-10-CM | POA: Diagnosis not present

## 2018-10-02 DIAGNOSIS — E785 Hyperlipidemia, unspecified: Secondary | ICD-10-CM | POA: Diagnosis not present

## 2018-10-02 DIAGNOSIS — Z23 Encounter for immunization: Secondary | ICD-10-CM | POA: Diagnosis not present

## 2018-10-15 DIAGNOSIS — M1712 Unilateral primary osteoarthritis, left knee: Secondary | ICD-10-CM | POA: Diagnosis not present

## 2018-11-20 ENCOUNTER — Other Ambulatory Visit: Payer: Self-pay | Admitting: Family Medicine

## 2018-11-20 DIAGNOSIS — I480 Paroxysmal atrial fibrillation: Secondary | ICD-10-CM

## 2018-11-30 DIAGNOSIS — D473 Essential (hemorrhagic) thrombocythemia: Secondary | ICD-10-CM | POA: Diagnosis not present

## 2018-12-18 ENCOUNTER — Other Ambulatory Visit: Payer: Self-pay | Admitting: Family Medicine

## 2018-12-18 DIAGNOSIS — E785 Hyperlipidemia, unspecified: Secondary | ICD-10-CM

## 2019-02-05 DIAGNOSIS — N183 Chronic kidney disease, stage 3 (moderate): Secondary | ICD-10-CM | POA: Diagnosis not present

## 2019-02-05 DIAGNOSIS — D7589 Other specified diseases of blood and blood-forming organs: Secondary | ICD-10-CM | POA: Diagnosis not present

## 2019-02-05 DIAGNOSIS — E559 Vitamin D deficiency, unspecified: Secondary | ICD-10-CM | POA: Diagnosis not present

## 2019-02-05 DIAGNOSIS — I10 Essential (primary) hypertension: Secondary | ICD-10-CM | POA: Diagnosis not present

## 2019-02-05 DIAGNOSIS — M1990 Unspecified osteoarthritis, unspecified site: Secondary | ICD-10-CM | POA: Diagnosis not present

## 2019-02-05 DIAGNOSIS — E079 Disorder of thyroid, unspecified: Secondary | ICD-10-CM | POA: Diagnosis not present

## 2019-02-05 DIAGNOSIS — D473 Essential (hemorrhagic) thrombocythemia: Secondary | ICD-10-CM | POA: Diagnosis not present

## 2019-02-12 ENCOUNTER — Other Ambulatory Visit: Payer: Self-pay | Admitting: Family Medicine

## 2019-02-12 DIAGNOSIS — I480 Paroxysmal atrial fibrillation: Secondary | ICD-10-CM

## 2019-04-13 DIAGNOSIS — M1712 Unilateral primary osteoarthritis, left knee: Secondary | ICD-10-CM | POA: Diagnosis not present

## 2019-04-22 ENCOUNTER — Other Ambulatory Visit: Payer: Self-pay | Admitting: Family Medicine

## 2019-04-22 DIAGNOSIS — E039 Hypothyroidism, unspecified: Secondary | ICD-10-CM

## 2019-04-23 ENCOUNTER — Other Ambulatory Visit: Payer: Self-pay | Admitting: Family Medicine

## 2019-04-23 DIAGNOSIS — E039 Hypothyroidism, unspecified: Secondary | ICD-10-CM

## 2019-05-31 DIAGNOSIS — D473 Essential (hemorrhagic) thrombocythemia: Secondary | ICD-10-CM | POA: Diagnosis not present

## 2019-05-31 DIAGNOSIS — R42 Dizziness and giddiness: Secondary | ICD-10-CM | POA: Diagnosis not present

## 2019-06-11 DIAGNOSIS — D473 Essential (hemorrhagic) thrombocythemia: Secondary | ICD-10-CM | POA: Diagnosis not present

## 2019-06-11 DIAGNOSIS — Z23 Encounter for immunization: Secondary | ICD-10-CM | POA: Diagnosis not present

## 2019-06-11 DIAGNOSIS — K5900 Constipation, unspecified: Secondary | ICD-10-CM | POA: Diagnosis not present

## 2019-06-11 DIAGNOSIS — E559 Vitamin D deficiency, unspecified: Secondary | ICD-10-CM | POA: Diagnosis not present

## 2019-06-11 DIAGNOSIS — R634 Abnormal weight loss: Secondary | ICD-10-CM | POA: Diagnosis not present

## 2019-06-11 DIAGNOSIS — N183 Chronic kidney disease, stage 3 (moderate): Secondary | ICD-10-CM | POA: Diagnosis not present

## 2019-06-11 DIAGNOSIS — D7589 Other specified diseases of blood and blood-forming organs: Secondary | ICD-10-CM | POA: Diagnosis not present

## 2019-06-11 DIAGNOSIS — I10 Essential (primary) hypertension: Secondary | ICD-10-CM | POA: Diagnosis not present

## 2019-06-11 DIAGNOSIS — E785 Hyperlipidemia, unspecified: Secondary | ICD-10-CM | POA: Diagnosis not present

## 2019-06-11 DIAGNOSIS — E079 Disorder of thyroid, unspecified: Secondary | ICD-10-CM | POA: Diagnosis not present

## 2019-07-21 DIAGNOSIS — D473 Essential (hemorrhagic) thrombocythemia: Secondary | ICD-10-CM | POA: Diagnosis not present

## 2019-07-26 DIAGNOSIS — D473 Essential (hemorrhagic) thrombocythemia: Secondary | ICD-10-CM | POA: Diagnosis not present

## 2020-01-18 DIAGNOSIS — N183 Chronic kidney disease, stage 3 unspecified: Secondary | ICD-10-CM | POA: Diagnosis not present

## 2020-01-18 DIAGNOSIS — D539 Nutritional anemia, unspecified: Secondary | ICD-10-CM | POA: Diagnosis not present

## 2020-01-18 DIAGNOSIS — Z8673 Personal history of transient ischemic attack (TIA), and cerebral infarction without residual deficits: Secondary | ICD-10-CM | POA: Diagnosis not present

## 2020-01-18 DIAGNOSIS — D473 Essential (hemorrhagic) thrombocythemia: Secondary | ICD-10-CM | POA: Diagnosis not present

## 2020-01-18 DIAGNOSIS — E039 Hypothyroidism, unspecified: Secondary | ICD-10-CM | POA: Diagnosis not present

## 2020-03-13 DIAGNOSIS — Z791 Long term (current) use of non-steroidal anti-inflammatories (NSAID): Secondary | ICD-10-CM | POA: Diagnosis not present

## 2020-03-13 DIAGNOSIS — D473 Essential (hemorrhagic) thrombocythemia: Secondary | ICD-10-CM | POA: Diagnosis not present

## 2020-03-13 DIAGNOSIS — E559 Vitamin D deficiency, unspecified: Secondary | ICD-10-CM | POA: Diagnosis not present

## 2020-03-13 DIAGNOSIS — Z7901 Long term (current) use of anticoagulants: Secondary | ICD-10-CM | POA: Diagnosis not present

## 2020-03-13 DIAGNOSIS — E079 Disorder of thyroid, unspecified: Secondary | ICD-10-CM | POA: Diagnosis not present

## 2020-03-13 DIAGNOSIS — Z79899 Other long term (current) drug therapy: Secondary | ICD-10-CM | POA: Diagnosis not present

## 2020-03-13 DIAGNOSIS — R42 Dizziness and giddiness: Secondary | ICD-10-CM | POA: Diagnosis not present

## 2020-03-13 DIAGNOSIS — N189 Chronic kidney disease, unspecified: Secondary | ICD-10-CM | POA: Diagnosis not present

## 2020-03-13 DIAGNOSIS — E785 Hyperlipidemia, unspecified: Secondary | ICD-10-CM | POA: Diagnosis not present

## 2020-03-13 DIAGNOSIS — Z87891 Personal history of nicotine dependence: Secondary | ICD-10-CM | POA: Diagnosis not present

## 2020-03-13 DIAGNOSIS — M1990 Unspecified osteoarthritis, unspecified site: Secondary | ICD-10-CM | POA: Diagnosis not present

## 2020-03-13 DIAGNOSIS — I129 Hypertensive chronic kidney disease with stage 1 through stage 4 chronic kidney disease, or unspecified chronic kidney disease: Secondary | ICD-10-CM | POA: Diagnosis not present

## 2020-03-13 DIAGNOSIS — L989 Disorder of the skin and subcutaneous tissue, unspecified: Secondary | ICD-10-CM | POA: Diagnosis not present

## 2020-03-20 DIAGNOSIS — H25813 Combined forms of age-related cataract, bilateral: Secondary | ICD-10-CM | POA: Diagnosis not present

## 2020-03-28 DIAGNOSIS — R262 Difficulty in walking, not elsewhere classified: Secondary | ICD-10-CM | POA: Diagnosis not present

## 2020-03-28 DIAGNOSIS — M6281 Muscle weakness (generalized): Secondary | ICD-10-CM | POA: Diagnosis not present

## 2020-03-28 DIAGNOSIS — R42 Dizziness and giddiness: Secondary | ICD-10-CM | POA: Diagnosis not present

## 2020-04-17 DIAGNOSIS — D473 Essential (hemorrhagic) thrombocythemia: Secondary | ICD-10-CM | POA: Diagnosis not present

## 2020-04-17 DIAGNOSIS — D049 Carcinoma in situ of skin, unspecified: Secondary | ICD-10-CM | POA: Diagnosis not present

## 2020-04-21 DIAGNOSIS — E559 Vitamin D deficiency, unspecified: Secondary | ICD-10-CM | POA: Diagnosis not present

## 2020-04-21 DIAGNOSIS — Z9181 History of falling: Secondary | ICD-10-CM | POA: Diagnosis not present

## 2020-04-21 DIAGNOSIS — Z8673 Personal history of transient ischemic attack (TIA), and cerebral infarction without residual deficits: Secondary | ICD-10-CM | POA: Diagnosis not present

## 2020-04-21 DIAGNOSIS — R42 Dizziness and giddiness: Secondary | ICD-10-CM | POA: Diagnosis not present

## 2020-04-21 DIAGNOSIS — I10 Essential (primary) hypertension: Secondary | ICD-10-CM | POA: Diagnosis not present

## 2020-05-05 DIAGNOSIS — H2513 Age-related nuclear cataract, bilateral: Secondary | ICD-10-CM | POA: Diagnosis not present

## 2020-05-08 DIAGNOSIS — D492 Neoplasm of unspecified behavior of bone, soft tissue, and skin: Secondary | ICD-10-CM | POA: Diagnosis not present

## 2020-05-08 DIAGNOSIS — D0439 Carcinoma in situ of skin of other parts of face: Secondary | ICD-10-CM | POA: Diagnosis not present

## 2020-05-08 DIAGNOSIS — L821 Other seborrheic keratosis: Secondary | ICD-10-CM | POA: Diagnosis not present

## 2020-05-08 DIAGNOSIS — L814 Other melanin hyperpigmentation: Secondary | ICD-10-CM | POA: Diagnosis not present

## 2020-05-08 DIAGNOSIS — C4441 Basal cell carcinoma of skin of scalp and neck: Secondary | ICD-10-CM | POA: Diagnosis not present

## 2020-05-08 DIAGNOSIS — L578 Other skin changes due to chronic exposure to nonionizing radiation: Secondary | ICD-10-CM | POA: Diagnosis not present

## 2020-05-08 DIAGNOSIS — L570 Actinic keratosis: Secondary | ICD-10-CM | POA: Diagnosis not present

## 2020-06-20 DIAGNOSIS — Z01812 Encounter for preprocedural laboratory examination: Secondary | ICD-10-CM | POA: Diagnosis not present

## 2020-06-20 DIAGNOSIS — Z20822 Contact with and (suspected) exposure to covid-19: Secondary | ICD-10-CM | POA: Diagnosis not present

## 2020-06-22 DIAGNOSIS — Z8673 Personal history of transient ischemic attack (TIA), and cerebral infarction without residual deficits: Secondary | ICD-10-CM | POA: Diagnosis not present

## 2020-06-22 DIAGNOSIS — E039 Hypothyroidism, unspecified: Secondary | ICD-10-CM | POA: Diagnosis not present

## 2020-06-22 DIAGNOSIS — N183 Chronic kidney disease, stage 3 unspecified: Secondary | ICD-10-CM | POA: Diagnosis not present

## 2020-06-22 DIAGNOSIS — Z7901 Long term (current) use of anticoagulants: Secondary | ICD-10-CM | POA: Diagnosis not present

## 2020-06-22 DIAGNOSIS — E785 Hyperlipidemia, unspecified: Secondary | ICD-10-CM | POA: Diagnosis not present

## 2020-06-22 DIAGNOSIS — I129 Hypertensive chronic kidney disease with stage 1 through stage 4 chronic kidney disease, or unspecified chronic kidney disease: Secondary | ICD-10-CM | POA: Diagnosis not present

## 2020-06-22 DIAGNOSIS — H2512 Age-related nuclear cataract, left eye: Secondary | ICD-10-CM | POA: Diagnosis not present

## 2020-07-05 DIAGNOSIS — Z23 Encounter for immunization: Secondary | ICD-10-CM | POA: Diagnosis not present

## 2020-07-11 DIAGNOSIS — M1712 Unilateral primary osteoarthritis, left knee: Secondary | ICD-10-CM | POA: Diagnosis not present

## 2020-07-18 DIAGNOSIS — C4441 Basal cell carcinoma of skin of scalp and neck: Secondary | ICD-10-CM | POA: Diagnosis not present

## 2020-07-18 DIAGNOSIS — L814 Other melanin hyperpigmentation: Secondary | ICD-10-CM | POA: Diagnosis not present

## 2020-07-18 DIAGNOSIS — C44311 Basal cell carcinoma of skin of nose: Secondary | ICD-10-CM | POA: Diagnosis not present

## 2020-07-18 DIAGNOSIS — L578 Other skin changes due to chronic exposure to nonionizing radiation: Secondary | ICD-10-CM | POA: Diagnosis not present

## 2020-07-18 DIAGNOSIS — Z85828 Personal history of other malignant neoplasm of skin: Secondary | ICD-10-CM | POA: Diagnosis not present

## 2021-01-01 ENCOUNTER — Inpatient Hospital Stay
Admission: EM | Admit: 2021-01-01 | Discharge: 2021-01-04 | DRG: 189 | Disposition: A | Discharge status: Skilled Nursing Facility | Payer: Medicare Other | Attending: Internal Medicine | Admitting: Internal Medicine

## 2021-01-01 ENCOUNTER — Emergency Department: Payer: Medicare Other

## 2021-01-01 DIAGNOSIS — J209 Acute bronchitis, unspecified: Secondary | ICD-10-CM | POA: Diagnosis present

## 2021-01-01 DIAGNOSIS — Z96651 Presence of right artificial knee joint: Secondary | ICD-10-CM | POA: Diagnosis present

## 2021-01-01 DIAGNOSIS — D473 Essential (hemorrhagic) thrombocythemia: Secondary | ICD-10-CM | POA: Diagnosis present

## 2021-01-01 DIAGNOSIS — Y9301 Activity, walking, marching and hiking: Secondary | ICD-10-CM | POA: Diagnosis present

## 2021-01-01 DIAGNOSIS — Z79890 Hormone replacement therapy (postmenopausal): Secondary | ICD-10-CM

## 2021-01-01 DIAGNOSIS — J40 Bronchitis, not specified as acute or chronic: Secondary | ICD-10-CM

## 2021-01-01 DIAGNOSIS — R7303 Prediabetes: Secondary | ICD-10-CM | POA: Diagnosis present

## 2021-01-01 DIAGNOSIS — R911 Solitary pulmonary nodule: Secondary | ICD-10-CM | POA: Diagnosis present

## 2021-01-01 DIAGNOSIS — Z7982 Long term (current) use of aspirin: Secondary | ICD-10-CM | POA: Diagnosis not present

## 2021-01-01 DIAGNOSIS — I700 Atherosclerosis of aorta: Secondary | ICD-10-CM | POA: Diagnosis present

## 2021-01-01 DIAGNOSIS — N1831 Chronic kidney disease, stage 3a: Secondary | ICD-10-CM | POA: Diagnosis present

## 2021-01-01 DIAGNOSIS — Z85828 Personal history of other malignant neoplasm of skin: Secondary | ICD-10-CM | POA: Diagnosis not present

## 2021-01-01 DIAGNOSIS — J9600 Acute respiratory failure, unspecified whether with hypoxia or hypercapnia: Secondary | ICD-10-CM | POA: Diagnosis present

## 2021-01-01 DIAGNOSIS — Y92009 Unspecified place in unspecified non-institutional (private) residence as the place of occurrence of the external cause: Secondary | ICD-10-CM | POA: Diagnosis not present

## 2021-01-01 DIAGNOSIS — Z20822 Contact with and (suspected) exposure to covid-19: Secondary | ICD-10-CM | POA: Diagnosis present

## 2021-01-01 DIAGNOSIS — Z8673 Personal history of transient ischemic attack (TIA), and cerebral infarction without residual deficits: Secondary | ICD-10-CM | POA: Diagnosis not present

## 2021-01-01 DIAGNOSIS — I4510 Unspecified right bundle-branch block: Secondary | ICD-10-CM | POA: Diagnosis present

## 2021-01-01 DIAGNOSIS — W19XXXA Unspecified fall, initial encounter: Secondary | ICD-10-CM | POA: Diagnosis present

## 2021-01-01 DIAGNOSIS — Z79899 Other long term (current) drug therapy: Secondary | ICD-10-CM

## 2021-01-01 DIAGNOSIS — I129 Hypertensive chronic kidney disease with stage 1 through stage 4 chronic kidney disease, or unspecified chronic kidney disease: Secondary | ICD-10-CM | POA: Diagnosis present

## 2021-01-01 DIAGNOSIS — J42 Unspecified chronic bronchitis: Secondary | ICD-10-CM | POA: Diagnosis present

## 2021-01-01 DIAGNOSIS — N183 Chronic kidney disease, stage 3 unspecified: Secondary | ICD-10-CM | POA: Diagnosis present

## 2021-01-01 DIAGNOSIS — M4854XA Collapsed vertebra, not elsewhere classified, thoracic region, initial encounter for fracture: Secondary | ICD-10-CM | POA: Diagnosis present

## 2021-01-01 DIAGNOSIS — J9601 Acute respiratory failure with hypoxia: Secondary | ICD-10-CM | POA: Diagnosis present

## 2021-01-01 DIAGNOSIS — R600 Localized edema: Secondary | ICD-10-CM

## 2021-01-01 DIAGNOSIS — Z801 Family history of malignant neoplasm of trachea, bronchus and lung: Secondary | ICD-10-CM

## 2021-01-01 DIAGNOSIS — Z7901 Long term (current) use of anticoagulants: Secondary | ICD-10-CM | POA: Diagnosis not present

## 2021-01-01 DIAGNOSIS — I480 Paroxysmal atrial fibrillation: Secondary | ICD-10-CM | POA: Diagnosis present

## 2021-01-01 DIAGNOSIS — I4891 Unspecified atrial fibrillation: Secondary | ICD-10-CM | POA: Diagnosis present

## 2021-01-01 DIAGNOSIS — R42 Dizziness and giddiness: Secondary | ICD-10-CM | POA: Diagnosis present

## 2021-01-01 DIAGNOSIS — I82409 Acute embolism and thrombosis of unspecified deep veins of unspecified lower extremity: Secondary | ICD-10-CM

## 2021-01-01 DIAGNOSIS — E039 Hypothyroidism, unspecified: Secondary | ICD-10-CM | POA: Diagnosis present

## 2021-01-01 DIAGNOSIS — Z833 Family history of diabetes mellitus: Secondary | ICD-10-CM | POA: Diagnosis not present

## 2021-01-01 DIAGNOSIS — E785 Hyperlipidemia, unspecified: Secondary | ICD-10-CM | POA: Diagnosis present

## 2021-01-01 DIAGNOSIS — Z825 Family history of asthma and other chronic lower respiratory diseases: Secondary | ICD-10-CM

## 2021-01-01 LAB — CBC
HCT: 31.3 % — ABNORMAL LOW (ref 39.0–52.0)
Hemoglobin: 10.8 g/dL — ABNORMAL LOW (ref 13.0–17.0)
MCH: 46.6 pg — ABNORMAL HIGH (ref 26.0–34.0)
MCHC: 34.5 g/dL (ref 30.0–36.0)
MCV: 134.9 fL — ABNORMAL HIGH (ref 80.0–100.0)
Platelets: 407 10*3/uL — ABNORMAL HIGH (ref 150–400)
RBC: 2.32 MIL/uL — ABNORMAL LOW (ref 4.22–5.81)
RDW: 13.1 % (ref 11.5–15.5)
WBC: 9.1 10*3/uL (ref 4.0–10.5)
nRBC: 0 % (ref 0.0–0.2)

## 2021-01-01 LAB — CK: Total CK: 134 U/L (ref 49–397)

## 2021-01-01 LAB — COMPREHENSIVE METABOLIC PANEL
ALT: 16 U/L (ref 0–44)
AST: 29 U/L (ref 15–41)
Albumin: 3.6 g/dL (ref 3.5–5.0)
Alkaline Phosphatase: 76 U/L (ref 38–126)
Anion gap: 9 (ref 5–15)
BUN: 40 mg/dL — ABNORMAL HIGH (ref 8–23)
CO2: 22 mmol/L (ref 22–32)
Calcium: 9.1 mg/dL (ref 8.9–10.3)
Chloride: 104 mmol/L (ref 98–111)
Creatinine, Ser: 1.28 mg/dL — ABNORMAL HIGH (ref 0.61–1.24)
GFR, Estimated: 52 mL/min — ABNORMAL LOW (ref >60)
Glucose, Bld: 113 mg/dL — ABNORMAL HIGH (ref 70–99)
Potassium: 4.4 mmol/L (ref 3.5–5.1)
Sodium: 135 mmol/L (ref 135–145)
Total Bilirubin: 0.8 mg/dL (ref 0.3–1.2)
Total Protein: 7.1 g/dL (ref 6.5–8.1)

## 2021-01-01 LAB — RESP PANEL BY RT-PCR (FLU A&B, COVID) ARPGX2
Influenza A by PCR: NEGATIVE
Influenza B by PCR: NEGATIVE
SARS Coronavirus 2 by RT PCR: NEGATIVE

## 2021-01-01 LAB — LACTIC ACID, PLASMA
Lactic Acid, Venous: 1.3 mmol/L (ref 0.5–1.9)
Lactic Acid, Venous: 2.6 mmol/L (ref 0.5–1.9)
Lactic Acid, Venous: 1.6 mmol/L (ref 0.5–1.9)
Lactic Acid, Venous: 1.2 mmol/L (ref 0.5–1.9)

## 2021-01-01 LAB — HIV ANTIBODY (ROUTINE TESTING W REFLEX): HIV Screen 4th Generation wRfx: NONREACTIVE

## 2021-01-01 MED ORDER — BUDESONIDE 0.5 MG/2ML IN SUSP
2.0000 mg | Freq: Two times a day (BID) | RESPIRATORY_TRACT | Status: DC
  Administered 2021-01-01 – 2021-01-02 (×4): 2 mg via RESPIRATORY_TRACT
  Administered 2021-01-03: 0.5 mg via RESPIRATORY_TRACT
  Filled 2021-01-01 (×6): qty 8

## 2021-01-01 MED ORDER — ONDANSETRON HCL 4 MG PO TABS
4.0000 mg | ORAL_TABLET | Freq: Four times a day (QID) | ORAL | Status: DC | PRN
Start: 2021-01-01 — End: 2021-01-04

## 2021-01-01 MED ORDER — LEVOTHYROXINE SODIUM 25 MCG PO TABS
25.0000 ug | ORAL_TABLET | Freq: Every day | ORAL | Status: DC
  Administered 2021-01-02 – 2021-01-04 (×3): 25 ug via ORAL
  Filled 2021-01-01 (×3): qty 1

## 2021-01-01 MED ORDER — APIXABAN 5 MG PO TABS
5.0000 mg | ORAL_TABLET | Freq: Two times a day (BID) | ORAL | Status: DC
  Administered 2021-01-01 – 2021-01-04 (×6): 5 mg via ORAL
  Filled 2021-01-01 (×6): qty 1

## 2021-01-01 MED ORDER — ACETAMINOPHEN 500 MG PO TABS: 500.0000 mg | ORAL_TABLET | Freq: Four times a day (QID) | ORAL | Status: DC | PRN

## 2021-01-01 MED ORDER — SODIUM CHLORIDE 0.9% FLUSH
3.0000 mL | INTRAVENOUS | Status: DC | PRN
  Administered 2021-01-01: 3 mL via INTRAVENOUS

## 2021-01-01 MED ORDER — METOPROLOL TARTRATE 25 MG PO TABS
12.5000 mg | ORAL_TABLET | Freq: Two times a day (BID) | ORAL | Status: DC
  Administered 2021-01-01 – 2021-01-04 (×7): 12.5 mg via ORAL
  Filled 2021-01-01 (×7): qty 1

## 2021-01-01 MED ORDER — SODIUM CHLORIDE 0.9 % IV SOLN: 250.0000 mL | INTRAVENOUS | Status: DC | PRN

## 2021-01-01 MED ORDER — GUAIFENESIN 100 MG/5ML PO SOLN
5.0000 mL | ORAL | Status: DC | PRN
  Administered 2021-01-01 – 2021-01-03 (×3): 100 mg via ORAL
  Filled 2021-01-01 (×3): qty 10

## 2021-01-01 MED ORDER — MECLIZINE HCL 25 MG PO TABS
25.0000 mg | ORAL_TABLET | Freq: Every day | ORAL | Status: DC
  Administered 2021-01-01 – 2021-01-04 (×4): 25 mg via ORAL
  Filled 2021-01-01 (×4): qty 1

## 2021-01-01 MED ORDER — SODIUM CHLORIDE 0.9% FLUSH: 3.0000 mL | Freq: Two times a day (BID) | INTRAVENOUS | Status: DC

## 2021-01-01 MED ORDER — VITAMIN B-12 1000 MCG PO TABS
1000.0000 ug | ORAL_TABLET | Freq: Every day | ORAL | Status: DC
  Administered 2021-01-01 – 2021-01-04 (×4): 1000 ug via ORAL
  Filled 2021-01-01 (×4): qty 1

## 2021-01-01 MED ORDER — HYDROXYUREA 500 MG PO CAPS
500.0000 mg | ORAL_CAPSULE | Freq: Every day | ORAL | Status: DC
  Administered 2021-01-01 – 2021-01-04 (×4): 500 mg via ORAL
  Filled 2021-01-01 (×4): qty 1

## 2021-01-01 MED ORDER — SODIUM CHLORIDE 0.9 % IV SOLN: INTRAVENOUS | Status: DC

## 2021-01-01 MED ORDER — DICLOFENAC SODIUM 1 % EX GEL
2.0000 g | Freq: Four times a day (QID) | CUTANEOUS | Status: DC | PRN
  Filled 2021-01-01: qty 100

## 2021-01-01 MED ORDER — METHYLPREDNISOLONE SODIUM SUCC 125 MG IJ SOLR
125.0000 mg | Freq: Once | INTRAMUSCULAR | Status: AC
  Administered 2021-01-01: 125 mg via INTRAVENOUS
  Filled 2021-01-01: qty 2

## 2021-01-01 MED ORDER — ONDANSETRON HCL 4 MG/2ML IJ SOLN: 4.0000 mg | Freq: Four times a day (QID) | INTRAMUSCULAR | Status: DC | PRN

## 2021-01-01 MED ORDER — VITAMIN D 25 MCG (1000 UNIT) PO TABS
1000.0000 [IU] | ORAL_TABLET | Freq: Every day | ORAL | Status: DC
  Administered 2021-01-01 – 2021-01-04 (×4): 1000 [IU] via ORAL
  Filled 2021-01-01 (×4): qty 1

## 2021-01-01 MED ORDER — BACITRACIN ZINC 500 UNIT/GM EX OINT
TOPICAL_OINTMENT | CUTANEOUS | Status: AC
  Administered 2021-01-01: 1 via TOPICAL
  Filled 2021-01-01: qty 0.9

## 2021-01-01 MED ORDER — IPRATROPIUM-ALBUTEROL 0.5-2.5 (3) MG/3ML IN SOLN
3.0000 mL | Freq: Once | RESPIRATORY_TRACT | Status: AC
  Administered 2021-01-01: 3 mL via RESPIRATORY_TRACT
  Filled 2021-01-01: qty 3

## 2021-01-01 MED ORDER — ADULT MULTIVITAMIN W/MINERALS CH
1.0000 | ORAL_TABLET | Freq: Every day | ORAL | Status: DC
  Administered 2021-01-01 – 2021-01-04 (×4): 1 via ORAL
  Filled 2021-01-01 (×4): qty 1

## 2021-01-01 MED ORDER — ASPIRIN EC 81 MG PO TBEC: 81.0000 mg | DELAYED_RELEASE_TABLET | Freq: Four times a day (QID) | ORAL | Status: DC | PRN

## 2021-01-01 MED ORDER — IPRATROPIUM-ALBUTEROL 0.5-2.5 (3) MG/3ML IN SOLN: 3.0000 mL | Freq: Four times a day (QID) | RESPIRATORY_TRACT | Status: DC | PRN

## 2021-01-01 MED ORDER — PRAVASTATIN SODIUM 20 MG PO TABS
20.0000 mg | ORAL_TABLET | Freq: Every day | ORAL | Status: DC
  Administered 2021-01-01 – 2021-01-04 (×4): 20 mg via ORAL
  Filled 2021-01-01 (×4): qty 1

## 2021-01-01 NOTE — Consult Note (Signed)
[facility] Vascular Consult Note  MRN : [id number]  Lee Mercado is a 87 y.o. (07/01/1925) male who presents with chief complaint of  Chief Complaint  Patient presents with  . Fall  . Weakness   History of Present Illness:  Lee Mercado is a 87 year male with medical history significant for atrial fibrillation, hypertension, dyslipidemia, vertigo to the ER by EMS for evaluation of weakness.    Patient fell 2 days prior to his admission to the hospital.  At baseline he has a motorized scooter but is able to transfer.  Patient states that his legs gave out while walking to the bathroom and he fell.  He denied feeling dizzy or lightheaded during that episode and denied hitting his head. His son notes that he has had increased weakness over the weekend and is having difficulty with transfers. Patient has had diarrhea and has a cough productive of clear phlegm.  He was noted to have diffuse wheezing in the emergency room and room air pulse oximetry of 84% requiring oxygen supplementation at 3 L to maintain pulse oximetry greater than 92%. Wheezing improved with breathing treatments but not completely resolved.  He denies having any vomiting. He denies having any abdominal pain, no constipation, no nausea, no headache, no fever, no chills, no shortness of breath, no chest pain, no diaphoresis, no palpitations, no sore throat.   CT scan of the chest without contrast shows low inspiratory volumes with mild bibasilar atelectasis. Diffuse bilateral lower lobe bronchial wall thickening is favored to be chronic in nature. Acute on chronic bronchitis is difficult to exclude by imaging alone. 8 mm pulmonary nodule in the right lung apex. Non-contrast chest CT at 6-12 months is recommended. If the nodule is stable at time of repeat CT, then future CT at 18-24 months (from today's scan) is considered optional for low-risk patients, but is recommended for high-risk patients. Aortic  atherosclerosis with a 2.0cm x 1.3cm penetrating atherosclerotic ulceration at the aortic isthmus. T5 and T9 compression fractures are favored to be chronic in nature.   Vascular surgery was consulted by Dr. Francine Graven for atherosclerotic ulceration.   Current Facility-Administered Medications  Medication Dose Route Frequency Provider Last Rate Last Admin  . 0.9 %  sodium chloride infusion   Intravenous Continuous Agbata, Tochukwu, MD 100 mL/hr at 01/01/21 1159 New Bag at 01/01/21 1159  . acetaminophen (TYLENOL) tablet 500 mg  500 mg Oral Q6H PRN Agbata, Tochukwu, MD      . apixaban (ELIQUIS) tablet 5 mg  5 mg Oral BID Agbata, Tochukwu, MD      . aspirin EC tablet 81 mg  81 mg Oral Q6H PRN Agbata, Tochukwu, MD      . budesonide (PULMICORT) nebulizer solution 2 mg  2 mg Nebulization BID Agbata, Tochukwu, MD   2 mg at 01/01/21 1112  . cholecalciferol (VITAMIN D3) tablet 1,000 Units  1,000 Units Oral Daily Agbata, Tochukwu, MD   1,000 Units at 01/01/21 1120  . diclofenac Sodium (VOLTAREN) 1 % topical gel 2 g  2 g Topical QID PRN Agbata, Tochukwu, MD      . guaiFENesin (ROBITUSSIN) 100 MG/5ML solution 100 mg  5 mL Oral Q4H PRN Agbata, Tochukwu, MD   100 mg at 01/01/21 1153  . hydroxyurea (HYDREA) capsule 500 mg  500 mg Oral Daily Agbata, Tochukwu, MD   500 mg at 01/01/21 1153  . ipratropium-albuterol (DUONEB) 0.5-2.5 (3) MG/3ML nebulizer solution 3 mL  3 mL Nebulization Q6H PRN  Collier Bullock, MD      . Derrill Memo ON 01/02/2021] levothyroxine (SYNTHROID) tablet 25 mcg  25 mcg Oral QAC breakfast Agbata, Tochukwu, MD      . meclizine (ANTIVERT) tablet 25 mg  25 mg Oral Daily Agbata, Tochukwu, MD   25 mg at 01/01/21 1153  . metoprolol tartrate (LOPRESSOR) tablet 12.5 mg  12.5 mg Oral BID Agbata, Tochukwu, MD   12.5 mg at 01/01/21 1120  . multivitamin with minerals tablet 1 tablet  1 tablet Oral Daily Agbata, Tochukwu, MD   1 tablet at 01/01/21 1120  . ondansetron (ZOFRAN) tablet 4 mg  4 mg Oral Q6H PRN Agbata,  Tochukwu, MD       Or  . ondansetron (ZOFRAN) injection 4 mg  4 mg Intravenous Q6H PRN Agbata, Tochukwu, MD      . pravastatin (PRAVACHOL) tablet 20 mg  20 mg Oral Daily Agbata, Tochukwu, MD   20 mg at 01/01/21 1121  . vitamin B-12 (CYANOCOBALAMIN) tablet 1,000 mcg  1,000 mcg Oral Daily Agbata, Tochukwu, MD   1,000 mcg at 01/01/21 1120   Past Medical History:  Diagnosis Date  . A-fib ([place])   . Cancer ([place])    skin cancers  . Coagulation defect ([place])   . Finger injury   . History of heat stroke   . Hyperlipidemia   . Hypertension   . Previous back surgery   . Stroke ([place])   . Vertigo    Past Surgical History:  Procedure Laterality Date  . BACK SURGERY  2003  . FINGER DEBRIDEMENT  1963   Left hand 4 digit   . REPLACEMENT TOTAL KNEE Right 2006   Social History Social History   Tobacco Use  . Smoking status: Never Smoker  . Smokeless tobacco: Never Used  . Tobacco comment: smoking cessation materials not required  Vaping Use  . Vaping Use: Never used  Substance Use Topics  . Alcohol use: No  . Drug use: No   Family History Family History  Problem Relation Age of Onset  . Cancer Mother        breast  . Emphysema Father   . Diabetes Sister   . Paget's disease of bone Brother   . Cancer Sister        lung cancer  . Healthy Sister   Denies family history of peripheral artery disease, venous disease or renal disease.  No Known Allergies  REVIEW OF SYSTEMS (Negative unless checked)  Constitutional: [] Weight loss  [] Fever  [] Chills Cardiac: [] Chest pain   [] Chest pressure   [] Palpitations   [] Shortness of breath when laying flat   [] Shortness of breath at rest   [] Shortness of breath with exertion. Vascular:  [] Pain in legs with walking   [] Pain in legs at rest   [] Pain in legs when laying flat   [] Claudication   [] Pain in feet when walking  [] Pain in feet at rest  [] Pain in feet when laying flat   [] History of DVT   [] Phlebitis   [x] Swelling in legs   [] Varicose veins    [] Non-healing ulcers Pulmonary:   [] Uses home oxygen   [] Productive cough   [] Hemoptysis   [] Wheeze  [] COPD   [] Asthma Neurologic:  [] Dizziness  [] Blackouts   [] Seizures   [] History of stroke   [] History of TIA  [] Aphasia   [] Temporary blindness   [] Dysphagia   [] Weakness or numbness in arms   [x] Weakness or numbness in legs Musculoskeletal:  [] Arthritis   [] Joint swelling   []   Joint pain   [] Low back pain Hematologic:  [] Easy bruising  [] Easy bleeding   [] Hypercoagulable state   [x] Anemic  [] Hepatitis Gastrointestinal:  [] Blood in stool   [] Vomiting blood  [] Gastroesophageal reflux/heartburn   [] Difficulty swallowing. Genitourinary:  [] Chronic kidney disease   [] Difficult urination  [] Frequent urination  [] Burning with urination   [] Blood in urine Skin:  [] Rashes   [] Ulcers   [] Wounds Psychological:  [] History of anxiety   []  History of major depression.  Physical Examination  Vitals:   01/01/21 0930 01/01/21 1036 01/01/21 1112 01/01/21 1500  BP: 127/62 (!) 138/52  (!) 130/58  Pulse: 84 89  91  Resp: 16 17  17   Temp: (!) 97.4 F (36.3 C)   97.8 F (36.6 C)  TempSrc: Oral   Oral  SpO2: 95%  100% 100%  Weight:      Height:       Body mass index is 26.44 kg/m. Gen:  Lethargic, NAD Head: [place]/AT, No temporalis wasting. Prominent temp pulse not noted. Ear/Nose/Throat: Hearing grossly intact, nares w/o erythema or drainage, oropharynx w/o Erythema/Exudate Eyes: Sclera non-icteric, conjunctiva clear Neck: Trachea midline.  No JVD.  Pulmonary:  Good air movement, respirations not labored, equal bilaterally.  Cardiac: Irregularly irregular Vascular:  Vessel Right Left  Radial Palpable Palpable  Ulnar Palpable Palpable  Brachial Palpable Palpable  Carotid Palpable, without bruit Palpable, without bruit  Aorta Not palpable N/A  Femoral Palpable Palpable  Popliteal Non-Palpable Non-Palpable  PT Non-Palpable Non-Palpable  DP Non-Palpable Non-Palpable   Unable to palpate pedal pulses  bilaterally is a moderate edema  Gastrointestinal: soft, non-tender/non-distended. No guarding/reflex.  Musculoskeletal: M/S 5/5 throughout.  Extremities without ischemic changes.  No deformity or atrophy.  Neurologic: Sensation grossly intact in extremities.  Symmetrical.  Speech is fluent. Motor exam as listed above. Psychiatric: Judgment intact, Mood & affect appropriate for pt's clinical situation. Dermatologic: No rashes or ulcers noted.  No cellulitis or open wounds. Lymph : No Cervical, Axillary, or Inguinal lymphadenopathy.  CBC Lab Results  Component Value Date   WBC 9.1 01/01/2021   HGB 10.8 (L) 01/01/2021   HCT 31.3 (L) 01/01/2021   MCV 134.9 (H) 01/01/2021   PLT 407 (H) 01/01/2021   BMET    Component Value Date/Time   NA 135 01/01/2021 0754   NA 142 04/21/2018 1143   K 4.4 01/01/2021 0754   CL 104 01/01/2021 0754   CO2 22 01/01/2021 0754   GLUCOSE 113 (H) 01/01/2021 0754   BUN 40 (H) 01/01/2021 0754   BUN 25 04/21/2018 1143   CREATININE 1.28 (H) 01/01/2021 0754   CALCIUM 9.1 01/01/2021 0754   GFRNONAA 52 (L) 01/01/2021 0754   GFRAA 48 (L) 04/21/2018 1143   Estimated Creatinine Clearance: 38.4 mL/min (A) (by C-G formula based on SCr of 1.28 mg/dL (H)).  COAG Lab Results  Component Value Date   INR 1.33 11/11/2017   Radiology DG Chest 1 View  Result Date: 01/01/2021 CLINICAL DATA:  87 year old male status post fall EXAM: CHEST  1 VIEW COMPARISON:  None. FINDINGS: Very low inspiratory volumes. Cardiac and mediastinal contours are within normal limits. Mild interstitial prominence and bronchial wall thickening are favored to be chronic in nature. Gaseous distension of the colon and small bowel. Left basilar patchy airspace opacity is nonspecific. No acute osseous abnormality. IMPRESSION: 1. Very low inspiratory volumes with probable left basilar atelectasis versus scarring. 2. Probable chronic bronchitic changes and interstitial prominence. 3. Mild gaseous  distension of colon and small bowel  in the visualized upper abdomen. 4. No acute fracture visualized. Electronically Signed   By: Jacqulynn Cadet M.D.   On: 01/01/2021 07:48   CT Head Wo Contrast  Result Date: 01/01/2021 CLINICAL DATA:  Recent falls EXAM: CT HEAD WITHOUT CONTRAST TECHNIQUE: Contiguous axial images were obtained from the base of the skull through the vertex without intravenous contrast. COMPARISON:  Head CT November 11, 2017 and brain MRI November 12, 2017 FINDINGS: Brain: Mild diffuse atrophy is stable. There is no intracranial mass, hemorrhage, extra-axial fluid collection, or midline shift. 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 There is small vessel disease in the centra semiovale bilaterally, stable. No acute appearing infarct is evident on this study. Vascular: No hyperdense vessel. There is calcification in each carotid siphon region. Skull: Bony calvarium appears intact. Sinuses/Orbits: There is a small retention cyst in mid left ethmoid region. Other visualized paranasal sinuses are clear. Apparent prior cataract removal on the left. Orbits otherwise appear symmetric bilaterally. Other: Visualized mastoid air cells are clear. 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 No acute infarct evident. No mass or hemorrhage. There are foci of arterial vascular calcification. Small retention cyst in a mid left ethmoid air cell. Electronically Signed   By: Lowella Grip III M.D.   On: 01/01/2021 08:58   CT Chest Wo Contrast  Result Date: 01/01/2021 CLINICAL DATA:  Pneumonia, pleural effusion or abscess suspected. EXAM: CT CHEST WITHOUT CONTRAST TECHNIQUE: Multidetector CT imaging of the chest was performed following the standard protocol without IV contrast. COMPARISON:  Chest x-ray obtained earlier today FINDINGS: Cardiovascular: Limited evaluation in the  absence of intravenous contrast. No evidence of aneurysm. Scattered heterogeneous atherosclerotic plaque. Small penetrating atherosclerotic ulceration in the region of the ligamentum arteriosum. The maximal aortic diameter at this location is 3.7 cm which is nonaneurysmal. Calcifications present throughout the coronary arteries. The heart is normal in size. No pericardial effusion. Mediastinum/Nodes: Unremarkable CT appearance of the thyroid gland. No suspicious mediastinal or hilar adenopathy. No soft tissue mediastinal mass. The thoracic esophagus is unremarkable. Lungs/Pleura: Nonspecific 0.8 mm pulmonary nodule in the right lung apex which may represent a focus of pleuroparenchymal scarring. Diffuse mild bronchial wall thickening. Minimal dependent atelectasis in the lower lungs. No focal infiltrate, pleural effusion or pneumothorax. Upper Abdomen: No acute abnormality. Musculoskeletal: T5 compression fracture with approximately 50% height loss appears chronic. No focal lucency. Compression deformity of the inferior endplate of T9 with less than 20% height loss also appears chronic in nature. No acute osseous abnormality. IMPRESSION: 1. Low inspiratory volumes with mild bibasilar atelectasis. 2. Diffuse bilateral lower lobe bronchial wall thickening is favored to be chronic in nature. Acute on chronic bronchitis is difficult to exclude by imaging alone. 3. 8 mm pulmonary nodule in the right lung apex. Non-contrast chest CT at 6-12 months is recommended. If the nodule is stable at time of repeat CT, then future CT at 18-24 months (from today's scan) is considered optional for low-risk patients, but is recommended for high-risk patients. This recommendation follows the consensus statement: Guidelines for Management of Incidental Pulmonary Nodules Detected on CT Images: From the Fleischner Society 2017; Radiology 2017; 284:228-243. 4. Aortic atherosclerosis with a 2.0 x 1.3 cm penetrating atherosclerotic ulceration  at the aortic isthmus. Aortic Atherosclerosis (ICD10-I70.0). 5. T5 and T9 compression fractures are favored to be chronic in nature. Electronically Signed   By: Jacqulynn Cadet M.D.   On: 01/01/2021 08:58  Assessment/Plan Lee Mercado is a 87 year male with medical history significant for atrial fibrillation, hypertension, dyslipidemia, vertigo to the ER by EMS for evaluation of weakness with incidental finding of atherosclerotic ulceration at the aortic isthmus.  1. Penetrating Atherosclerotic Ulceration at The Aorta: Reviewed images with Dr. Lucky Cowboy. The lesion is small, measuring 2.0cm x 1.3cm on the final radiology read and nonobstructing. There is no indication for vascular intervention. Would like to continue to surveilled in the outpatient setting. We will plan on repeating the CT in approximately 6 months.  Plan was discussed with the son who is at the bedside. He is in agreement.  2.  Hyperlipidemia: On aspirin and statin for medical management Encouraged good control as its slows the progression of atherosclerotic disease  3.  Prediabetes: Diet controlled Encouraged good control as its slows the progression of atherosclerotic disease  Discussed with Dr. Mayme Genta, PA-C  01/01/2021 4:11 PM  This note was created with Dragon medical transcription system.  Any error is purely unintentional.

## 2021-01-01 NOTE — ED Notes (Signed)
EDP made aware that patient's RA sat 84%, pt placed on 3L via [place] at this time. Pt's O2 sat up to 94% on 3L

## 2021-01-01 NOTE — ED Provider Notes (Signed)
[facility] Emergency Department Provider Note   ____________________________________________    I have reviewed the triage vital signs and the nursing notes.   HISTORY  Chief Complaint Fall and Weakness     HPI Lee Mercado is a 87 y.o. male with history as noted below who presents for weakness.  Son reports over the last several days patient has been extremely weak, typically uses a "buggy "to get around but is able to stand and transfer individually.  This is changed over the last several days, patient unable to stand on his own.  Did have a fall 2 days ago without significant injury reported.  Denies head injury.  Denies neuro deficits.  No nausea vomiting or abdominal pain.  No chest pain or shortness of breath.  No fevers reported.  Past Medical History:  Diagnosis Date  . A-fib ([place])   . Cancer ([place])    skin cancers  . Coagulation defect ([place])   . Finger injury   . History of heat stroke   . Hyperlipidemia   . Hypertension   . Previous back surgery   . Stroke ([place])   . Vertigo     Patient Active Problem List   Diagnosis Date Noted  . Acute respiratory failure ([place]) 01/01/2021  . Hypothyroidism 11/21/2017  . Vertigo 11/20/2017  . Macrocytic anemia 11/20/2017  . Gait instability 11/20/2017  . Prediabetes 11/20/2017  . Do not resuscitate 11/12/2017  . TIA (transient ischemic attack) 11/11/2017  . B12 deficiency 09/04/2017  . CKD (chronic kidney disease) stage 3, GFR 30-59 ml/min (HCC) 09/04/2017  . Carpal tunnel syndrome 08/07/2017  . Thrombocytosis 08/06/2017  . Hyperlipidemia 08/06/2017  . Atrial fibrillation ([place]) 08/06/2017    Past Surgical History:  Procedure Laterality Date  . BACK SURGERY  2003  . FINGER DEBRIDEMENT  1963   Left hand 4 digit   . REPLACEMENT TOTAL KNEE Right 2006    Prior to Admission medications   Medication Sig Start Date End Date Taking? Authorizing Provider  acetaminophen (TYLENOL) 500 MG tablet  Take 500 mg by mouth every 6 (six) hours as needed.    [provider]  aspirin EC 81 MG tablet Take 81 mg by mouth every 6 (six) hours as needed (Takes when dizzy and it helps to clear this up.).     [provider]  Cholecalciferol (VITAMIN D) 2000 units CAPS Take 1 capsule (2,000 Units total) by mouth daily. 12/18/17   Plonk, Gwyndolyn Saxon, MD  cyanocobalamin 1000 MCG tablet Take 1 tablet (1,000 mcg total) by mouth daily. 03/24/18   Juline Patch, MD  diclofenac sodium (VOLTAREN) 1 % GEL Apply topically 4 (four) times daily as needed.    [provider]  ELIQUIS 5 MG TABS tablet TAKE 1 TABLET BY MOUTH TWICE A DAY 11/20/18   Juline Patch, MD  hydroxyurea (HYDREA) 500 MG capsule Take 1 capsule (500 mg total) by mouth daily. May take with food to minimize GI side effects. 03/24/18   Juline Patch, MD  levothyroxine (SYNTHROID, LEVOTHROID) 25 MCG tablet TAKE 1 TABLET BY MOUTH DAILY BEFORE BREAKFAST 09/28/18   Juline Patch, MD  meclizine (ANTIVERT) 25 MG tablet Take 25 mg by mouth daily.     [provider]  metoprolol tartrate (LOPRESSOR) 25 MG tablet Take 0.5 tablets (12.5 mg total) by mouth 2 (two) times daily. 03/24/18   Juline Patch, MD  Multiple Vitamin (MULTIVITAMIN) tablet Take 1 tablet daily by mouth.  [provider]  pravastatin (PRAVACHOL) 20 MG tablet TAKE 1 TABLET BY MOUTH DAILY 12/18/18   Juline Patch, MD     Allergies Patient has no known allergies.  Family History  Problem Relation Age of Onset  . Cancer Mother        breast  . Emphysema Father   . Diabetes Sister   . Paget's disease of bone Brother   . Cancer Sister        lung cancer  . Healthy Sister     Social History Social History   Tobacco Use  . Smoking status: Never Smoker  . Smokeless tobacco: Never Used  . Tobacco comment: smoking cessation materials not required  Vaping Use  . Vaping Use: Never used  Substance Use Topics  . Alcohol use: No  . Drug  use: No    Review of Systems  Constitutional: No fever/chills Eyes: No visual changes.  ENT: No sore throat. Cardiovascular: Denies chest pain. Respiratory: Denies shortness of breath. Gastrointestinal: No abdominal pain.  No nausea, no vomiting.   Genitourinary: Negative for dysuria. Musculoskeletal: Negative for back pain. Skin: Skin tears Neurological: Negative for headaches or weakness   ____________________________________________   PHYSICAL EXAM:  VITAL SIGNS: ED Triage Vitals  Enc Vitals Group     BP 01/01/21 0625 125/73     Pulse Rate 01/01/21 0625 74     Resp 01/01/21 0625 18     Temp 01/01/21 0623 (!) 97.5 F (36.4 C)     Temp Source 01/01/21 0623 Oral     SpO2 01/01/21 0622 94 %     Weight 01/01/21 0622 86 kg (189 lb 9.5 oz)     Height 01/01/21 0622 1.803 m (5\' 11" )     Head Circumference --      Peak Flow --      Pain Score 01/01/21 0622 0     Pain Loc --      Pain Edu? --      Excl. in House? --     Constitutional: Alert and oriented. No acute distress. Pleasant and interactive Eyes: Conjunctivae are normal.  Head: Atraumatic. Nose: No congestion/rhinnorhea. Mouth/Throat: Mucous membranes are moist.   Neck:  Painless ROM Cardiovascular: Normal rate, regular rhythm.  Good peripheral circulation. Respiratory: Normal respiratory effort.  No retractions.  Scattered wheezes Gastrointestinal: Soft and nontender. No distention.  Reassuring exam  Musculoskeletal: No lower extremity tenderness nor edema.  Warm and well perfused Neurologic:  Normal speech and language. No gross focal neurologic deficits are appreciated.  Skin:  Skin is warm, dry Psychiatric: Mood and affect are normal. Speech and behavior are normal.  ____________________________________________   LABS (all labs ordered are listed, but only abnormal results are displayed)  Labs Reviewed  CBC - Abnormal; Notable for the following components:      Result Value   RBC 2.32 (*)     Hemoglobin 10.8 (*)    HCT 31.3 (*)    MCV 134.9 (*)    MCH 46.6 (*)    Platelets 407 (*)    All other components within normal limits  COMPREHENSIVE METABOLIC PANEL - Abnormal; Notable for the following components:   Glucose, Bld 113 (*)    BUN 40 (*)    Creatinine, Ser 1.28 (*)    GFR, Estimated 52 (*)    All other components within normal limits  RESP PANEL BY RT-PCR (FLU A&B, COVID) ARPGX2  CULTURE, BLOOD (ROUTINE X 2)  CULTURE, BLOOD (ROUTINE X  2)  CK  URINALYSIS, COMPLETE (UACMP) WITH MICROSCOPIC  LACTIC ACID, PLASMA  LACTIC ACID, PLASMA   ____________________________________________  EKG  ED ECG REPORT I, Lavonia Drafts, the attending physician, personally viewed and interpreted this ECG.  Date: 01/01/2021  Rhythm: normal sinus rhythm QRS Axis: normal Intervals: Right bundle branch block ST/T Wave abnormalities: normal Narrative Interpretation: no evidence of acute ischemia  ____________________________________________  RADIOLOGY  Chest x-ray with questionable infiltrate ____________________________________________   PROCEDURES  Procedure(s) performed: No  Procedures   Critical Care performed: yes  CRITICAL CARE Performed by: Lavonia Drafts   Total critical care time:30 minutes  Critical care time was exclusive of separately billable procedures and treating other patients.  Critical care was necessary to treat or prevent imminent or life-threatening deterioration.  Critical care was time spent personally by me on the following activities: development of treatment plan with patient and/or surrogate as well as nursing, discussions with consultants, evaluation of patient's response to treatment, examination of patient, obtaining history from patient or surrogate, ordering and performing treatments and interventions, ordering and review of laboratory studies, ordering and review of radiographic studies, pulse oximetry and re-evaluation of patient's  condition.  ____________________________________________   INITIAL IMPRESSION / ASSESSMENT AND PLAN / ED COURSE  Pertinent labs & imaging results that were available during my care of the patient were reviewed by me and considered in my medical decision making (see chart for details).  Patient presents with generalized weakness, requiring 2-3 nurses to stand him for transfer to wheelchair.  This is apparently very typical for him.  We will obtain labs, x-rays, urinalysis to search for possible cause.  Blood pressure is reassuring, heart rate normal, afebrile.  Differential includes electrolyte abnormalities, dehydration, infection.  Reports of traumatic injury however the patient does have a history of atrial fibrillation, Eliquis reported on medication list.  Nurse reports the patient became hypoxic after transferring to the restroom, 3 L nasal cannula applied with good response.  Suspect this is related to his wheezing.  DuoNeb given, Solu-Medrol ordered  Chest x-ray with questionable infiltrate, white blood cell count normal.  No abdominal tenderness to exam  CT scan negative for pneumonia, suspect bronchitis with bronchospasm causing his hypoxia and likely is weakness.  Blood cultures and lactic pending  Discussed with Dr. Francine Graven for admission    ____________________________________________   FINAL CLINICAL IMPRESSION(S) / ED DIAGNOSES  Final diagnoses:  Bronchitis  Acute respiratory failure with hypoxia [facility])        Note:  This document was prepared using Dragon voice recognition software and may include unintentional dictation errors.   Lavonia Drafts, MD 01/01/21 [phone]

## 2021-01-01 NOTE — ED Notes (Signed)
Pt assisted to bathroom by this RN, Margreta Journey RN, and EK, Radio producer. Pt noted to be unsteady on his feet, c/o feeling dizzy with standing. Pt noted to be weak with standing. Max assist with standing at this time. Pt noted to urinate and have diarrhea at this time. Pt back to bed at this time, clean brief/gown/sock/linen provided by this RN. EDP updated son on plan of care while patient in the bathroom.

## 2021-01-01 NOTE — ED Triage Notes (Signed)
Emergency Medicine Provider Triage Evaluation Note  Lee Mercado , a 87 y.o. male  was evaluated in triage.  Pt complains of fall at home.  States he tripped going to the bathroom, then slipped and fell again while trying to get up.  He has multiple skin tears that appear to be old.  He has no complaints at this time.  Review of Systems  Positive: Fall at home with history of frequent falls. Negative: Headache, neck pain, chest pain, shortness of breath, nausea, vomiting, abdominal pain, and any pain in his arms or legs.  Physical Exam  BP 125/73 (BP Location: Left Arm)   Pulse 74   Temp (!) 97.5 F (36.4 C) (Oral)   Resp 18   Ht 1.803 m (5\' 11" )   Wt 86 kg   SpO2 95%   BMI 26.44 kg/m  Gen:   Awake, no distress   HEENT:  Atraumatic  Resp:  Normal effort  Cardiac:  Normal rate  Abd:   Nondistended, nontender  MSK:   Moves extremities without difficulty.  Multiple old skin tears at various ages and degrees of healing. Neuro:  Speech clear.  No focal neurological deficits appreciated.  Medical Decision Making  Medically screening exam initiated at 6:37 AM.  Appropriate orders placed.  Lee Mercado was informed that the remainder of the evaluation will be completed by another provider, this initial triage assessment does not replace that evaluation, and the importance of remaining in the ED until their evaluation is complete.  Clinical Impression  Mechanical fall at home.  No specific complaints.  ED RN is cleaning his skin tear(s) and applying bacitracin ointment for the subacute lesions.  In the absence of any acute pain, I will defer imaging or additional work-up to the daytime provider.   Hinda Kehr, MD 01/01/21 [phone]

## 2021-01-01 NOTE — ED Notes (Signed)
Pt returned from CT, 2 IV initiated and blood cultures obtained by this RN. Pt tolerated well. Pt continues to wheeze at this time. Breathing treatment given by this RN and EK, Radio producer.

## 2021-01-01 NOTE — TOC Initial Note (Signed)
Transition of Care [facility]) - Initial/Assessment Note    Patient Details  Name: Lee Mercado MRN: [id number] Date of Birth: 1925-01-07  Transition of Care [facility]) CM/SW Contact:    Pete Pelt, RN Phone Number: 01/01/2021, 4:11 PM  Clinical Narrative:  TOC spoke with son re: discharge planning.  Patient lives in independent living with spouse, who has health conditions.  Son and daughter help periodically, but daughter has health issues as well.  Son inquired about patient going to short term rehab for strengthening, as he feels his father is afraid of falling and needs him to be stronger.  Son also inquired about patient going to [place] with plans for spouse to join him at a later date.  TOC stated recommendations will be reviewed and a guarantee of placement cannot be made, just depends on beds, insurance, ability for facility to accept, etc.  Son understanding of this and states he will be in tomorrow to discuss further once he speaks to his sister.  TOC contact information given, TOC will follow through discharge.                 Expected Discharge Plan:  (TBD) Barriers to Discharge: Continued Medical Work up   Patient Goals and CMS Choice     Choice offered to / list presented to : NA  Expected Discharge Plan and Services Expected Discharge Plan:  (TBD)   Discharge Planning Services:  (TBD) Post Acute Care Choice:  (TBD) Living arrangements for the past 2 months: [place]                 DME Arranged:  (TBD)         HH Arranged:  (TBD)          Prior Living Arrangements/Services Living arrangements for the past 2 months: [place] Lives with:: Self,Spouse Patient language and need for interpreter reviewed:: Yes Do you feel safe going back to the place where you live?: Yes      Need for Family Participation in Patient Care: Yes (Comment) Care giver support system in place?: Yes (comment)   Criminal  Activity/Legal Involvement Pertinent to Current Situation/Hospitalization: No - Comment as needed  Activities of Daily Living      Permission Sought/Granted   Permission granted to share information with : Yes, Verbal Permission Granted     Permission granted to share info w AGENCY: Facility for rehab TBD        Emotional Assessment Appearance:: Appears stated age Attitude/Demeanor/Rapport: Gracious Affect (typically observed): Pleasant   Alcohol / Substance Use: Not Applicable Psych Involvement: No (comment)  Admission diagnosis:  Acute respiratory failure ([place]) [J96.00] Bronchitis [J40] DVT (deep venous thrombosis) (HCC) [I82.409] Acute respiratory failure with hypoxia ([place]) [J96.01] Patient Active Problem List   Diagnosis Date Noted  . Acute respiratory failure ([place]) 01/01/2021  . Bronchitis 01/01/2021  . Hypothyroidism 11/21/2017  . Vertigo 11/20/2017  . Macrocytic anemia 11/20/2017  . Gait instability 11/20/2017  . Prediabetes 11/20/2017  . Do not resuscitate 11/12/2017  . TIA (transient ischemic attack) 11/11/2017  . B12 deficiency 09/04/2017  . CKD (chronic kidney disease) stage 3, GFR 30-59 ml/min (HCC) 09/04/2017  . Carpal tunnel syndrome 08/07/2017  . Thrombocytosis 08/06/2017  . Hyperlipidemia 08/06/2017  . Atrial fibrillation ([place]) 08/06/2017   PCP:  Danae Orleans, MD Pharmacy:   [place], [place] - [address] [place] [address] Phone: [phone] Fax: [phone]  Social Determinants of Health (SDOH) Interventions    Readmission Risk Interventions No flowsheet data found.

## 2021-01-01 NOTE — Progress Notes (Signed)
Call received from lab for critical lactic acid of 2.6. Dr. Francine Graven notified immediately. Orders received.

## 2021-01-01 NOTE — H&P (Signed)
History and Physical    Lee Mercado WER:[id number] DOB: 12/23/1924 DOA: 01/01/2021  PCP: Danae Orleans, MD   Patient coming from: Home  I have personally briefly reviewed patient's old medical records in [place]  Chief Complaint: Weakness History was obtained from patient's Lee Mercado over the phone.  HPI: Lee Mercado is a 87 y.o. male with medical history significant for atrial fibrillation, hypertension, dyslipidemia, vertigo to the ER by EMS for evaluation of weakness.  Patient fell 2 days prior to his admission to the hospital.  At baseline he has a motorized scooter but is able to transfer.  Patient states that his legs gave out while walking to the bathroom and he fell.  He denied feeling dizzy or lightheaded during that episode and denied hitting his head. His son notes that he has had increased weakness over the weekend and is having difficulty with transfers. Patient has had diarrhea and has a cough productive of clear phlegm.  He was noted to have diffuse wheezing in the emergency room and room air pulse oximetry of 84% requiring oxygen supplementation at 3 L to maintain pulse oximetry greater than 92%.  Wheezing improved with breathing treatments but not completely resolved. He denies having any vomiting.  He denies having any abdominal pain, no constipation, no nausea, no headache, no fever, no chills, no shortness of breath, no chest pain, no diaphoresis, no palpitations, no sore throat. Labs show sodium 135, potassium 4.4, chloride 104, bicarb 22, glucose 113, BUN 40, creatinine 1.28, calcium 9.1, alkaline phosphatase 76, albumin 3.6, AST 29, ALT 16, total protein 7.1, total CK 134, lactic acid 1.3, white count 9.1, hemoglobin 10.8, hematocrit 31.3, MCV 134.9, RDW 13.1, platelet count 407 Respiratory viral panel is negative Chest x-ray reviewed by me shows  very low inspiratory volumes with probable left basilar atelectasis versus scarring. Probable chronic bronchitic  changes and interstitial prominence. Mild gaseous distension of colon and small bowel in the visualized upper abdomen. CT scan of the chest without contrast shows low inspiratory volumes with mild bibasilar atelectasis. Diffuse bilateral lower lobe bronchial wall thickening is favored to be chronic in nature. Acute on chronic bronchitis is difficult to exclude by imaging alone. 8 mm pulmonary nodule in the right lung apex. Non-contrast chest CT at 6-12 months is recommended. If the nodule is stable at time of repeat CT, then future CT at 18-24 months (from today's scan) is considered optional for low-risk patients, but is recommended for high-risk patients.  Aortic atherosclerosis with a 2.0 x 1.3 cm penetrating atherosclerotic ulceration at the aortic isthmus. Aortic Atherosclerosis  T5 and T9 compression fractures are favored to be chronic in nature. CT scan of the head without contrast showed stable atrophy with periventricular small vessel disease. 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 No acute infarct evident. No mass or hemorrhage. There are foci of arterial vascular calcification. Small retention cyst in a mid left ethmoid air cell. Twelve-lead EKG reviewed by me shows sinus rhythm with a short PR interval and right bundle branch block.   ED Course: Patient is a 87 year old Caucasian male who was brought into the ER by EMS for evaluation of weakness and a fall at home.  Patient was noted to be hypoxic with room air pulse oximetry of 84% that improved following oxygen supplementation at 2 L.  He also had diffuse wheezes.  Imaging shows acute on chronic bronchitis.  He was admitted to the hospital for further evaluation.  Review of  Systems: As per HPI otherwise all other systems reviewed and negative.    Past Medical History:  Diagnosis Date  . A-fib ([place])   . Cancer ([place])    skin cancers  . Coagulation defect ([place])   . Finger injury   . History of  heat stroke   . Hyperlipidemia   . Hypertension   . Previous back surgery   . Stroke ([place])   . Vertigo     Past Surgical History:  Procedure Laterality Date  . BACK SURGERY  2003  . FINGER DEBRIDEMENT  1963   Left hand 4 digit   . REPLACEMENT TOTAL KNEE Right 2006     reports that he has never smoked. He has never used smokeless tobacco. He reports that he does not drink alcohol and does not use drugs.  No Known Allergies  Family History  Problem Relation Age of Onset  . Cancer Mother        breast  . Emphysema Father   . Diabetes Sister   . Paget's disease of bone Brother   . Cancer Sister        lung cancer  . Healthy Sister       Prior to Admission medications   Medication Sig Start Date End Date Taking? Authorizing Provider  acetaminophen (TYLENOL) 500 MG tablet Take 500 mg by mouth every 6 (six) hours as needed.    [provider]  aspirin EC 81 MG tablet Take 81 mg by mouth every 6 (six) hours as needed (Takes when dizzy and it helps to clear this up.).     [provider]  Cholecalciferol (VITAMIN D) 2000 units CAPS Take 1 capsule (2,000 Units total) by mouth daily. 12/18/17   Plonk, Gwyndolyn Saxon, MD  cyanocobalamin 1000 MCG tablet Take 1 tablet (1,000 mcg total) by mouth daily. 03/24/18   Juline Patch, MD  diclofenac sodium (VOLTAREN) 1 % GEL Apply topically 4 (four) times daily as needed.    [provider]  ELIQUIS 5 MG TABS tablet TAKE 1 TABLET BY MOUTH TWICE A DAY 11/20/18   Juline Patch, MD  hydroxyurea (HYDREA) 500 MG capsule Take 1 capsule (500 mg total) by mouth daily. May take with food to minimize GI side effects. 03/24/18   Juline Patch, MD  levothyroxine (SYNTHROID, LEVOTHROID) 25 MCG tablet TAKE 1 TABLET BY MOUTH DAILY BEFORE BREAKFAST 09/28/18   Juline Patch, MD  meclizine (ANTIVERT) 25 MG tablet Take 25 mg by mouth daily.     [provider]  metoprolol tartrate (LOPRESSOR) 25 MG tablet Take 0.5 tablets (12.5 mg  total) by mouth 2 (two) times daily. 03/24/18   Juline Patch, MD  Multiple Vitamin (MULTIVITAMIN) tablet Take 1 tablet daily by mouth.    [provider]  pravastatin (PRAVACHOL) 20 MG tablet TAKE 1 TABLET BY MOUTH DAILY 12/18/18   Juline Patch, MD    Physical Exam: Vitals:   01/01/21 0807 01/01/21 0812 01/01/21 0921 01/01/21 0930  BP:  (!) 144/72 127/83 127/62  Pulse: 75 72 81 84  Resp: 18 16 15 16   Temp:    (!) 97.4 F (36.3 C)  TempSrc:    Oral  SpO2: (!) 84% 94% 95% 95%  Weight:      Height:         Vitals:   01/01/21 0807 01/01/21 0812 01/01/21 0921 01/01/21 0930  BP:  (!) 144/72 127/83 127/62  Pulse: 75 72 81 84  Resp: 18  16 15 16   Temp:    (!) 97.4 F (36.3 C)  TempSrc:    Oral  SpO2: (!) 84% 94% 95% 95%  Weight:      Height:          Constitutional: Alert and oriented x 2 person and place. Not in any apparent distress HEENT:      Head: Normocephalic and atraumatic.         Eyes: PERLA, EOMI, Conjunctivae are normal. Sclera is non-icteric.       Mouth/Throat: Mucous membranes are moist.       Neck: Supple with no signs of meningismus. Cardiovascular: Regular rate and rhythm. No murmurs, gallops, or rubs. 2+ symmetrical distal pulses are present . No JVD. 3+ LE edema Respiratory: Respiratory effort normal .  Bilateral air entry.  Scattered wheezes, no crackles, or rhonchi.  Gastrointestinal: Soft, non tender, and non distended with positive bowel sounds.  Genitourinary: No CVA tenderness. Musculoskeletal: Nontender with normal range of motion in all extremities. No cyanosis, or erythema of extremities. Neurologic:  Face is symmetric. Moving all extremities. No gross focal neurologic deficits.  Generalized weakness Skin: Skin is warm, dry.  No rash or ulcers Psychiatric: Mood and affect are normal    Labs on Admission: I have personally reviewed following labs and imaging studies  CBC: Recent Labs  Lab 01/01/21 0754  WBC 9.1  HGB 10.8*  HCT  31.3*  MCV 134.9*  PLT 300*   Basic Metabolic Panel: Recent Labs  Lab 01/01/21 0754  NA 135  K 4.4  CL 104  CO2 22  GLUCOSE 113*  BUN 40*  CREATININE 1.28*  CALCIUM 9.1   GFR: Estimated Creatinine Clearance: 38.4 mL/min (A) (by C-G formula based on SCr of 1.28 mg/dL (H)). Liver Function Tests: Recent Labs  Lab 01/01/21 0754  AST 29  ALT 16  ALKPHOS 76  BILITOT 0.8  PROT 7.1  ALBUMIN 3.6   No results for input(s): LIPASE, AMYLASE in the last 168 hours. No results for input(s): AMMONIA in the last 168 hours. Coagulation Profile: No results for input(s): INR, PROTIME in the last 168 hours. Cardiac Enzymes: Recent Labs  Lab 01/01/21 0754  CKTOTAL 134   BNP (last 3 results) No results for input(s): PROBNP in the last 8760 hours. HbA1C: No results for input(s): HGBA1C in the last 72 hours. CBG: No results for input(s): GLUCAP in the last 168 hours. Lipid Profile: No results for input(s): CHOL, HDL, LDLCALC, TRIG, CHOLHDL, LDLDIRECT in the last 72 hours. Thyroid Function Tests: No results for input(s): TSH, T4TOTAL, FREET4, T3FREE, THYROIDAB in the last 72 hours. Anemia Panel: No results for input(s): VITAMINB12, FOLATE, FERRITIN, TIBC, IRON, RETICCTPCT in the last 72 hours. Urine analysis:    Component Value Date/Time   COLORURINE YELLOW (A) 11/12/2017 0601   APPEARANCEUR CLEAR (A) 11/12/2017 0601   LABSPEC 1.006 11/12/2017 0601   PHURINE 7.0 11/12/2017 0601   GLUCOSEU NEGATIVE 11/12/2017 0601   HGBUR NEGATIVE 11/12/2017 0601   BILIRUBINUR NEGATIVE 11/12/2017 0601   KETONESUR NEGATIVE 11/12/2017 0601   PROTEINUR NEGATIVE 11/12/2017 0601   NITRITE NEGATIVE 11/12/2017 0601   LEUKOCYTESUR NEGATIVE 11/12/2017 0601    Radiological Exams on Admission: DG Chest 1 View  Result Date: 01/01/2021 CLINICAL DATA:  87 year old male status post fall EXAM: CHEST  1 VIEW COMPARISON:  None. FINDINGS: Very low inspiratory volumes. Cardiac and mediastinal contours are  within normal limits. Mild interstitial prominence and bronchial wall thickening are favored to be chronic  in nature. Gaseous distension of the colon and small bowel. Left basilar patchy airspace opacity is nonspecific. No acute osseous abnormality. IMPRESSION: 1. Very low inspiratory volumes with probable left basilar atelectasis versus scarring. 2. Probable chronic bronchitic changes and interstitial prominence. 3. Mild gaseous distension of colon and small bowel in the visualized upper abdomen. 4. No acute fracture visualized. Electronically Signed   By: Jacqulynn Cadet M.D.   On: 01/01/2021 07:48   CT Head Wo Contrast  Result Date: 01/01/2021 CLINICAL DATA:  Recent falls EXAM: CT HEAD WITHOUT CONTRAST TECHNIQUE: Contiguous axial images were obtained from the base of the skull through the vertex without intravenous contrast. COMPARISON:  Head CT November 11, 2017 and brain MRI November 12, 2017 FINDINGS: Brain: Mild diffuse atrophy is stable. There is no intracranial mass, hemorrhage, extra-axial fluid collection, or midline shift. 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 There is small vessel disease in the centra semiovale bilaterally, stable. No acute appearing infarct is evident on this study. Vascular: No hyperdense vessel. There is calcification in each carotid siphon region. Skull: Bony calvarium appears intact. Sinuses/Orbits: There is a small retention cyst in mid left ethmoid region. Other visualized paranasal sinuses are clear. Apparent prior cataract removal on the left. Orbits otherwise appear symmetric bilaterally. Other: Visualized mastoid air cells are clear. 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 No acute infarct evident. No mass or hemorrhage. There are foci of arterial vascular calcification. Small retention cyst in a mid left ethmoid air cell.  Electronically Signed   By: Lowella Grip III M.D.   On: 01/01/2021 08:58   CT Chest Wo Contrast  Result Date: 01/01/2021 CLINICAL DATA:  Pneumonia, pleural effusion or abscess suspected. EXAM: CT CHEST WITHOUT CONTRAST TECHNIQUE: Multidetector CT imaging of the chest was performed following the standard protocol without IV contrast. COMPARISON:  Chest x-ray obtained earlier today FINDINGS: Cardiovascular: Limited evaluation in the absence of intravenous contrast. No evidence of aneurysm. Scattered heterogeneous atherosclerotic plaque. Small penetrating atherosclerotic ulceration in the region of the ligamentum arteriosum. The maximal aortic diameter at this location is 3.7 cm which is nonaneurysmal. Calcifications present throughout the coronary arteries. The heart is normal in size. No pericardial effusion. Mediastinum/Nodes: Unremarkable CT appearance of the thyroid gland. No suspicious mediastinal or hilar adenopathy. No soft tissue mediastinal mass. The thoracic esophagus is unremarkable. Lungs/Pleura: Nonspecific 0.8 mm pulmonary nodule in the right lung apex which may represent a focus of pleuroparenchymal scarring. Diffuse mild bronchial wall thickening. Minimal dependent atelectasis in the lower lungs. No focal infiltrate, pleural effusion or pneumothorax. Upper Abdomen: No acute abnormality. Musculoskeletal: T5 compression fracture with approximately 50% height loss appears chronic. No focal lucency. Compression deformity of the inferior endplate of T9 with less than 20% height loss also appears chronic in nature. No acute osseous abnormality. IMPRESSION: 1. Low inspiratory volumes with mild bibasilar atelectasis. 2. Diffuse bilateral lower lobe bronchial wall thickening is favored to be chronic in nature. Acute on chronic bronchitis is difficult to exclude by imaging alone. 3. 8 mm pulmonary nodule in the right lung apex. Non-contrast chest CT at 6-12 months is recommended. If the nodule is stable  at time of repeat CT, then future CT at 18-24 months (from today's scan) is considered optional for low-risk patients, but is recommended for high-risk patients. This recommendation follows the consensus statement: Guidelines for Management of Incidental Pulmonary Nodules Detected on CT Images: From the Fleischner  Society 2017; Radiology 2017; 284:228-243. 4. Aortic atherosclerosis with a 2.0 x 1.3 cm penetrating atherosclerotic ulceration at the aortic isthmus. Aortic Atherosclerosis (ICD10-I70.0). 5. T5 and T9 compression fractures are favored to be chronic in nature. Electronically Signed   By: Jacqulynn Cadet M.D.   On: 01/01/2021 08:58     Assessment/Plan Principal Problem:   Acute respiratory failure (HCC) Active Problems:   Atrial fibrillation (HCC)   CKD (chronic kidney disease) stage 3, GFR 30-59 ml/min (HCC)   Vertigo   Hypothyroidism   Bronchitis    Acute respiratory failure Probably secondary to acute on chronic bronchitis Patient had a pulse oximetry of 84% and is currently on 3 L of oxygen with improvement in his pulse oximetry to greater than 92% We will attempt to wean patient off oxygen as tolerated once his acute bronchitis has improved   Acute on chronic bronchitis Place patient on as needed bronchodilator therapy as well as inhaled steroids Add antitussives as well    History of paroxysmal atrial fibrillation Continue metoprolol for rate control Continue apixaban as primary prophylaxis for an acute stroke   Vertigo Continue meclizine    Weakness Status post fall We will place patient on fall precautions PT evaluate and treat    Aortic atherosclerosis Noted on imaging with penetrating atherosclerotic ulceration at the aortic isthmus. Will request vascular surgery consult   Stage III chronic kidney disease Renal function is stable   DVT prophylaxis: Apixaban Code Status: full code Family Communication: Greater than 50% of time was spent  discussing patient's condition and plan of care with his son and healthcare power of attorney Daymon Hora.  All questions and concerns have been addressed.  He verbalizes understanding and agrees with the plan.  CODE STATUS was discussed and he wants his father to be full code. Disposition Plan: Back to previous home environment Consults called: Vascular surgery, physical therapy Status: At the time of admission, it appears that the appropriate admission status for this patient is inpatient.   This is judged to be reasonable and necessary in order to provide the required intensity of service to ensure the patient's safety given the presenting symptoms, physical exam findings, and initial radiographic and laboratory data in the context of their comorbid conditions. Patient requires inpatient status due to high intensity of service, high risk for further deterioration and high frequency of surveillance required.    Collier Bullock MD [facility]     01/01/2021, 10:36 AM

## 2021-01-01 NOTE — ED Notes (Signed)
Pt taken to CT at this time.

## 2021-01-01 NOTE — ED Triage Notes (Signed)
EMS brought in from home for fall when walking to the bathroom. Reports "legs giving out", denies head injury. No blood thinner use. Currently being treated for a dental infection with penicillin. AAOx4. No obvious injury. No complaint.

## 2021-01-01 NOTE — Progress Notes (Signed)
OT Cancellation Note  Patient Details Name: Lee Mercado MRN: [id number] DOB: 1924/11/24   Cancelled Treatment:    Reason Eval/Treat Not Completed: Medical issues which prohibited therapy. Consult received, chart reviewed. Pt noted with pending vascular surgery consult for "atherosclerotic ulceration at the aortic isthmus." Will hold OT evaluation and re-attempt at later date/time pending consult and any resulting plan of care.   Hanley Hays, MPH, MS, OTR/L ascom [phone] 01/01/21, 12:32 PM

## 2021-01-01 NOTE — Progress Notes (Signed)
Initial Nutrition Assessment  DOCUMENTATION CODES:   Not applicable  INTERVENTION:   -MVI with minerals daily -Magic cup TID with meals, each supplement provides 290 kcal and 9 grams of protein -Liberalize diet to regular   NUTRITION DIAGNOSIS:   Increased nutrient needs related to chronic illness (bronchitis) as evidenced by estimated needs.  GOAL:   Patient will meet greater than or equal to 90% of their needs  MONITOR:   PO intake,Supplement acceptance,Labs,Weight trends,Skin,I & O's  REASON FOR ASSESSMENT:   Consult Assessment of nutrition requirement/status  ASSESSMENT:   Lee Mercado is a 87 y.o. male with medical history significant for atrial fibrillation, hypertension, dyslipidemia, vertigo to the ER by EMS for evaluation of weakness.  Patient fell 2 days prior to his admission to the hospital.  At baseline he has a motorized scooter but is able to transfer.  Patient states that his legs gave out while walking to the bathroom and he fell.  He denied feeling dizzy or lightheaded during that episode and denied hitting his head.  Pt admitted with acute respiratory failure.   Pt sleeping soundly at time of visit. He did not arouse to voice or touch.   No meal completion records available at this time.   Reviewed wt hx; wt has been stable over the past year.   Medications reviewed and include vitamin D3, vitamin B-12, and 0.9% sodium chloride infusion @ 100 ml/hr.   Labs reviewed.   NUTRITION - FOCUSED PHYSICAL EXAM:  Flowsheet Row Most Recent Value  Orbital Region No depletion  Upper Arm Region No depletion  Thoracic and Lumbar Region No depletion  Buccal Region No depletion  Temple Region No depletion  Clavicle Bone Region No depletion  Clavicle and Acromion Bone Region No depletion  Scapular Bone Region No depletion  Dorsal Hand No depletion  Patellar Region No depletion  Anterior Thigh Region No depletion  Posterior Calf Region No depletion  Edema  (RD Assessment) None  Hair Reviewed  Eyes Reviewed  Mouth Reviewed  Skin Reviewed  Nails Reviewed       Diet Order:   Diet Order            Diet 2 gram sodium Room service appropriate? Yes; Fluid consistency: Thin  Diet effective now                 EDUCATION NEEDS:   No education needs have been identified at this time  Skin:  Skin Assessment: Reviewed RN Assessment  Last BM:  Unknown  Height:   Ht Readings from Last 1 Encounters:  01/01/21 5\' 11"  (1.803 m)    Weight:   Wt Readings from Last 1 Encounters:  01/01/21 86 kg    Ideal Body Weight:  78.2 kg  BMI:  Body mass index is 26.44 kg/m.  Estimated Nutritional Needs:   Kcal:  2150-2350  Protein:  105-120 grams  Fluid:  > 2 L    Loistine Chance, RD, LDN, [place] Registered Dietitian II Certified Diabetes Care and Education Specialist Please refer to [facility] for RD and/or RD on-call/weekend/after hours pager

## 2021-01-02 DIAGNOSIS — D473 Essential (hemorrhagic) thrombocythemia: Secondary | ICD-10-CM

## 2021-01-02 DIAGNOSIS — J209 Acute bronchitis, unspecified: Secondary | ICD-10-CM

## 2021-01-02 DIAGNOSIS — E039 Hypothyroidism, unspecified: Secondary | ICD-10-CM

## 2021-01-02 DIAGNOSIS — E785 Hyperlipidemia, unspecified: Secondary | ICD-10-CM

## 2021-01-02 DIAGNOSIS — I480 Paroxysmal atrial fibrillation: Secondary | ICD-10-CM

## 2021-01-02 DIAGNOSIS — R600 Localized edema: Secondary | ICD-10-CM

## 2021-01-02 LAB — BASIC METABOLIC PANEL
Anion gap: 8 (ref 5–15)
BUN: 39 mg/dL — ABNORMAL HIGH (ref 8–23)
CO2: 24 mmol/L (ref 22–32)
Calcium: 9.3 mg/dL (ref 8.9–10.3)
Chloride: 107 mmol/L (ref 98–111)
Creatinine, Ser: 1.2 mg/dL (ref 0.61–1.24)
GFR, Estimated: 56 mL/min — ABNORMAL LOW (ref >60)
Glucose, Bld: 114 mg/dL — ABNORMAL HIGH (ref 70–99)
Potassium: 3.8 mmol/L (ref 3.5–5.1)
Sodium: 139 mmol/L (ref 135–145)

## 2021-01-02 LAB — CBC
HCT: 29.6 % — ABNORMAL LOW (ref 39.0–52.0)
Hemoglobin: 10.7 g/dL — ABNORMAL LOW (ref 13.0–17.0)
MCH: 46.9 pg — ABNORMAL HIGH (ref 26.0–34.0)
MCHC: 36.1 g/dL — ABNORMAL HIGH (ref 30.0–36.0)
MCV: 129.8 fL — ABNORMAL HIGH (ref 80.0–100.0)
Platelets: 421 10*3/uL — ABNORMAL HIGH (ref 150–400)
RBC: 2.28 MIL/uL — ABNORMAL LOW (ref 4.22–5.81)
RDW: 12.9 % (ref 11.5–15.5)
WBC: 9.9 10*3/uL (ref 4.0–10.5)
nRBC: 0 % (ref 0.0–0.2)

## 2021-01-02 MED ORDER — AZITHROMYCIN 500 MG PO TABS
500.0000 mg | ORAL_TABLET | Freq: Every day | ORAL | Status: AC
  Administered 2021-01-02: 500 mg via ORAL
  Filled 2021-01-02: qty 1

## 2021-01-02 MED ORDER — PREDNISONE 20 MG PO TABS
30.0000 mg | ORAL_TABLET | Freq: Every day | ORAL | Status: DC
  Administered 2021-01-02 – 2021-01-04 (×3): 30 mg via ORAL
  Filled 2021-01-02 (×3): qty 1

## 2021-01-02 MED ORDER — AZITHROMYCIN 500 MG PO TABS
250.0000 mg | ORAL_TABLET | Freq: Every day | ORAL | Status: DC
  Administered 2021-01-03 – 2021-01-04 (×2): 250 mg via ORAL
  Filled 2021-01-02 (×2): qty 1

## 2021-01-02 NOTE — Evaluation (Signed)
Physical Therapy Evaluation Patient Details Name: Lee Mercado MRN: [id number] DOB: 23-Mar-1925 Today's Date: 01/02/2021   History of Present Illness  Lee Mercado is a 87 y.o. male with medical history significant for atrial fibrillation, hypertension, dyslipidemia, vertigo to the ER by EMS for evaluation of weakness.  Patient fell 2 days prior to his admission to the hospital.  At baseline he has a motorized scooter but is able to transfer.  Patient states that his legs gave out while walking to the bathroom and he fell.  He denied feeling dizzy or lightheaded during that episode and denied hitting his head.  Clinical Impression  Patient received in bed, reports he needs to be cleaned up. Patient's son present in room. Patient is aware of falling and his weakness, but not oriented to date, place. STM deficits. Patient requires mod assist for bed mobility, transfers with mod +2 assist. Increased time and effort needed. Patient is able to take a few shuffling steps from bed to recliner with mod +2 assist and RW. Patient will continue to benefit from skilled PT while here to improve strength and functional independence.          Follow Up Recommendations SNF    Equipment Recommendations  None recommended by PT;Other (comment)    Recommendations for Other Services       Precautions / Restrictions Precautions Precautions: Fall Restrictions Weight Bearing Restrictions: No      Mobility  Bed Mobility Overal bed mobility: Needs Assistance Bed Mobility: Supine to Sit     Supine to sit: Mod assist     General bed mobility comments: Patient requires assist to bring legs off bed. Increased time to perform bed mobility.    Transfers Overall transfer level: Needs assistance Equipment used: Rolling walker (2 wheeled) Transfers: Sit to/from Stand Sit to Stand: Mod assist;+2 physical assistance         General transfer comment: Requires mod assist to power up with increased time  required.  Ambulation/Gait Ambulation/Gait assistance: Mod assist;+2 physical assistance Gait Distance (Feet): 3 Feet   Gait Pattern/deviations: Decreased step length - right;Decreased step length - left;Shuffle;Step-to pattern;Trunk flexed Gait velocity: decr   General Gait Details: mod assist for taking a few steps from bed to recliner. Small, shuffle steps with minimal foot clearance.  Stairs            Wheelchair Mobility    Modified Rankin (Stroke Patients Only)       Balance Overall balance assessment: Needs assistance Sitting-balance support: Feet supported Sitting balance-Leahy Scale: Good     Standing balance support: Bilateral upper extremity supported;During functional activity Standing balance-Leahy Scale: Fair Standing balance comment: Reliant on B UE support and mod assist for safety, moving walker, support                             Pertinent Vitals/Pain Pain Assessment: Faces Faces Pain Scale: Hurts little more Pain Location: B legs/feet due to swelling Pain Descriptors / Indicators: Discomfort;Tightness;Heaviness Pain Intervention(s): Limited activity within patient's tolerance;Monitored during session;Repositioned    Home Living Family/patient expects to be discharged to:: Skilled nursing facility                      Prior Function Level of Independence: Independent with assistive device(s);Needs assistance   Gait / Transfers Assistance Needed: Ambulates with walker/cane  ADL's / Homemaking Assistance Needed: requires some assistance from children, wife  Hand Dominance        Extremity/Trunk Assessment   Upper Extremity Assessment Upper Extremity Assessment: Defer to OT evaluation    Lower Extremity Assessment Lower Extremity Assessment: Generalized weakness    Cervical / Trunk Assessment Cervical / Trunk Assessment: Kyphotic  Communication   Communication: No difficulties  Cognition  Arousal/Alertness: Awake/alert Behavior During Therapy: WFL for tasks assessed/performed                                   General Comments: Patient is confused. Not oriented to place, time      General Comments      Exercises     Assessment/Plan    PT Assessment Patient needs continued PT services  PT Problem List Decreased strength;Decreased mobility;Decreased activity tolerance;Decreased balance;Decreased cognition       PT Treatment Interventions DME instruction;Therapeutic exercise;Gait training;Balance training;Functional mobility training;Therapeutic activities;Patient/family education    PT Goals (Current goals can be found in the Care Plan section)  Acute Rehab PT Goals Patient Stated Goal: patient/son okay with patient going to SNF for rehab to get stronger PT Goal Formulation: With patient/family Time For Goal Achievement: 01/16/21 Potential to Achieve Goals: Good    Frequency Min 2X/week   Barriers to discharge Decreased caregiver support      Co-evaluation PT/OT/SLP Co-Evaluation/Treatment: Yes Reason for Co-Treatment: For patient/therapist safety;To address functional/ADL transfers PT goals addressed during session: Mobility/safety with mobility;Balance         AM-PAC PT "6 Clicks" Mobility  Outcome Measure Help needed turning from your back to your side while in a flat bed without using bedrails?: A Little Help needed moving from lying on your back to sitting on the side of a flat bed without using bedrails?: A Lot Help needed moving to and from a bed to a chair (including a wheelchair)?: A Lot Help needed standing up from a chair using your arms (e.g., wheelchair or bedside chair)?: A Lot Help needed to walk in hospital room?: A Lot Help needed climbing 3-5 steps with a railing? : Total 6 Click Score: 12    End of Session Equipment Utilized During Treatment: Gait belt;Oxygen Activity Tolerance: Patient limited by lethargy Patient  left: in chair;with call bell/phone within reach;with chair alarm set Nurse Communication: Mobility status PT Visit Diagnosis: Unsteadiness on feet (R26.81);Other abnormalities of gait and mobility (R26.89);Muscle weakness (generalized) (M62.81);Difficulty in walking, not elsewhere classified (R26.2);History of falling (Z91.81);Dizziness and giddiness (R42)    Time: 0932-3557 PT Time Calculation (min) (ACUTE ONLY): 34 min   Charges:   PT Evaluation $PT Eval Moderate Complexity: 1 Mod PT Treatments $Therapeutic Activity: 8-22 mins        Gaynor Genco, PT, GCS 01/02/21,11:52 AM

## 2021-01-02 NOTE — Evaluation (Signed)
Occupational Therapy Evaluation Patient Details Name: Lee Mercado MRN: [id number] DOB: [birth year] Today's Date: 01/02/2021    History of Present Illness 87 y.o. male with medical history significant for atrial fibrillation, hypertension, dyslipidemia, vertigo to the ER by EMS for evaluation of weakness.  Patient fell 2 days prior to his admission to the hospital.  At baseline he has a motorized scooter but is able to transfer.  Patient states that his legs gave out while walking to the bathroom and he fell.  He denied feeling dizzy or lightheaded during that episode and denied hitting his head. Pt admitted with acute respiratory failure.   Clinical Impression   Pt seen for OT/PT co-evaluation this date. Upon arrival to room, pt awake in bed with son present. Pt and family agreeable to eval. Prior to admission, pt was living in an assisted living facility with his wife. Pt was using a cane/walker for functional mobility and receiving assistance from personal care assistance (4 hours/day) and children for LB dressing, seated bathing, and IADLS. Pt currently presents with decreased balance, strength, and activity tolerance and requires MOD A for supine>sit transfers, MOD Ax2 for stand pivot transfers, MAX A (+2 for physical assist) for sit>stand peri-care, and SET-UP assist to wash/dry hands white seated in recliner. Pt would benefit from additional OT services to maximize return to PLOF and minimize risk of future falls, injury, caregiver burden, and readmission. Upon discharge, recommend SNF.      Follow Up Recommendations  SNF    Equipment Recommendations  Other (comment) (defer to next venue of care)       Precautions / Restrictions Precautions Precautions: Fall Restrictions Weight Bearing Restrictions: No      Mobility Bed Mobility Overal bed mobility: Needs Assistance Bed Mobility: Supine to Sit     Supine to sit: Mod assist     General bed mobility comments: Patient requires  assist to bring legs off bed. Increased time to perform bed mobility.    Transfers Overall transfer level: Needs assistance Equipment used: Rolling walker (2 wheeled) Transfers: Sit to/from [facility] Sit to Stand: Mod assist;+2 physical assistance Stand pivot transfers: Mod assist;+2 physical assistance       General transfer comment: Requires mod assist to power up with increased time required.    Balance Overall balance assessment: Needs assistance Sitting-balance support: No upper extremity supported;Feet supported Sitting balance-Leahy Scale: Fair Sitting balance - Comments: Fair sitting balance at EOB during UE asssessment   Standing balance support: Bilateral upper extremity supported;During functional activity Standing balance-Leahy Scale: Poor Standing balance comment: Reliant on B UE support and mod assist for safety, moving walker, support                           ADL either performed or assessed with clinical judgement   ADL Overall ADL's : Needs assistance/impaired     Grooming: Wash/dry hands;Set up;Brushing hair;Maximal assistance;Sitting Grooming Details (indicate cue type and reason): MAX A for brushing hair d/t decreased UE ROM/strength                     Toileting- Clothing Manipulation and Hygiene: Maximal assistance;+2 for physical assistance;Sit to/from stand Toileting - Clothing Manipulation Details (indicate cue type and reason): MAX A for posterior peri-care +2 for physical assistance while standing with RW     Functional mobility during ADLs: Moderate assistance;+2 for physical assistance;Rolling walker (stand pivot transfer)  Pertinent Vitals/Pain Pain Assessment: Faces Faces Pain Scale: Hurts little more Pain Location: B legs/feet due to swelling Pain Descriptors / Indicators: Discomfort;Tightness;Heaviness Pain Intervention(s): Limited activity within patient's tolerance;Monitored  during session;Repositioned        Extremity/Trunk Assessment Upper Extremity Assessment Upper Extremity Assessment: Generalized weakness   Lower Extremity Assessment Lower Extremity Assessment: Generalized weakness   Cervical / Trunk Assessment Cervical / Trunk Assessment: Kyphotic   Communication Communication Communication: No difficulties   Cognition Arousal/Alertness: Awake/alert Behavior During Therapy: WFL for tasks assessed/performed Overall Cognitive Status: Impaired/Different from baseline                                 General Comments: Pt oriented to self only. Requires increased processing time and multi-modal cues for sequencing   General Comments               Home Living Family/patient expects to be discharged to:: Skilled nursing facility                                 Additional Comments: Prior to admission, pt was living in an independent living facility with spouse, who has health conditions. Pt's son states that he would like pt  to go to short term rehab following d/c and then move into another [place] that can provide more assistance.      Prior Functioning/Environment Level of Independence: Needs assistance  Gait / Transfers Assistance Needed: Pt ambulates with walker/cane ADL's / Homemaking Assistance Needed: Pt was receiving assist for LB dressing and seated bathing from personal care aide (4 hours/day). Personal care aide and children assist with IADLs            OT Problem List: Decreased strength;Decreased activity tolerance;Impaired balance (sitting and/or standing);Decreased range of motion;Decreased cognition      OT Treatment/Interventions: Self-care/ADL training;Therapeutic exercise;Energy conservation;DME and/or AE instruction;Therapeutic activities;Patient/family education;Balance training    OT Goals(Current goals can be found in the care plan section) Acute Rehab OT Goals Patient  Stated Goal: To get stronger OT Goal Formulation: With patient/family Time For Goal Achievement: 01/16/21 Potential to Achieve Goals: Good ADL Goals Pt Will Perform Upper Body Dressing: with set-up;sitting Pt Will Perform Lower Body Dressing: with mod assist;sit to/from stand Pt Will Transfer to Toilet: with mod assist;stand pivot transfer;bedside commode  OT Frequency: Min 1X/week           Co-evaluation PT/OT/SLP Co-Evaluation/Treatment: Yes Reason for Co-Treatment: For patient/therapist safety;To address functional/ADL transfers PT goals addressed during session: Mobility/safety with mobility;Balance OT goals addressed during session: ADL's and self-care      AM-PAC OT "6 Clicks" Daily Activity     Outcome Measure Help from another person eating meals?: None Help from another person taking care of personal grooming?: A Little Help from another person toileting, which includes using toliet, bedpan, or urinal?: A Lot Help from another person bathing (including washing, rinsing, drying)?: A Lot Help from another person to put on and taking off regular upper body clothing?: A Little Help from another person to put on and taking off regular lower body clothing?: A Lot 6 Click Score: 16   End of Session Equipment Utilized During Treatment: Gait belt;Rolling walker;Oxygen Nurse Communication: Mobility status  Activity Tolerance: Patient tolerated treatment well Patient left: in chair;with call bell/phone within reach;with chair alarm set;Other (comment);with family/visitor present (with MD present)  OT Visit Diagnosis: Unsteadiness on feet (R26.81);Muscle weakness (generalized) (M62.81);History of falling (Z91.81)                Time: 6435-3912 OT Time Calculation (min): 32 min Charges:  OT General Charges $OT Visit: 1 Visit OT Evaluation $OT Eval Moderate Complexity: 1 Mod OT Treatments $Self Care/Home Management : 8-22 mins  Fredirick Maudlin, OTR/L [place]

## 2021-01-02 NOTE — TOC Progression Note (Signed)
Transition of Care [facility]) - Progression Note    Patient Details  Name: Lee Mercado MRN: [id number] Date of Birth: April 09, 1925  [facility] CM/SW [place], RN Phone Number: 01/02/2021, 1:30 PM  Clinical Narrative:   PT recommends SNF, TOC in to see patient and son at bedside discussed discharge planning. Patient alert and oriented during this encounter. All amenable to SNF placement, they have no preferred facility at this time.  SNF workup started.  No further questions from patient or son, TOC contact information given, TOC will follow through discharge.    Expected Discharge Plan:  (TBD) Barriers to Discharge: Continued Medical Work up  Expected Discharge Plan and Services Expected Discharge Plan:  (TBD)   Discharge Planning Services:  (TBD) Post Acute Care Choice:  (TBD) Living arrangements for the past 2 months: [place]                 DME Arranged:  (TBD)         HH Arranged:  (TBD)           Social Determinants of Health (SDOH) Interventions    Readmission Risk Interventions No flowsheet data found.

## 2021-01-02 NOTE — Progress Notes (Signed)
Patient ID: Lee Mercado, male   DOB: September 28, 1925, 87 y.o.   MRN: [id number] [facility] PROGRESS NOTE  Lee Mercado TOI:[id number] DOB: Aug 19, 1925 DOA: 01/01/2021 PCP: Lee Orleans, MD  HPI/Subjective: Patient feeling little bit better today.  Yesterday had a lot of chest congestion.  Had shortness of breath and some cough.  Today feeling a little bit better.  Was hypoxic at home.  Objective: Vitals:   01/02/21 0748 01/02/21 1100  BP:  133/68  Pulse:  68  Resp:  18  Temp:  98 F (36.7 C)  SpO2: 95% 98%    Intake/Output Summary (Last 24 hours) at 01/02/2021 1547 Last data filed at 01/02/2021 0350 Gross per 24 hour  Intake 120 ml  Output 351 ml  Net -231 ml   Filed Weights   01/01/21 0622  Weight: 86 kg    ROS: Review of Systems  Respiratory: Positive for cough and shortness of breath.   Cardiovascular: Negative for chest pain.  Gastrointestinal: Negative for abdominal pain, nausea and vomiting.   Exam: Physical Exam HENT:     Head: Normocephalic.     Mouth/Throat:     Pharynx: No oropharyngeal exudate.  Eyes:     General: Lids are normal.     Conjunctiva/sclera: Conjunctivae normal.     Pupils: Pupils are equal, round, and reactive to light.  Cardiovascular:     Rate and Rhythm: Normal rate and regular rhythm.     Heart sounds: Normal heart sounds, S1 normal and S2 normal.  Pulmonary:     Breath sounds: Examination of the right-lower field reveals decreased breath sounds and wheezing. Examination of the left-lower field reveals decreased breath sounds and wheezing. Decreased breath sounds and wheezing present. No rhonchi or rales.  Abdominal:     Palpations: Abdomen is soft.     Tenderness: There is no abdominal tenderness.  Musculoskeletal:     Right lower leg: Swelling present.     Left lower leg: Swelling present.  Skin:    General: Skin is warm.     Findings: No rash.  Neurological:     Mental Status: He is alert and oriented to person, place, and  time.       Data Reviewed: Basic Metabolic Panel: Recent Labs  Lab 01/01/21 0754 01/02/21 0446  NA 135 139  K 4.4 3.8  CL 104 107  CO2 22 24  GLUCOSE 113* 114*  BUN 40* 39*  CREATININE 1.28* 1.20  CALCIUM 9.1 9.3   Liver Function Tests: Recent Labs  Lab 01/01/21 0754  AST 29  ALT 16  ALKPHOS 76  BILITOT 0.8  PROT 7.1  ALBUMIN 3.6   CBC: Recent Labs  Lab 01/01/21 0754 01/02/21 0446  WBC 9.1 9.9  HGB 10.8* 10.7*  HCT 31.3* 29.6*  MCV 134.9* 129.8*  PLT 407* 421*   Cardiac Enzymes: Recent Labs  Lab 01/01/21 0754  CKTOTAL 134     Recent Results (from the past 240 hour(s))  Resp Panel by RT-PCR (Flu A&B, Covid) Nasopharyngeal Swab     Status: None   Collection Time: 01/01/21  8:44 AM   Specimen: Nasopharyngeal Swab; Nasopharyngeal(NP) swabs in vial transport medium  Result Value Ref Range Status   SARS Coronavirus 2 by RT PCR NEGATIVE NEGATIVE Final    Comment: (NOTE) SARS-CoV-2 target nucleic acids are NOT DETECTED.  The SARS-CoV-2 RNA is generally detectable in upper respiratory specimens during the acute phase of infection. The lowest concentration of SARS-CoV-2 viral copies this assay can  detect is 138 copies/mL. A negative result does not preclude SARS-Cov-2 infection and should not be used as the sole basis for treatment or other patient management decisions. A negative result may occur with  improper specimen collection/handling, submission of specimen other than nasopharyngeal swab, presence of viral mutation(s) within the areas targeted by this assay, and inadequate number of viral copies(<138 copies/mL). A negative result must be combined with clinical observations, patient history, and epidemiological information. The expected result is Negative.  Fact Sheet for Patients:  EntrepreneurPulse.com.au  Fact Sheet for Healthcare Providers:  IncredibleEmployment.be  This test is no t yet approved or  cleared by the [place] FDA and  has been authorized for detection and/or diagnosis of SARS-CoV-2 by FDA under an Emergency Use Authorization (EUA). This EUA will remain  in effect (meaning this test can be used) for the duration of the COVID-19 declaration under Section 564(b)(1) of the Act, 21 U.S.C.section 360bbb-3(b)(1), unless the authorization is terminated  or revoked sooner.       Influenza A by PCR NEGATIVE NEGATIVE Final   Influenza B by PCR NEGATIVE NEGATIVE Final    Comment: (NOTE) The Xpert Xpress SARS-CoV-2/FLU/RSV plus assay is intended as an aid in the diagnosis of influenza from Nasopharyngeal swab specimens and should not be used as a sole basis for treatment. Nasal washings and aspirates are unacceptable for Xpert Xpress SARS-CoV-2/FLU/RSV testing.  Fact Sheet for Patients: EntrepreneurPulse.com.au  Fact Sheet for Healthcare Providers: IncredibleEmployment.be  This test is not yet approved or cleared by the [place] FDA and has been authorized for detection and/or diagnosis of SARS-CoV-2 by FDA under an Emergency Use Authorization (EUA). This EUA will remain in effect (meaning this test can be used) for the duration of the COVID-19 declaration under Section 564(b)(1) of the Act, 21 U.S.C. section 360bbb-3(b)(1), unless the authorization is terminated or revoked.  Performed at [facility], [address] [place], [place] [address]   Blood culture (routine x 2)     Status: None (Preliminary result)   Collection Time: 01/01/21  8:44 AM   Specimen: BLOOD  Result Value Ref Range Status   Specimen Description BLOOD BLOOD LEFT FOREARM  Final   Special Requests   Final    BOTTLES DRAWN AEROBIC AND ANAEROBIC Blood Culture adequate volume   Culture   Final    NO GROWTH < 24 HOURS Performed at [facility], [address], [place], [place] [address]    Report Status PENDING  Incomplete  Blood  culture (routine x 2)     Status: None (Preliminary result)   Collection Time: 01/01/21  8:44 AM   Specimen: BLOOD  Result Value Ref Range Status   Specimen Description BLOOD LEFT ANTECUBITAL  Final   Special Requests   Final    BOTTLES DRAWN AEROBIC AND ANAEROBIC Blood Culture adequate volume   Culture   Final    NO GROWTH < 24 HOURS Performed at [facility], [address] [place], [place] [address]    Report Status PENDING  Incomplete     Studies: DG Chest 1 View  Result Date: 01/01/2021 CLINICAL DATA:  87 year old male status post fall EXAM: CHEST  1 VIEW COMPARISON:  None. FINDINGS: Very low inspiratory volumes. Cardiac and mediastinal contours are within normal limits. Mild interstitial prominence and bronchial wall thickening are favored to be chronic in nature. Gaseous distension of the colon and small bowel. Left basilar patchy airspace opacity is nonspecific. No acute osseous abnormality. IMPRESSION:  1. Very low inspiratory volumes with probable left basilar atelectasis versus scarring. 2. Probable chronic bronchitic changes and interstitial prominence. 3. Mild gaseous distension of colon and small bowel in the visualized upper abdomen. 4. No acute fracture visualized. Electronically Signed   By: Jacqulynn Cadet M.D.   On: 01/01/2021 07:48   CT Head Wo Contrast  Result Date: 01/01/2021 CLINICAL DATA:  Recent falls EXAM: CT HEAD WITHOUT CONTRAST TECHNIQUE: Contiguous axial images were obtained from the base of the skull through the vertex without intravenous contrast. COMPARISON:  Head CT November 11, 2017 and brain MRI November 12, 2017 FINDINGS: Brain: Mild diffuse atrophy is stable. There is no intracranial mass, hemorrhage, extra-axial fluid collection, or midline shift. 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 There is small vessel disease in the centra semiovale bilaterally, stable. No acute appearing infarct is evident on this study.  Vascular: No hyperdense vessel. There is calcification in each carotid siphon region. Skull: Bony calvarium appears intact. Sinuses/Orbits: There is a small retention cyst in mid left ethmoid region. Other visualized paranasal sinuses are clear. Apparent prior cataract removal on the left. Orbits otherwise appear symmetric bilaterally. Other: Visualized mastoid air cells are clear. 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 No acute infarct evident. No mass or hemorrhage. There are foci of arterial vascular calcification. Small retention cyst in a mid left ethmoid air cell. Electronically Signed   By: Lowella Grip III M.D.   On: 01/01/2021 08:58   CT Chest Wo Contrast  Result Date: 01/01/2021 CLINICAL DATA:  Pneumonia, pleural effusion or abscess suspected. EXAM: CT CHEST WITHOUT CONTRAST TECHNIQUE: Multidetector CT imaging of the chest was performed following the standard protocol without IV contrast. COMPARISON:  Chest x-ray obtained earlier today FINDINGS: Cardiovascular: Limited evaluation in the absence of intravenous contrast. No evidence of aneurysm. Scattered heterogeneous atherosclerotic plaque. Small penetrating atherosclerotic ulceration in the region of the ligamentum arteriosum. The maximal aortic diameter at this location is 3.7 cm which is nonaneurysmal. Calcifications present throughout the coronary arteries. The heart is normal in size. No pericardial effusion. Mediastinum/Nodes: Unremarkable CT appearance of the thyroid gland. No suspicious mediastinal or hilar adenopathy. No soft tissue mediastinal mass. The thoracic esophagus is unremarkable. Lungs/Pleura: Nonspecific 0.8 mm pulmonary nodule in the right lung apex which may represent a focus of pleuroparenchymal scarring. Diffuse mild bronchial wall thickening. Minimal dependent atelectasis in the lower lungs. No focal infiltrate,  pleural effusion or pneumothorax. Upper Abdomen: No acute abnormality. Musculoskeletal: T5 compression fracture with approximately 50% height loss appears chronic. No focal lucency. Compression deformity of the inferior endplate of T9 with less than 20% height loss also appears chronic in nature. No acute osseous abnormality. IMPRESSION: 1. Low inspiratory volumes with mild bibasilar atelectasis. 2. Diffuse bilateral lower lobe bronchial wall thickening is favored to be chronic in nature. Acute on chronic bronchitis is difficult to exclude by imaging alone. 3. 8 mm pulmonary nodule in the right lung apex. Non-contrast chest CT at 6-12 months is recommended. If the nodule is stable at time of repeat CT, then future CT at 18-24 months (from today's scan) is considered optional for low-risk patients, but is recommended for high-risk patients. This recommendation follows the consensus statement: Guidelines for Management of Incidental Pulmonary Nodules Detected on CT Images: From the Fleischner Society 2017; Radiology 2017; 284:228-243. 4. Aortic atherosclerosis with a 2.0 x 1.3 cm penetrating atherosclerotic ulceration at the aortic isthmus. Aortic  Atherosclerosis (ICD10-I70.0). 5. T5 and T9 compression fractures are favored to be chronic in nature. Electronically Signed   By: Jacqulynn Cadet M.D.   On: 01/01/2021 08:58    Scheduled Meds: . apixaban  5 mg Oral BID  . azithromycin  500 mg Oral Daily   Followed by  . [START ON 01/03/2021] azithromycin  250 mg Oral Daily  . budesonide (PULMICORT) nebulizer solution  2 mg Nebulization BID  . cholecalciferol  1,000 Units Oral Daily  . hydroxyurea  500 mg Oral Daily  . levothyroxine  25 mcg Oral QAC breakfast  . meclizine  25 mg Oral Daily  . metoprolol tartrate  12.5 mg Oral BID  . multivitamin with minerals  1 tablet Oral Daily  . pravastatin  20 mg Oral Daily  . predniSONE  30 mg Oral Q breakfast  . cyanocobalamin  1,000 mcg Oral Daily   Brief history.   Patient admitted 01/02/2020 with weakness.  87 year old man with paroxysmal atrial fibrillation, hypertension hyperlipidemia, vertigo, essential thrombocytosis.  Patient had some chest congestion and admitted with these now bronchitis and weakness.  Patient also was hypoxic with a pulse ox of 76% on room air.  CT scan of the chest shows numerous findings including bronchial thickening, pulmonary nodule 8 mm, aortic atherosclerosis with 2.0 x 1.3 penetrating ulceration and T5 and T9 compression fractures.  Assessment/Plan:  1. Acute hypoxic respiratory failure.  Endoscopy patient had a pulse ox of 76% on room air yesterday.  Oxygen supplementation.  Try to taper off oxygen,  pulse ox room air by ambulation but ordered. 2. Acute on chronic bronchitis.  Start Zithromax.  Continue nebulizer treatments.  Patient given a dose of Solu-Medrol in the emergency room I will switch over to prednisone for today. 3. Paroxysmal atrial fibrillation on Eliquis for anticoagulation.  On metoprolol for rate control. 4. Essential thrombocytosis on hydroxyurea 5. Urinary symptoms.  Still awaiting urine analysis.  We will get a bladder scan after urination. 6. Hyperlipidemia unspecified on pravastatin 7. Hypothyroidism unspecified on levothyroxine 8. Vertigo on as needed meclizine 9. Weakness.  Physical therapy recommending rehab 10. Lower extremity edema.  Patient declined water pill at this time.  We see if they can get a pair of TED hose on him. 11. Increased MCV.  Could be secondary to essential thrombocytosis.  Will check folate and vitamin B12 12. Chronic kidney disease stage IIIb 13. 8 mm pulmonary nodule seen on CT scan.  Recommend repeat CT scan in 6 to 12 months 14. Chronic T5 and T9 compression fractures 15. Aortic atherosclerosis with a 2.0x1.3 penetrating atherosclerotic ulceration.  Seen by vascular surgery and no further intervention.    Code Status:     Code Status Orders  (From admission, onward)          Start     Ordered   01/01/21 1032  Full code  Continuous        01/01/21 1032        Code Status History    Date Active Date Inactive Code Status Order ID Comments User Context   11/11/2017 1638 11/13/2017 1508 Full Code [id number]  Dustin Flock, MD ED   Advance Care Planning Activity     Family Communication: Spoke with son at the bedside Disposition Plan: Status is: Inpatient  Dispo: The patient is from: Home              Anticipated d/c is to: Rehab  Patient currently being treated for acute hypoxic respiratory failure with bronchitis.   Difficult to place patient.  Hopefully not  Antibiotics:  Zithromax  Time spent: 29 minutes  [place]

## 2021-01-02 NOTE — NC FL2 (Signed)
[place] LEVEL OF CARE SCREENING TOOL     IDENTIFICATION  Patient Name: Lee Mercado Birthdate: [birth year] Sex: male Admission Date (Current Location): 01/01/2021  [place] and [place] Number:  Engineering geologist and Address:  [facility], [address] [place], [place] [address]      Provider Number: [id number]  Attending Physician Name and Address:  Loletha Grayer, MD  Relative Name and Phone Number:  Josephus, [place])   [phone] [facility])    Current Level of Care: Hospital Recommended Level of Care: [place] Prior Approval Number:    Date Approved/Denied:   PASRR Number: #[id number]  Discharge Plan: SNF    Current Diagnoses: Patient Active Problem List   Diagnosis Date Noted  . Acute respiratory failure ([place]) 01/01/2021  . Bronchitis 01/01/2021  . Hypothyroidism 11/21/2017  . Vertigo 11/20/2017  . Macrocytic anemia 11/20/2017  . Gait instability 11/20/2017  . Prediabetes 11/20/2017  . Do not resuscitate 11/12/2017  . TIA (transient ischemic attack) 11/11/2017  . B12 deficiency 09/04/2017  . CKD (chronic kidney disease) stage 3, GFR 30-59 ml/min (HCC) 09/04/2017  . Carpal tunnel syndrome 08/07/2017  . Thrombocytosis 08/06/2017  . Hyperlipidemia 08/06/2017  . Atrial fibrillation ([place]) 08/06/2017    Orientation RESPIRATION BLADDER Height & Weight     Self,Situation  Normal Incontinent,External catheter Weight: 86 kg Height:  5\' 11"  (180.3 cm)  BEHAVIORAL SYMPTOMS/MOOD NEUROLOGICAL BOWEL NUTRITION STATUS      Incontinent Diet (Regular)  AMBULATORY STATUS COMMUNICATION OF NEEDS Skin   Limited Assist (moderate +2 assist, extra time) Verbally Skin abrasions (Left hand abrasion with guaze dressing)                       Personal Care Assistance Level of Assistance  Bathing,Feeding,Dressing Bathing Assistance: Limited assistance Feeding assistance: Limited assistance Dressing Assistance:  Limited assistance     Functional Limitations Info  Sight,Hearing,Speech Sight Info: Adequate Hearing Info: Adequate Speech Info: Adequate    SPECIAL CARE FACTORS FREQUENCY  PT (By licensed PT),OT (By licensed OT)     PT Frequency: 5x weekly OT Frequency: 5x weekly            Contractures Contractures Info: Not present    Additional Factors Info  Code Status,Allergies Code Status Info: Full Code Allergies Info: No Known Allergies           Current Medications (01/02/2021):  This is the current hospital active medication list Current Facility-Administered Medications  Medication Dose Route Frequency Provider Last Rate Last Admin  . acetaminophen (TYLENOL) tablet 500 mg  500 mg Oral Q6H PRN Agbata, Tochukwu, MD      . apixaban (ELIQUIS) tablet 5 mg  5 mg Oral BID Agbata, Tochukwu, MD   5 mg at 01/02/21 0905  . aspirin EC tablet 81 mg  81 mg Oral Q6H PRN Agbata, Tochukwu, MD      . budesonide (PULMICORT) nebulizer solution 2 mg  2 mg Nebulization BID Agbata, Tochukwu, MD   2 mg at 01/02/21 0748  . cholecalciferol (VITAMIN D3) tablet 1,000 Units  1,000 Units Oral Daily Agbata, Tochukwu, MD   1,000 Units at 01/02/21 0904  . diclofenac Sodium (VOLTAREN) 1 % topical gel 2 g  2 g Topical QID PRN Agbata, Tochukwu, MD      . guaiFENesin (ROBITUSSIN) 100 MG/5ML solution 100 mg  5 mL Oral Q4H PRN Agbata, Tochukwu, MD   100 mg at 01/02/21 1254  . hydroxyurea (HYDREA) capsule  500 mg  500 mg Oral Daily Agbata, Tochukwu, MD   500 mg at 01/02/21 0905  . ipratropium-albuterol (DUONEB) 0.5-2.5 (3) MG/3ML nebulizer solution 3 mL  3 mL Nebulization Q6H PRN Agbata, Tochukwu, MD      . levothyroxine (SYNTHROID) tablet 25 mcg  25 mcg Oral QAC breakfast Agbata, Tochukwu, MD   25 mcg at 01/02/21 0609  . meclizine (ANTIVERT) tablet 25 mg  25 mg Oral Daily Agbata, Tochukwu, MD   25 mg at 01/02/21 0905  . metoprolol tartrate (LOPRESSOR) tablet 12.5 mg  12.5 mg Oral BID Agbata, Tochukwu, MD   12.5 mg at  01/02/21 0904  . multivitamin with minerals tablet 1 tablet  1 tablet Oral Daily Agbata, Tochukwu, MD   1 tablet at 01/02/21 0904  . ondansetron (ZOFRAN) tablet 4 mg  4 mg Oral Q6H PRN Agbata, Tochukwu, MD       Or  . ondansetron (ZOFRAN) injection 4 mg  4 mg Intravenous Q6H PRN Agbata, Tochukwu, MD      . pravastatin (PRAVACHOL) tablet 20 mg  20 mg Oral Daily Agbata, Tochukwu, MD   20 mg at 01/02/21 0904  . predniSONE (DELTASONE) tablet 30 mg  30 mg Oral Q breakfast Loletha Grayer, MD   30 mg at 01/02/21 1157  . vitamin B-12 (CYANOCOBALAMIN) tablet 1,000 mcg  1,000 mcg Oral Daily Agbata, Tochukwu, MD   1,000 mcg at 01/02/21 0906     Discharge Medications: Please see discharge summary for a list of discharge medications.  Relevant Imaging Results:  Relevant Lab Results:   Additional Information SSN [id number]  Pete Pelt, RN

## 2021-01-03 ENCOUNTER — Encounter: Payer: Self-pay | Admitting: Internal Medicine

## 2021-01-03 ENCOUNTER — Other Ambulatory Visit: Payer: Self-pay

## 2021-01-03 LAB — VITAMIN B12: Vitamin B-12: 1689 pg/mL — ABNORMAL HIGH (ref 180–914)

## 2021-01-03 LAB — FOLATE: Folate: 30 ng/mL (ref >5.9)

## 2021-01-03 LAB — SARS CORONAVIRUS 2 (TAT 6-24 HRS): SARS Coronavirus 2: NEGATIVE

## 2021-01-03 LAB — PATHOLOGIST SMEAR REVIEW

## 2021-01-03 MED ORDER — BUDESONIDE 0.5 MG/2ML IN SUSP
0.5000 mg | Freq: Two times a day (BID) | RESPIRATORY_TRACT | Status: DC
  Administered 2021-01-03 – 2021-01-04 (×2): 0.5 mg via RESPIRATORY_TRACT
  Filled 2021-01-03 (×2): qty 2

## 2021-01-03 NOTE — Progress Notes (Signed)
PT Cancellation Note  Patient Details Name: Lee Mercado MRN: [id number] DOB: 1925/09/19   Cancelled Treatment:    Reason Eval/Treat Not Completed: Other (comment) Patient busy with staff currently. Will re-attempt later as time allows.    Danniel Tones 01/03/2021, 11:40 AM

## 2021-01-03 NOTE — TOC Progression Note (Signed)
Transition of Care [facility]) - Progression Note    Patient Details  Name: Lee Mercado MRN: [id number] Date of Birth: March 11, 1925  Transition of Care [facility]) CM/SW Allisonia, RN Phone Number: 01/03/2021, 9:53 AM  Clinical Narrative:   TOC in to see patient.  Son at bedside.  [facility] offered a SNF bed, and patient and son were both amenable to [facility].  Per MD, patient will not be medically ready until tomorrow.  Tammy at [facility] notified, can take patient tomorrow.  Son and patient notified.  No further questions or concerns, TOC will follow through discharge.    Expected Discharge Plan:  (TBD) Barriers to Discharge: Continued Medical Work up  Expected Discharge Plan and Services Expected Discharge Plan:  (TBD)   Discharge Planning Services:  (TBD) Post Acute Care Choice:  (TBD) Living arrangements for the past 2 months: [place]                 DME Arranged:  (TBD)         HH Arranged:  (TBD)           Social Determinants of Health (SDOH) Interventions    Readmission Risk Interventions No flowsheet data found.

## 2021-01-03 NOTE — Progress Notes (Signed)
Physical Therapy Treatment Patient Details Name: Lee Mercado MRN: [id number] DOB: 13-Feb-1925 Today's Date: 01/03/2021    History of Present Illness 87 y.o. male with medical history significant for atrial fibrillation, hypertension, dyslipidemia, vertigo to the ER by EMS for evaluation of weakness.  Patient fell 2 days prior to his admission to the hospital.  At baseline he has a motorized scooter but is able to transfer.  Patient states that his legs gave out while walking to the bathroom and he fell.  He denied feeling dizzy or lightheaded during that episode and denied hitting his head. Pt admitted with acute respiratory failure.    PT Comments    Patient received in bed, he is agreeable to PT session. Reports he has been coughing. Patient requires mod assist for bed mobility and increased time for all mobility with cues. Patient performed sit to stand from elevated bed with min/mod assist. Able to take a few steps from bed to chair with mod assist. Difficulty moving feet, small shuffle steps. Difficulty standing upright. Patient then performed pivot from chair><BSC with mod +1-2 assist. Patient will continue to benefit from skilled PT while here to improve strength and functional independence.       Follow Up Recommendations  SNF     Equipment Recommendations  None recommended by PT;Other (comment)    Recommendations for Other Services       Precautions / Restrictions Precautions Precautions: Fall Restrictions Weight Bearing Restrictions: No    Mobility  Bed Mobility Overal bed mobility: Needs Assistance Bed Mobility: Supine to Sit     Supine to sit: Mod assist     General bed mobility comments: Patient requires assist to bring legs off bed. Requires mod assist for scooting forward to edge of bed. Increased time to perform bed mobility.    Transfers Overall transfer level: Needs assistance Equipment used: Rolling walker (2 wheeled);None Transfers: Sit to/from  [facility] Sit to Stand: Mod assist;Max assist Stand pivot transfers: Max assist       General transfer comment: Mod assist to stand from elevated bed/recliner. Max assist to stand from [facility]. Max assist to pivot with increased time needed for all mobility.  Ambulation/Gait                 Stairs             Wheelchair Mobility    Modified Rankin (Stroke Patients Only)       Balance Overall balance assessment: Needs assistance Sitting-balance support: Feet supported Sitting balance-Leahy Scale: Fair     Standing balance support: Bilateral upper extremity supported;During functional activity Standing balance-Leahy Scale: Poor Standing balance comment: Reliant on B UE support and mod assist for safety, moving walker, support                            Cognition Arousal/Alertness: Awake/alert Behavior During Therapy: WFL for tasks assessed/performed Overall Cognitive Status: Impaired/Different from baseline                                 General Comments: Pt oriented to self only. Requires increased processing time and multi-modal cues for sequencing      Exercises      General Comments        Pertinent Vitals/Pain Pain Assessment: No/denies pain    Home Living  Prior Function            PT Goals (current goals can now be found in the care plan section) Acute Rehab PT Goals Patient Stated Goal: To get stronger PT Goal Formulation: With patient Time For Goal Achievement: 01/16/21 Potential to Achieve Goals: Good Progress towards PT goals: Progressing toward goals    Frequency    Min 2X/week      PT Plan Current plan remains appropriate    Co-evaluation              AM-PAC PT "6 Clicks" Mobility   Outcome Measure  Help needed turning from your back to your side while in a flat bed without using bedrails?: A Lot Help needed moving from lying on your back  to sitting on the side of a flat bed without using bedrails?: A Lot Help needed moving to and from a bed to a chair (including a wheelchair)?: A Lot Help needed standing up from a chair using your arms (e.g., wheelchair or bedside chair)?: A Lot Help needed to walk in hospital room?: Total Help needed climbing 3-5 steps with a railing? : Total 6 Click Score: 10    End of Session Equipment Utilized During Treatment: Gait belt Activity Tolerance: Patient limited by lethargy Patient left: in chair;with call bell/phone within reach Nurse Communication: Mobility status PT Visit Diagnosis: Unsteadiness on feet (R26.81);Other abnormalities of gait and mobility (R26.89);Muscle weakness (generalized) (M62.81);Difficulty in walking, not elsewhere classified (R26.2);History of falling (Z91.81);Dizziness and giddiness (R42)     Time: 1445-1530 PT Time Calculation (min) (ACUTE ONLY): 45 min  Charges:  $Gait Training: 8-22 mins $Therapeutic Activity: 23-37 mins                     Mackinley Kiehn, PT, GCS 01/03/21,3:39 PM

## 2021-01-03 NOTE — Progress Notes (Signed)
Patient has outstanding UA lab, care team has tried to catch UA on several instances but patient will have incontinent episodes in bed where condom caths or male purewicks have failed. Will continue to try and catch UA per order.

## 2021-01-03 NOTE — Progress Notes (Signed)
PROGRESS NOTE    Aleksei Goodlin  MOQ:[id number] DOB: Feb 03, 1925 DOA: 01/01/2021 PCP: Danae Orleans, MD     Brief Narrative:  Seraphim Trow is a 87 year old man with paroxysmal atrial fibrillation, hypertension hyperlipidemia, vertigo, essential thrombocytosis.  Patient had some chest congestion and admitted with bronchitis and weakness.  Patient also was hypoxic with a pulse ox of 76% on room air.  CT scan of the chest shows numerous findings including bronchial thickening, pulmonary nodule 8 mm, aortic atherosclerosis with 2.0 x 1.3 penetrating ulceration and T5 and T9 compression fractures.  Patient was started on antibiotics and required supplemental oxygenation.  New events last 24 hours / Subjective: No new complaints on examination, has intermittent cough, breathing has improved.  Remains on 2 L oxygen.  He states that his lower extremity edema has improved  Assessment & Plan:   Principal Problem:   Acute respiratory failure (HCC) Active Problems:   Essential thrombocytosis (HCC)   Atrial fibrillation (HCC)   CKD (chronic kidney disease) stage 3, GFR 30-59 ml/min (HCC)   Vertigo   Hypothyroidism   Acute bronchitis   Lower extremity edema   Acute hypoxemic respiratory failure -Patient found to have pulse ox of 76% on room air -Remains on 2 L nasal cannula O2, continue to wean as able to tolerate  Acute on chronic bronchitis -CT chest: Diffuse bilateral lower lobe bronchial wall thickening is favored to be chronic in nature. Acute on chronic bronchitis is difficult to exclude by imaging alone. -Continue azithromycin, prednisone  Paroxysmal atrial fibrillation -Continue metoprolol, Eliquis  Essential thrombocytosis -Continue hydroxyurea, aspirin  Hyperlipidemia -Continue pravastatin  Hypothyroidism -Continue Synthroid  Vertigo -Continue meclizine as needed  Incidental finding of 8 mm pulmonary nodule -Repeat CT scan in 6 to 12 months  Chronic T5, T9 compression  fracture -Supportive care  CKD stage IIIa -Stable  Aortic atherosclerosis with a 2.0cm x 1.3cm penetrating atherosclerotic ulceration at the aortic isthmus -Vascular surgery consulted, plan for repeat CT in 6 months    DVT prophylaxis:  Place TED hose Start: 01/02/21 1038 Place and maintain sequential compression device Start: 01/01/21 1002 apixaban (ELIQUIS) tablet 5 mg  Code Status: Full Family Communication: None at bedside  Disposition Plan:  Status is: Inpatient  Remains inpatient appropriate because:Inpatient level of care appropriate due to severity of illness   Dispo: The patient is from: Home              Anticipated d/c is to: SNF              Patient currently is not medically stable to d/c.Continue medical treatment, wean O2. Hopeful dc to SNF 4/7    Difficult to place patient No    Antimicrobials:  Anti-infectives (From admission, onward)   Start     Dose/Rate Route Frequency Ordered Stop   01/03/21 1000  azithromycin (ZITHROMAX) tablet 250 mg       "Followed by" Linked Group Details   250 mg Oral Daily 01/02/21 1546 01/07/21 0959   01/02/21 1645  azithromycin (ZITHROMAX) tablet 500 mg       "Followed by" Linked Group Details   500 mg Oral Daily 01/02/21 1546 01/02/21 1723        Objective: Vitals:   01/02/21 2047 01/02/21 2153 01/03/21 0053 01/03/21 0434  BP: 114/69  (!) 151/61 (!) 154/81  Pulse: 67 75 64 67  Resp: 18  16 18   Temp: 98 F (36.7 C)  98 F (36.7 C) (!) 97.5 F (36.4 C)  TempSrc:      SpO2: 100%  100% 100%  Weight:      Height:       No intake or output data in the 24 hours ending 01/03/21 1030 Filed Weights   01/01/21 0622  Weight: 86 kg    Examination:  General exam: Appears calm and comfortable  Respiratory system: Diminished breath sounds bilaterally, on 2 L oxygen Cardiovascular system: S1 & S2 heard, RRR. No murmurs.  Trace pedal edema2. Gastrointestinal system: Abdomen is nondistended, soft and nontender. Normal  bowel sounds heard. Central nervous system: Alert and oriented. No focal neurological deficits. Speech clear.  Extremities: Symmetric in appearance  Skin: No rashes, lesions or ulcers on exposed skin  Psychiatry: Judgement and insight appear normal. Mood & affect appropriate.   Data Reviewed: I have personally reviewed following labs and imaging studies  CBC: Recent Labs  Lab 01/01/21 0754 01/02/21 0446  WBC 9.1 9.9  HGB 10.8* 10.7*  HCT 31.3* 29.6*  MCV 134.9* 129.8*  PLT 407* 308*   Basic Metabolic Panel: Recent Labs  Lab 01/01/21 0754 01/02/21 0446  NA 135 139  K 4.4 3.8  CL 104 107  CO2 22 24  GLUCOSE 113* 114*  BUN 40* 39*  CREATININE 1.28* 1.20  CALCIUM 9.1 9.3   GFR: Estimated Creatinine Clearance: 41 mL/min (by C-G formula based on SCr of 1.2 mg/dL). Liver Function Tests: Recent Labs  Lab 01/01/21 0754  AST 29  ALT 16  ALKPHOS 76  BILITOT 0.8  PROT 7.1  ALBUMIN 3.6   No results for input(s): LIPASE, AMYLASE in the last 168 hours. No results for input(s): AMMONIA in the last 168 hours. Coagulation Profile: No results for input(s): INR, PROTIME in the last 168 hours. Cardiac Enzymes: Recent Labs  Lab 01/01/21 0754  CKTOTAL 134   BNP (last 3 results) No results for input(s): PROBNP in the last 8760 hours. HbA1C: No results for input(s): HGBA1C in the last 72 hours. CBG: No results for input(s): GLUCAP in the last 168 hours. Lipid Profile: No results for input(s): CHOL, HDL, LDLCALC, TRIG, CHOLHDL, LDLDIRECT in the last 72 hours. Thyroid Function Tests: No results for input(s): TSH, T4TOTAL, FREET4, T3FREE, THYROIDAB in the last 72 hours. Anemia Panel: Recent Labs    01/03/21 0524  FOLATE 30.0   Sepsis Labs: Recent Labs  Lab 01/01/21 0844 01/01/21 1041 01/01/21 1238 01/01/21 1547  LATICACIDVEN 1.3 2.6* 1.6 1.2    Recent Results (from the past 240 hour(s))  Resp Panel by RT-PCR (Flu A&B, Covid) Nasopharyngeal Swab     Status: None    Collection Time: 01/01/21  8:44 AM   Specimen: Nasopharyngeal Swab; Nasopharyngeal(NP) swabs in vial transport medium  Result Value Ref Range Status   SARS Coronavirus 2 by RT PCR NEGATIVE NEGATIVE Final    Comment: (NOTE) SARS-CoV-2 target nucleic acids are NOT DETECTED.  The SARS-CoV-2 RNA is generally detectable in upper respiratory specimens during the acute phase of infection. The lowest concentration of SARS-CoV-2 viral copies this assay can detect is 138 copies/mL. A negative result does not preclude SARS-Cov-2 infection and should not be used as the sole basis for treatment or other patient management decisions. A negative result may occur with  improper specimen collection/handling, submission of specimen other than nasopharyngeal swab, presence of viral mutation(s) within the areas targeted by this assay, and inadequate number of viral copies(<138 copies/mL). A negative result must be combined with clinical observations, patient history, and epidemiological information. The expected result  is Negative.  Fact Sheet for Patients:  EntrepreneurPulse.com.au  Fact Sheet for Healthcare Providers:  IncredibleEmployment.be  This test is no t yet approved or cleared by the [place] FDA and  has been authorized for detection and/or diagnosis of SARS-CoV-2 by FDA under an Emergency Use Authorization (EUA). This EUA will remain  in effect (meaning this test can be used) for the duration of the COVID-19 declaration under Section 564(b)(1) of the Act, 21 U.S.C.section 360bbb-3(b)(1), unless the authorization is terminated  or revoked sooner.       Influenza A by PCR NEGATIVE NEGATIVE Final   Influenza B by PCR NEGATIVE NEGATIVE Final    Comment: (NOTE) The Xpert Xpress SARS-CoV-2/FLU/RSV plus assay is intended as an aid in the diagnosis of influenza from Nasopharyngeal swab specimens and should not be used as a sole basis for treatment.  Nasal washings and aspirates are unacceptable for Xpert Xpress SARS-CoV-2/FLU/RSV testing.  Fact Sheet for Patients: EntrepreneurPulse.com.au  Fact Sheet for Healthcare Providers: IncredibleEmployment.be  This test is not yet approved or cleared by the [place] FDA and has been authorized for detection and/or diagnosis of SARS-CoV-2 by FDA under an Emergency Use Authorization (EUA). This EUA will remain in effect (meaning this test can be used) for the duration of the COVID-19 declaration under Section 564(b)(1) of the Act, 21 U.S.C. section 360bbb-3(b)(1), unless the authorization is terminated or revoked.  Performed at [facility], [address] [place], [place] [address]   Blood culture (routine x 2)     Status: None (Preliminary result)   Collection Time: 01/01/21  8:44 AM   Specimen: BLOOD  Result Value Ref Range Status   Specimen Description BLOOD BLOOD LEFT FOREARM  Final   Special Requests   Final    BOTTLES DRAWN AEROBIC AND ANAEROBIC Blood Culture adequate volume   Culture   Final    NO GROWTH 2 DAYS Performed at [facility], [address], [place], [place] [address]    Report Status PENDING  Incomplete  Blood culture (routine x 2)     Status: None (Preliminary result)   Collection Time: 01/01/21  8:44 AM   Specimen: BLOOD  Result Value Ref Range Status   Specimen Description BLOOD LEFT ANTECUBITAL  Final   Special Requests   Final    BOTTLES DRAWN AEROBIC AND ANAEROBIC Blood Culture adequate volume   Culture   Final    NO GROWTH 2 DAYS Performed at [facility], [address], [place], [place] [address]    Report Status PENDING  Incomplete      Radiology Studies: No results found.    Scheduled Meds: . apixaban  5 mg Oral BID  . azithromycin  250 mg Oral Daily  . budesonide (PULMICORT) nebulizer solution  2 mg Nebulization BID  . cholecalciferol  1,000 Units Oral Daily  .  hydroxyurea  500 mg Oral Daily  . levothyroxine  25 mcg Oral QAC breakfast  . meclizine  25 mg Oral Daily  . metoprolol tartrate  12.5 mg Oral BID  . multivitamin with minerals  1 tablet Oral Daily  . pravastatin  20 mg Oral Daily  . predniSONE  30 mg Oral Q breakfast  . cyanocobalamin  1,000 mcg Oral Daily   Continuous Infusions:   LOS: 2 days      Time spent: 25 minutes   Dessa Phi, DO [facility] 01/03/2021, 10:30 AM   Available via Epic secure chat 7am-7pm After these hours, please  refer to coverage provider listed on [email or web address]

## 2021-01-04 MED ORDER — AZITHROMYCIN 250 MG PO TABS: 250.0000 mg | ORAL_TABLET | Freq: Every day | ORAL | 0 refills | Status: AC

## 2021-01-04 MED ORDER — PREDNISONE 10 MG PO TABS: 30.0000 mg | ORAL_TABLET | Freq: Every day | ORAL | 0 refills | Status: AC

## 2021-01-04 NOTE — Progress Notes (Signed)
Report called to [facility]. Transport scheduled for 12:30

## 2021-01-04 NOTE — Discharge Summary (Signed)
Physician Discharge Summary  Lee Mercado LFY:[id number] DOB: 25-Jun-1925 DOA: 01/01/2021  PCP: Danae Orleans, MD  Admit date: 01/01/2021 Discharge date: 01/04/2021  Admitted From: Home Disposition:  SNF  Recommendations for Outpatient Follow-up:  1. Follow up with PCP in 1 week 2. Incidental finding of 8 mm pulmonary nodule --> Repeat CT scan in 6 to 12 months 3. Aortic atherosclerosis with a 2.0cmx 1.3cm penetrating atherosclerotic ulceration at the aortic isthmus --> Follow up with vascular surgery for repeat CT scan  Discharge Condition: Stable CODE STATUS: Full Diet recommendation:  Diet Orders (From admission, onward)    Start     Ordered   01/04/21 0000  Diet general        01/04/21 0849   01/01/21 1511  Diet regular Room service appropriate? Yes with Assist; Fluid consistency: Thin  Diet effective now       Question Answer Comment  Room service appropriate? Yes with Assist   Fluid consistency: Thin      01/01/21 1510         Brief/Interim Summary: Lee Mercado is a 87 year old man with paroxysmal atrial fibrillation, hypertension hyperlipidemia, vertigo,essential thrombocytosis. Patient had some chest congestion and admitted with bronchitis and weakness. Patient also was hypoxic with a pulse ox of 76% on room air.CT scan of the chest shows numerous findings including bronchial thickening, pulmonary nodule 8 mm, aortic atherosclerosis with 2.0 x 1.3 penetrating ulceration and T5 and T9 compression fractures.  Patient was started on antibiotics and required supplemental oxygenation. Patient continued to improve with his respiratory complaints, was weaned to room air prior to discharge to SNF.   Discharge Diagnoses:  Principal Problem:   Acute respiratory failure (HCC) Active Problems:   Essential thrombocytosis (HCC)   Atrial fibrillation (HCC)   CKD (chronic kidney disease) stage 3, GFR 30-59 ml/min (HCC)   Vertigo   Hypothyroidism   Acute bronchitis   Lower  extremity edema   Acute hypoxemic respiratory failure -Patient found to have pulse ox of 76% on room air -Initially required supplemental O2, now weaned to room air   Acute on chronic bronchitis -CT chest: Diffuse bilateral lower lobe bronchial wall thickening is favored to be chronic in nature. Acute on chronic bronchitis is difficult to exclude by imaging alone. -Continue azithromycin, prednisone 4/5-4/9   Paroxysmal atrial fibrillation -Continue metoprolol, Eliquis  Essential thrombocytosis -Continue hydroxyurea  Hyperlipidemia -Continue pravastatin  Hypothyroidism -Continue Synthroid  Vertigo -Continue meclizine as needed  Incidental finding of 8 mm pulmonary nodule -Repeat CT scan in 6 to 12 months  Chronic T5, T9 compression fracture -Supportive care  CKD stage IIIa -Stable  Aortic atherosclerosis with a 2.0cmx 1.3cm penetrating atherosclerotic ulceration at the aortic isthmus -Vascular surgery consulted, plan for repeat CT in 6 months     Discharge Instructions  Discharge Instructions    Diet general   Complete by: As directed    Discharge instructions   Complete by: As directed    You were cared for by a hospitalist during your hospital stay. If you have any questions about your discharge medications or the care you received while you were in the hospital after you are discharged, you can call the unit and ask to speak with the hospitalist on call if the hospitalist that took care of you is not available. Once you are discharged, your primary care physician will handle any further medical issues. Please note that NO REFILLS for any discharge medications will be authorized once you are discharged, as it is  imperative that you return to your primary care physician (or establish a relationship with a primary care physician if you do not have one) for your aftercare needs so that they can reassess your need for medications and monitor your lab values.    No wound care   Complete by: As directed      Allergies as of 01/04/2021   No Known Allergies     Medication List    STOP taking these medications   amoxicillin 500 MG capsule Commonly known as: AMOXIL   aspirin EC 81 MG tablet     TAKE these medications   acetaminophen 500 MG tablet Commonly known as: TYLENOL Take 500 mg by mouth every 6 (six) hours as needed.   azithromycin 250 MG tablet Commonly known as: ZITHROMAX Take 1 tablet (250 mg total) by mouth daily for 2 days.   cyanocobalamin 1000 MCG tablet Take 1 tablet (1,000 mcg total) by mouth daily. What changed: Another medication with the same name was removed. Continue taking this medication, and follow the directions you see here.   diclofenac sodium 1 % Gel Commonly known as: VOLTAREN Apply topically 4 (four) times daily as needed.   Eliquis 2.5 MG Tabs tablet Generic drug: apixaban Take 2.5 mg by mouth 2 (two) times daily. What changed: Another medication with the same name was removed. Continue taking this medication, and follow the directions you see here.   hydroxyurea 500 MG capsule Commonly known as: HYDREA Take 1 capsule (500 mg total) by mouth daily. May take with food to minimize GI side effects.   levothyroxine 50 MCG tablet Commonly known as: SYNTHROID Take 50 mcg by mouth daily. What changed: Another medication with the same name was removed. Continue taking this medication, and follow the directions you see here.   meclizine 25 MG tablet Commonly known as: ANTIVERT Take 25 mg by mouth 2 (two) times daily.   metoprolol tartrate 25 MG tablet Commonly known as: LOPRESSOR Take 0.5 tablets (12.5 mg total) by mouth 2 (two) times daily.   multivitamin tablet Take 1 tablet daily by mouth.   pravastatin 20 MG tablet Commonly known as: PRAVACHOL TAKE 1 TABLET BY MOUTH DAILY   predniSONE 10 MG tablet Commonly known as: DELTASONE Take 3 tablets (30 mg total) by mouth daily with breakfast for  2 days.   Vitamin D 50 MCG (2000 UT) Caps Take 1 capsule (2,000 Units total) by mouth daily.       Contact information for follow-up providers    Danae Orleans, MD Follow up.   Specialty: Internal Medicine Contact information: [place] [address] [phone]            Contact information for after-discharge care    Destination    [facility] Preferred SNF .   Service: Skilled Nursing Contact information: [address] [place] [phone]                 No Known Allergies  Consultations:  Vascular surgery    Procedures/Studies: DG Chest 1 View  Result Date: 01/01/2021 CLINICAL DATA:  87 year old male status post fall EXAM: CHEST  1 VIEW COMPARISON:  None. FINDINGS: Very low inspiratory volumes. Cardiac and mediastinal contours are within normal limits. Mild interstitial prominence and bronchial wall thickening are favored to be chronic in nature. Gaseous distension of the colon and small bowel. Left basilar patchy airspace opacity is nonspecific. No acute osseous abnormality. IMPRESSION:  1. Very low inspiratory volumes with probable left basilar atelectasis versus scarring. 2. Probable chronic bronchitic changes and interstitial prominence. 3. Mild gaseous distension of colon and small bowel in the visualized upper abdomen. 4. No acute fracture visualized. Electronically Signed   By: Jacqulynn Cadet M.D.   On: 01/01/2021 07:48   CT Head Wo Contrast  Result Date: 01/01/2021 CLINICAL DATA:  Recent falls EXAM: CT HEAD WITHOUT CONTRAST TECHNIQUE: Contiguous axial images were obtained from the base of the skull through the vertex without intravenous contrast. COMPARISON:  Head CT November 11, 2017 and brain MRI November 12, 2017 FINDINGS: Brain: Mild diffuse atrophy is stable. There is no intracranial mass, hemorrhage, extra-axial fluid collection, or midline shift. Scattered  small prior cerebellar infarcts are stable in appearance. There is small vessel disease in the centra semiovale bilaterally, stable. No acute appearing infarct is evident on this study. Vascular: No hyperdense vessel. There is calcification in each carotid siphon region. Skull: Bony calvarium appears intact. Sinuses/Orbits: There is a small retention cyst in mid left ethmoid region. Other visualized paranasal sinuses are clear. Apparent prior cataract removal on the left. Orbits otherwise appear symmetric bilaterally. Other: Visualized mastoid air cells are clear. 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 No acute infarct evident. No mass or hemorrhage. There are foci of arterial vascular calcification. Small retention cyst in a mid left ethmoid air cell. Electronically Signed   By: Lowella Grip III M.D.   On: 01/01/2021 08:58   CT Chest Wo Contrast  Result Date: 01/01/2021 CLINICAL DATA:  Pneumonia, pleural effusion or abscess suspected. EXAM: CT CHEST WITHOUT CONTRAST TECHNIQUE: Multidetector CT imaging of the chest was performed following the standard protocol without IV contrast. COMPARISON:  Chest x-ray obtained earlier today FINDINGS: Cardiovascular: Limited evaluation in the absence of intravenous contrast. No evidence of aneurysm. Scattered heterogeneous atherosclerotic plaque. Small penetrating atherosclerotic ulceration in the region of the ligamentum arteriosum. The maximal aortic diameter at this location is 3.7 cm which is nonaneurysmal. Calcifications present throughout the coronary arteries. The heart is normal in size. No pericardial effusion. Mediastinum/Nodes: Unremarkable CT appearance of the thyroid gland. No suspicious mediastinal or hilar adenopathy. No soft tissue mediastinal mass. The thoracic esophagus is unremarkable. Lungs/Pleura: Nonspecific 0.8 mm pulmonary nodule in the  right lung apex which may represent a focus of pleuroparenchymal scarring. Diffuse mild bronchial wall thickening. Minimal dependent atelectasis in the lower lungs. No focal infiltrate, pleural effusion or pneumothorax. Upper Abdomen: No acute abnormality. Musculoskeletal: T5 compression fracture with approximately 50% height loss appears chronic. No focal lucency. Compression deformity of the inferior endplate of T9 with less than 20% height loss also appears chronic in nature. No acute osseous abnormality. IMPRESSION: 1. Low inspiratory volumes with mild bibasilar atelectasis. 2. Diffuse bilateral lower lobe bronchial wall thickening is favored to be chronic in nature. Acute on chronic bronchitis is difficult to exclude by imaging alone. 3. 8 mm pulmonary nodule in the right lung apex. Non-contrast chest CT at 6-12 months is recommended. If the nodule is stable at time of repeat CT, then future CT at 18-24 months (from today's scan) is considered optional for low-risk patients, but is recommended for high-risk patients. This recommendation follows the consensus statement: Guidelines for Management of Incidental Pulmonary Nodules Detected on CT Images: From the Fleischner Society 2017; Radiology 2017; 284:228-243. 4. Aortic atherosclerosis with a 2.0 x 1.3 cm penetrating atherosclerotic ulceration at the aortic isthmus. Aortic  Atherosclerosis (ICD10-I70.0). 5. T5 and T9 compression fractures are favored to be chronic in nature. Electronically Signed   By: Jacqulynn Cadet M.D.   On: 01/01/2021 08:58      Discharge Exam: Vitals:   01/04/21 0355 01/04/21 0824  BP: (!) 169/82 (!) 173/84  Pulse: 71 61  Resp: 16 18  Temp: 97.9 F (36.6 C) 98.4 F (36.9 C)  SpO2: 94% 95%    General: Pt is alert, awake, not in acute distress Cardiovascular: RRR, S1/S2 +, no edema Respiratory: CTA bilaterally, no wheezing, no rhonchi, no respiratory distress, no conversational dyspnea, on room air  Abdominal: Soft, NT,  ND, bowel sounds + Extremities: no edema, no cyanosis Psych: Normal mood and affect, stable judgement and insight     The results of significant diagnostics from this hospitalization (including imaging, microbiology, ancillary and laboratory) are listed below for reference.     Microbiology: Recent Results (from the past 240 hour(s))  Resp Panel by RT-PCR (Flu A&B, Covid) Nasopharyngeal Swab     Status: None   Collection Time: 01/01/21  8:44 AM   Specimen: Nasopharyngeal Swab; Nasopharyngeal(NP) swabs in vial transport medium  Result Value Ref Range Status   SARS Coronavirus 2 by RT PCR NEGATIVE NEGATIVE Final    Comment: (NOTE) SARS-CoV-2 target nucleic acids are NOT DETECTED.  The SARS-CoV-2 RNA is generally detectable in upper respiratory specimens during the acute phase of infection. The lowest concentration of SARS-CoV-2 viral copies this assay can detect is 138 copies/mL. A negative result does not preclude SARS-Cov-2 infection and should not be used as the sole basis for treatment or other patient management decisions. A negative result may occur with  improper specimen collection/handling, submission of specimen other than nasopharyngeal swab, presence of viral mutation(s) within the areas targeted by this assay, and inadequate number of viral copies(<138 copies/mL). A negative result must be combined with clinical observations, patient history, and epidemiological information. The expected result is Negative.  Fact Sheet for Patients:  EntrepreneurPulse.com.au  Fact Sheet for Healthcare Providers:  IncredibleEmployment.be  This test is no t yet approved or cleared by the [place] FDA and  has been authorized for detection and/or diagnosis of SARS-CoV-2 by FDA under an Emergency Use Authorization (EUA). This EUA will remain  in effect (meaning this test can be used) for the duration of the COVID-19 declaration under Section  564(b)(1) of the Act, 21 U.S.C.section 360bbb-3(b)(1), unless the authorization is terminated  or revoked sooner.       Influenza A by PCR NEGATIVE NEGATIVE Final   Influenza B by PCR NEGATIVE NEGATIVE Final    Comment: (NOTE) The Xpert Xpress SARS-CoV-2/FLU/RSV plus assay is intended as an aid in the diagnosis of influenza from Nasopharyngeal swab specimens and should not be used as a sole basis for treatment. Nasal washings and aspirates are unacceptable for Xpert Xpress SARS-CoV-2/FLU/RSV testing.  Fact Sheet for Patients: EntrepreneurPulse.com.au  Fact Sheet for Healthcare Providers: IncredibleEmployment.be  This test is not yet approved or cleared by the [place] FDA and has been authorized for detection and/or diagnosis of SARS-CoV-2 by FDA under an Emergency Use Authorization (EUA). This EUA will remain in effect (meaning this test can be used) for the duration of the COVID-19 declaration under Section 564(b)(1) of the Act, 21 U.S.C. section 360bbb-3(b)(1), unless the authorization is terminated or revoked.  Performed at [facility], [address] [place], [place] [address]   Blood culture (routine x 2)     Status: None (  Preliminary result)   Collection Time: 01/01/21  8:44 AM   Specimen: BLOOD  Result Value Ref Range Status   Specimen Description BLOOD BLOOD LEFT FOREARM  Final   Special Requests   Final    BOTTLES DRAWN AEROBIC AND ANAEROBIC Blood Culture adequate volume   Culture   Final    NO GROWTH 3 DAYS Performed at [facility], [address] [place], [place] [address]    Report Status PENDING  Incomplete  Blood culture (routine x 2)     Status: None (Preliminary result)   Collection Time: 01/01/21  8:44 AM   Specimen: BLOOD  Result Value Ref Range Status   Specimen Description BLOOD LEFT ANTECUBITAL  Final   Special Requests   Final    BOTTLES DRAWN AEROBIC AND ANAEROBIC Blood Culture  adequate volume   Culture   Final    NO GROWTH 3 DAYS Performed at [facility], [place], [place] [address]    Report Status PENDING  Incomplete  SARS CORONAVIRUS 2 (TAT 6-24 HRS) Nasopharyngeal Nasopharyngeal Swab     Status: None   Collection Time: 01/03/21 10:39 AM   Specimen: Nasopharyngeal Swab  Result Value Ref Range Status   SARS Coronavirus 2 NEGATIVE NEGATIVE Final    Comment: (NOTE) SARS-CoV-2 target nucleic acids are NOT DETECTED.  The SARS-CoV-2 RNA is generally detectable in upper and lower respiratory specimens during the acute phase of infection. Negative results do not preclude SARS-CoV-2 infection, do not rule out co-infections with other pathogens, and should not be used as the sole basis for treatment or other patient management decisions. Negative results must be combined with clinical observations, patient history, and epidemiological information. The expected result is Negative.  Fact Sheet for Patients: SugarRoll.be  Fact Sheet for Healthcare Providers: https://www.woods-mathews.com/  This test is not yet approved or cleared by the [place] FDA and  has been authorized for detection and/or diagnosis of SARS-CoV-2 by FDA under an Emergency Use Authorization (EUA). This EUA will remain  in effect (meaning this test can be used) for the duration of the COVID-19 declaration under Se ction 564(b)(1) of the Act, 21 U.S.C. section 360bbb-3(b)(1), unless the authorization is terminated or revoked sooner.  Performed at [facility], [place] [address], [place], [place] [address]      Labs: BNP (last 3 results) No results for input(s): BNP in the last 8760 hours. Basic Metabolic Panel: Recent Labs  Lab 01/01/21 0754 01/02/21 0446  NA 135 139  K 4.4 3.8  CL 104 107  CO2 22 24  GLUCOSE 113* 114*  BUN 40* 39*  CREATININE 1.28* 1.20  CALCIUM 9.1 9.3   Liver Function  Tests: Recent Labs  Lab 01/01/21 0754  AST 29  ALT 16  ALKPHOS 76  BILITOT 0.8  PROT 7.1  ALBUMIN 3.6   No results for input(s): LIPASE, AMYLASE in the last 168 hours. No results for input(s): AMMONIA in the last 168 hours. CBC: Recent Labs  Lab 01/01/21 0754 01/02/21 0446  WBC 9.1 9.9  HGB 10.8* 10.7*  HCT 31.3* 29.6*  MCV 134.9* 129.8*  PLT 407* 421*   Cardiac Enzymes: Recent Labs  Lab 01/01/21 0754  CKTOTAL 134   BNP: Invalid input(s): POCBNP CBG: No results for input(s): GLUCAP in the last 168 hours. D-Dimer No results for input(s): DDIMER in the last 72 hours. Hgb A1c No results for input(s): HGBA1C in the last 72 hours. Lipid Profile No results for  input(s): CHOL, HDL, LDLCALC, TRIG, CHOLHDL, LDLDIRECT in the last 72 hours. Thyroid function studies No results for input(s): TSH, T4TOTAL, T3FREE, THYROIDAB in the last 72 hours.  Invalid input(s): FREET3 Anemia work up Recent Labs    01/03/21 0524  VITAMINB12 1,689*  FOLATE 30.0   Urinalysis    Component Value Date/Time   COLORURINE YELLOW (A) 11/12/2017 0601   APPEARANCEUR CLEAR (A) 11/12/2017 0601   LABSPEC 1.006 11/12/2017 0601   PHURINE 7.0 11/12/2017 0601   GLUCOSEU NEGATIVE 11/12/2017 0601   HGBUR NEGATIVE 11/12/2017 0601   BILIRUBINUR NEGATIVE 11/12/2017 0601   KETONESUR NEGATIVE 11/12/2017 0601   PROTEINUR NEGATIVE 11/12/2017 0601   NITRITE NEGATIVE 11/12/2017 0601   LEUKOCYTESUR NEGATIVE 11/12/2017 0601   Sepsis Labs Invalid input(s): PROCALCITONIN,  WBC,  LACTICIDVEN Microbiology Recent Results (from the past 240 hour(s))  Resp Panel by RT-PCR (Flu A&B, Covid) Nasopharyngeal Swab     Status: None   Collection Time: 01/01/21  8:44 AM   Specimen: Nasopharyngeal Swab; Nasopharyngeal(NP) swabs in vial transport medium  Result Value Ref Range Status   SARS Coronavirus 2 by RT PCR NEGATIVE NEGATIVE Final    Comment: (NOTE) SARS-CoV-2 target nucleic acids are NOT DETECTED.  The  SARS-CoV-2 RNA is generally detectable in upper respiratory specimens during the acute phase of infection. The lowest concentration of SARS-CoV-2 viral copies this assay can detect is 138 copies/mL. A negative result does not preclude SARS-Cov-2 infection and should not be used as the sole basis for treatment or other patient management decisions. A negative result may occur with  improper specimen collection/handling, submission of specimen other than nasopharyngeal swab, presence of viral mutation(s) within the areas targeted by this assay, and inadequate number of viral copies(<138 copies/mL). A negative result must be combined with clinical observations, patient history, and epidemiological information. The expected result is Negative.  Fact Sheet for Patients:  EntrepreneurPulse.com.au  Fact Sheet for Healthcare Providers:  IncredibleEmployment.be  This test is no t yet approved or cleared by the [place] FDA and  has been authorized for detection and/or diagnosis of SARS-CoV-2 by FDA under an Emergency Use Authorization (EUA). This EUA will remain  in effect (meaning this test can be used) for the duration of the COVID-19 declaration under Section 564(b)(1) of the Act, 21 U.S.C.section 360bbb-3(b)(1), unless the authorization is terminated  or revoked sooner.       Influenza A by PCR NEGATIVE NEGATIVE Final   Influenza B by PCR NEGATIVE NEGATIVE Final    Comment: (NOTE) The Xpert Xpress SARS-CoV-2/FLU/RSV plus assay is intended as an aid in the diagnosis of influenza from Nasopharyngeal swab specimens and should not be used as a sole basis for treatment. Nasal washings and aspirates are unacceptable for Xpert Xpress SARS-CoV-2/FLU/RSV testing.  Fact Sheet for Patients: EntrepreneurPulse.com.au  Fact Sheet for Healthcare Providers: IncredibleEmployment.be  This test is not yet approved or  cleared by the [place] FDA and has been authorized for detection and/or diagnosis of SARS-CoV-2 by FDA under an Emergency Use Authorization (EUA). This EUA will remain in effect (meaning this test can be used) for the duration of the COVID-19 declaration under Section 564(b)(1) of the Act, 21 U.S.C. section 360bbb-3(b)(1), unless the authorization is terminated or revoked.  Performed at [facility], [address] [place], [place] [address]   Blood culture (routine x 2)     Status: None (Preliminary result)   Collection Time: 01/01/21  8:44 AM   Specimen: BLOOD  Result Value Ref  Range Status   Specimen Description BLOOD BLOOD LEFT FOREARM  Final   Special Requests   Final    BOTTLES DRAWN AEROBIC AND ANAEROBIC Blood Culture adequate volume   Culture   Final    NO GROWTH 3 DAYS Performed at [facility], [address] [place], [place] [address]    Report Status PENDING  Incomplete  Blood culture (routine x 2)     Status: None (Preliminary result)   Collection Time: 01/01/21  8:44 AM   Specimen: BLOOD  Result Value Ref Range Status   Specimen Description BLOOD LEFT ANTECUBITAL  Final   Special Requests   Final    BOTTLES DRAWN AEROBIC AND ANAEROBIC Blood Culture adequate volume   Culture   Final    NO GROWTH 3 DAYS Performed at [facility] [place], [place] [address]    Report Status PENDING  Incomplete  SARS CORONAVIRUS 2 (TAT 6-24 HRS) Nasopharyngeal Nasopharyngeal Swab     Status: None   Collection Time: 01/03/21 10:39 AM   Specimen: Nasopharyngeal Swab  Result Value Ref Range Status   SARS Coronavirus 2 NEGATIVE NEGATIVE Final    Comment: (NOTE) SARS-CoV-2 target nucleic acids are NOT DETECTED.  The SARS-CoV-2 RNA is generally detectable in upper and lower respiratory specimens during the acute phase of infection. Negative results do not preclude SARS-CoV-2 infection, do not rule out co-infections with other  pathogens, and should not be used as the sole basis for treatment or other patient management decisions. Negative results must be combined with clinical observations, patient history, and epidemiological information. The expected result is Negative.  Fact Sheet for Patients: SugarRoll.be  Fact Sheet for Healthcare Providers: https://www.woods-mathews.com/  This test is not yet approved or cleared by the [place] FDA and  has been authorized for detection and/or diagnosis of SARS-CoV-2 by FDA under an Emergency Use Authorization (EUA). This EUA will remain  in effect (meaning this test can be used) for the duration of the COVID-19 declaration under Se ction 564(b)(1) of the Act, 21 U.S.C. section 360bbb-3(b)(1), unless the authorization is terminated or revoked sooner.  Performed at [facility], [place] [address], [place], [place] [address]      Patient was seen and examined on the day of discharge and was found to be in stable condition. Time coordinating discharge: 25 minutes including assessment and coordination of care, as well as examination of the patient.   SIGNED:  Dessa Phi, DO [facility] 01/04/2021, 8:50 AM

## 2021-01-04 NOTE — Care Management Important Message (Signed)
Important Message  Patient Details  Name: Lee Mercado MRN: [id number] Date of Birth: [birth year]   Medicare Important Message Given:  Yes     [name] [name] 01/04/2021, 9:47 AM

## 2021-01-04 NOTE — TOC Transition Note (Signed)
Transition of Care [facility]) - CM/SW Discharge Note   Patient Details  Name: Lee Mercado MRN: [id number] Date of Birth: 07/15/1925  Transition of Care [facility]) CM/SW Contact:  Pete Pelt, RN Phone Number: 01/04/2021, 10:42 AM   Clinical Narrative:    TOC in to see patient and son at bedside, patient has order to be transferred to [facility]  [facility], patient will go to [facility], [facility] verbal auth received yesterday for ref [id number] as of today.  Son will present Vaccination Card to facility today, states patient has 2 vaccines and booster for COVID.  Patient and son made aware that pick up will be 1230 by [facility].Patient and son have no further questions or concerns at this time.     Final next level of care: Skilled Nursing Facility Barriers to Discharge: Barriers Resolved   Patient Goals and CMS Choice   CMS Medicare.gov Compare Post Acute Care list provided to:: Patient Choice offered to / list presented to : Patient  Discharge Placement PASRR number recieved: 01/01/21            Patient chooses bed at: [facility] [place] Patient to be transferred to facility by: Ambulance Name of family member notified: Kalyn, Dimattia (Son)   [phone] [facility]) Patient and family notified of of transfer: 01/04/21  Discharge Plan and Services   Discharge Planning Services:  (TBD) Post Acute Care Choice:  (TBD)          DME Arranged:  (NA)   Date DME Agency Contacted:  (NA)     HH Arranged:  (NA)          Social Determinants of Health (SDOH) Interventions     Readmission Risk Interventions No flowsheet data found.

## 2021-01-04 NOTE — Plan of Care (Signed)
Patient is being discharge to [facility]. [facility] has been called. Discharge paperwork in packet for ambulance drivers. Son is at bedside.

## 2021-01-06 LAB — CULTURE, BLOOD (ROUTINE X 2)
Culture: NO GROWTH
Culture: NO GROWTH
Special Requests: ADEQUATE
Special Requests: ADEQUATE

## 2021-01-15 ENCOUNTER — Other Ambulatory Visit: Payer: Self-pay

## 2021-01-15 ENCOUNTER — Inpatient Hospital Stay
Admission: EM | Admit: 2021-01-15 | Discharge: 2021-01-27 | DRG: 177 | Disposition: A | Discharge status: Hospice | Payer: Medicare Other | Source: Skilled Nursing Facility | Attending: Internal Medicine | Admitting: Internal Medicine

## 2021-01-15 ENCOUNTER — Encounter: Payer: Self-pay | Admitting: Internal Medicine

## 2021-01-15 ENCOUNTER — Emergency Department: Payer: Medicare Other

## 2021-01-15 DIAGNOSIS — Z20822 Contact with and (suspected) exposure to covid-19: Secondary | ICD-10-CM | POA: Diagnosis present

## 2021-01-15 DIAGNOSIS — I248 Other forms of acute ischemic heart disease: Secondary | ICD-10-CM | POA: Diagnosis present

## 2021-01-15 DIAGNOSIS — K922 Gastrointestinal hemorrhage, unspecified: Secondary | ICD-10-CM | POA: Diagnosis not present

## 2021-01-15 DIAGNOSIS — E870 Hyperosmolality and hypernatremia: Secondary | ICD-10-CM | POA: Diagnosis not present

## 2021-01-15 DIAGNOSIS — E43 Unspecified severe protein-calorie malnutrition: Secondary | ICD-10-CM | POA: Diagnosis present

## 2021-01-15 DIAGNOSIS — Z96651 Presence of right artificial knee joint: Secondary | ICD-10-CM | POA: Diagnosis present

## 2021-01-15 DIAGNOSIS — G9341 Metabolic encephalopathy: Secondary | ICD-10-CM | POA: Diagnosis present

## 2021-01-15 DIAGNOSIS — I5032 Chronic diastolic (congestive) heart failure: Secondary | ICD-10-CM | POA: Diagnosis present

## 2021-01-15 DIAGNOSIS — D72828 Other elevated white blood cell count: Secondary | ICD-10-CM | POA: Diagnosis present

## 2021-01-15 DIAGNOSIS — D539 Nutritional anemia, unspecified: Secondary | ICD-10-CM | POA: Diagnosis present

## 2021-01-15 DIAGNOSIS — J9601 Acute respiratory failure with hypoxia: Secondary | ICD-10-CM

## 2021-01-15 DIAGNOSIS — J690 Pneumonitis due to inhalation of food and vomit: Secondary | ICD-10-CM | POA: Diagnosis not present

## 2021-01-15 DIAGNOSIS — I482 Chronic atrial fibrillation, unspecified: Secondary | ICD-10-CM | POA: Diagnosis present

## 2021-01-15 DIAGNOSIS — Z85828 Personal history of other malignant neoplasm of skin: Secondary | ICD-10-CM

## 2021-01-15 DIAGNOSIS — D75838 Other thrombocytosis: Secondary | ICD-10-CM | POA: Diagnosis present

## 2021-01-15 DIAGNOSIS — E876 Hypokalemia: Secondary | ICD-10-CM | POA: Diagnosis present

## 2021-01-15 DIAGNOSIS — R1310 Dysphagia, unspecified: Secondary | ICD-10-CM | POA: Diagnosis not present

## 2021-01-15 DIAGNOSIS — Z7189 Other specified counseling: Secondary | ICD-10-CM | POA: Diagnosis not present

## 2021-01-15 DIAGNOSIS — R059 Cough, unspecified: Secondary | ICD-10-CM

## 2021-01-15 DIAGNOSIS — E861 Hypovolemia: Secondary | ICD-10-CM | POA: Diagnosis present

## 2021-01-15 DIAGNOSIS — E039 Hypothyroidism, unspecified: Secondary | ICD-10-CM | POA: Diagnosis present

## 2021-01-15 DIAGNOSIS — K921 Melena: Secondary | ICD-10-CM | POA: Diagnosis not present

## 2021-01-15 DIAGNOSIS — Z515 Encounter for palliative care: Secondary | ICD-10-CM

## 2021-01-15 DIAGNOSIS — Z7901 Long term (current) use of anticoagulants: Secondary | ICD-10-CM

## 2021-01-15 DIAGNOSIS — I110 Hypertensive heart disease with heart failure: Secondary | ICD-10-CM | POA: Diagnosis present

## 2021-01-15 DIAGNOSIS — R627 Adult failure to thrive: Secondary | ICD-10-CM | POA: Diagnosis present

## 2021-01-15 DIAGNOSIS — R600 Localized edema: Secondary | ICD-10-CM | POA: Diagnosis not present

## 2021-01-15 DIAGNOSIS — R64 Cachexia: Secondary | ICD-10-CM | POA: Diagnosis present

## 2021-01-15 DIAGNOSIS — I4510 Unspecified right bundle-branch block: Secondary | ICD-10-CM | POA: Diagnosis present

## 2021-01-15 DIAGNOSIS — E785 Hyperlipidemia, unspecified: Secondary | ICD-10-CM | POA: Diagnosis present

## 2021-01-15 DIAGNOSIS — D72829 Elevated white blood cell count, unspecified: Secondary | ICD-10-CM

## 2021-01-15 DIAGNOSIS — D473 Essential (hemorrhagic) thrombocythemia: Secondary | ICD-10-CM | POA: Diagnosis present

## 2021-01-15 DIAGNOSIS — Z66 Do not resuscitate: Secondary | ICD-10-CM | POA: Diagnosis present

## 2021-01-15 DIAGNOSIS — N179 Acute kidney failure, unspecified: Secondary | ICD-10-CM | POA: Diagnosis present

## 2021-01-15 DIAGNOSIS — D509 Iron deficiency anemia, unspecified: Secondary | ICD-10-CM | POA: Diagnosis present

## 2021-01-15 DIAGNOSIS — Z8673 Personal history of transient ischemic attack (TIA), and cerebral infarction without residual deficits: Secondary | ICD-10-CM

## 2021-01-15 DIAGNOSIS — T17908A Unspecified foreign body in respiratory tract, part unspecified causing other injury, initial encounter: Secondary | ICD-10-CM

## 2021-01-15 DIAGNOSIS — E860 Dehydration: Secondary | ICD-10-CM | POA: Diagnosis present

## 2021-01-15 DIAGNOSIS — Z6825 Body mass index (BMI) 25.0-25.9, adult: Secondary | ICD-10-CM

## 2021-01-15 DIAGNOSIS — Z79899 Other long term (current) drug therapy: Secondary | ICD-10-CM

## 2021-01-15 DIAGNOSIS — Z79890 Hormone replacement therapy (postmenopausal): Secondary | ICD-10-CM

## 2021-01-15 LAB — CBC
HCT: 32.6 % — ABNORMAL LOW (ref 39.0–52.0)
Hemoglobin: 11.5 g/dL — ABNORMAL LOW (ref 13.0–17.0)
MCH: 45.5 pg — ABNORMAL HIGH (ref 26.0–34.0)
MCHC: 35.3 g/dL (ref 30.0–36.0)
MCV: 128.9 fL — ABNORMAL HIGH (ref 80.0–100.0)
Platelets: 427 10*3/uL — ABNORMAL HIGH (ref 150–400)
RBC: 2.53 MIL/uL — ABNORMAL LOW (ref 4.22–5.81)
RDW: 13.4 % (ref 11.5–15.5)
WBC: 17.1 10*3/uL — ABNORMAL HIGH (ref 4.0–10.5)
nRBC: 0 % (ref 0.0–0.2)

## 2021-01-15 LAB — COMPREHENSIVE METABOLIC PANEL
ALT: 18 U/L (ref 0–44)
AST: 22 U/L (ref 15–41)
Albumin: 3 g/dL — ABNORMAL LOW (ref 3.5–5.0)
Alkaline Phosphatase: 102 U/L (ref 38–126)
Anion gap: 12 (ref 5–15)
BUN: 24 mg/dL — ABNORMAL HIGH (ref 8–23)
CO2: 29 mmol/L (ref 22–32)
Calcium: 8.9 mg/dL (ref 8.9–10.3)
Chloride: 101 mmol/L (ref 98–111)
Creatinine, Ser: 1.09 mg/dL (ref 0.61–1.24)
GFR, Estimated: >60 mL/min (ref >60)
Glucose, Bld: 147 mg/dL — ABNORMAL HIGH (ref 70–99)
Potassium: 3 mmol/L — ABNORMAL LOW (ref 3.5–5.1)
Sodium: 142 mmol/L (ref 135–145)
Total Bilirubin: 1.4 mg/dL — ABNORMAL HIGH (ref 0.3–1.2)
Total Protein: 6.8 g/dL (ref 6.5–8.1)

## 2021-01-15 LAB — LACTIC ACID, PLASMA: Lactic Acid, Venous: 1.4 mmol/L (ref 0.5–1.9)

## 2021-01-15 LAB — BRAIN NATRIURETIC PEPTIDE: B Natriuretic Peptide: 274.6 pg/mL — ABNORMAL HIGH (ref 0.0–100.0)

## 2021-01-15 LAB — RESP PANEL BY RT-PCR (FLU A&B, COVID) ARPGX2
Influenza A by PCR: NEGATIVE
Influenza B by PCR: NEGATIVE
SARS Coronavirus 2 by RT PCR: NEGATIVE

## 2021-01-15 LAB — TROPONIN I (HIGH SENSITIVITY)
Troponin I (High Sensitivity): 181 ng/L (ref <18)
Troponin I (High Sensitivity): 183 ng/L (ref <18)

## 2021-01-15 MED ORDER — KCL-LACTATED RINGERS-D5W 20 MEQ/L IV SOLN
INTRAVENOUS | Status: AC
  Filled 2021-01-15 (×6): qty 1000

## 2021-01-15 MED ORDER — ENOXAPARIN SODIUM 40 MG/0.4ML ~~LOC~~ SOLN
40.0000 mg | SUBCUTANEOUS | Status: DC
  Administered 2021-01-15: 21:00:00 40 mg via SUBCUTANEOUS
  Filled 2021-01-15: qty 0.4

## 2021-01-15 MED ORDER — SODIUM CHLORIDE 0.9 % IV SOLN
500.0000 mg | Freq: Once | INTRAVENOUS | Status: AC
  Administered 2021-01-15: 500 mg via INTRAVENOUS
  Filled 2021-01-15: qty 500

## 2021-01-15 MED ORDER — SODIUM CHLORIDE 0.9 % IV SOLN
1.0000 g | Freq: Once | INTRAVENOUS | Status: AC
  Administered 2021-01-15: 1 g via INTRAVENOUS
  Filled 2021-01-15: qty 10

## 2021-01-15 MED ORDER — SODIUM CHLORIDE 0.9 % IV SOLN
3.0000 g | Freq: Four times a day (QID) | INTRAVENOUS | Status: AC
  Administered 2021-01-15 – 2021-01-21 (×24): 3 g via INTRAVENOUS
  Filled 2021-01-15: qty 8
  Filled 2021-01-15: qty 3
  Filled 2021-01-15: qty 8
  Filled 2021-01-15 (×2): qty 3
  Filled 2021-01-15 (×2): qty 8
  Filled 2021-01-15 (×2): qty 3
  Filled 2021-01-15 (×2): qty 8
  Filled 2021-01-15: qty 3
  Filled 2021-01-15 (×2): qty 8
  Filled 2021-01-15: qty 3
  Filled 2021-01-15: qty 8
  Filled 2021-01-15 (×2): qty 3
  Filled 2021-01-15: qty 8
  Filled 2021-01-15: qty 3
  Filled 2021-01-15 (×2): qty 8
  Filled 2021-01-15: qty 3
  Filled 2021-01-15: qty 8

## 2021-01-15 MED ORDER — LEVOTHYROXINE SODIUM 100 MCG/5ML IV SOLN
25.0000 ug | Freq: Every day | INTRAVENOUS | Status: DC
  Administered 2021-01-16: 10:00:00 25 ug via INTRAVENOUS
  Filled 2021-01-15: qty 5

## 2021-01-15 NOTE — Progress Notes (Signed)
Pharmacy Antibiotic Note  Lee Mercado is a 87 y.o. male admitted on 01/15/2021 with aspiration pneumonia.  Pharmacy has been consulted for Unasyn dosing.  Plan: Ordered Unasyn 3gm q6h per indication and current renal fxn.  Pharmacy will continue to follow SCr and adjust abx dosing when warranted.  Height: 5\' 11"  (180.3 cm) Weight: 81.6 kg (180 lb) IBW/kg (Calculated) : 75.3  Temp (24hrs), Avg:97.8 F (36.6 C), Min:97.7 F (36.5 C), Max:97.8 F (36.6 C)  Recent Labs  Lab 01/15/21 1215 01/15/21 1902  WBC 17.1*  --   CREATININE 1.09  --   LATICACIDVEN  --  1.4    Estimated Creatinine Clearance: 45.1 mL/min (by C-G formula based on SCr of 1.09 mg/dL).    No Known Allergies  Antimicrobials this admission: 4/18 Azithromycin >> x 1 4/18 Ceftriaxone >> x 1 4/18 Unasyn >>    Microbiology results: 4/18 BCx: Pending  Thank you for allowing pharmacy to be a part of this patient's care.  Renda Rolls, PharmD, [facility] 01/16/2021 4:48 AM

## 2021-01-15 NOTE — ED Provider Notes (Signed)
[facility] Emergency Department Provider Note   ____________________________________________    I have reviewed the triage vital signs and the nursing notes.   HISTORY  Chief Complaint Shortness of Breath     HPI Lee Mercado is a 87 y.o. male with history of paroxysmal atrial fibrillation, hypertension, hyperlipidemia, who presents with complaints of shortness of breath.  Patient describes productive cough, denies fevers chills, no significant pleurisy.  No diaphoresis.  No calf pain or swelling.  Discharged from the hospital on April 7 after being admitted for acute on chronic bronchitis per CT scan.  Has been doing well since then.  Worsened overnight   Past Medical History:  Diagnosis Date  . A-fib ([place])   . Cancer ([place])    skin cancers  . Coagulation defect ([place])   . Finger injury   . History of heat stroke   . Hyperlipidemia   . Hypertension   . Previous back surgery   . Stroke ([place])   . Vertigo     Patient Active Problem List   Diagnosis Date Noted  . Acute respiratory failure with hypoxia ([place]) 01/15/2021  . Lower extremity edema   . Acute respiratory failure ([place]) 01/01/2021  . Acute bronchitis 01/01/2021  . Hypothyroidism 11/21/2017  . Vertigo 11/20/2017  . Macrocytic anemia 11/20/2017  . Gait instability 11/20/2017  . Prediabetes 11/20/2017  . Do not resuscitate 11/12/2017  . TIA (transient ischemic attack) 11/11/2017  . B12 deficiency 09/04/2017  . CKD (chronic kidney disease) stage 3, GFR 30-59 ml/min (HCC) 09/04/2017  . Carpal tunnel syndrome 08/07/2017  . Essential thrombocytosis ([place]) 08/06/2017  . Hyperlipidemia 08/06/2017  . Atrial fibrillation ([place]) 08/06/2017    Past Surgical History:  Procedure Laterality Date  . BACK SURGERY  2003  . FINGER DEBRIDEMENT  1963   Left hand 4 digit   . REPLACEMENT TOTAL KNEE Right 2006    Prior to Admission medications   Medication Sig Start Date End Date Taking?  Authorizing Provider  acetaminophen (TYLENOL) 500 MG tablet Take 500 mg by mouth every 6 (six) hours as needed for mild pain.   Yes [provider]  Cholecalciferol (VITAMIN D) 2000 units CAPS Take 1 capsule (2,000 Units total) by mouth daily. 12/18/17  Yes Plonk, Gwyndolyn Saxon, MD  cyanocobalamin 1000 MCG tablet Take 1 tablet (1,000 mcg total) by mouth daily. 03/24/18  Yes Juline Patch, MD  dextromethorphan-guaiFENesin [facility] DM) 30-600 MG 12hr tablet Take 1 tablet by mouth 2 (two) times daily. 01/10/21 01/24/21 Yes [provider]  diclofenac sodium (VOLTAREN) 1 % GEL Apply topically 4 (four) times daily as needed (pain).   Yes [provider]  ELIQUIS 2.5 MG TABS tablet Take 2.5 mg by mouth 2 (two) times daily. 12/26/20  Yes [provider]  furosemide (LASIX) 20 MG tablet Take 20 mg by mouth daily.   Yes [provider]  hydroxyurea (HYDREA) 500 MG capsule Take 1 capsule (500 mg total) by mouth daily. May take with food to minimize GI side effects. 03/24/18  Yes Juline Patch, MD  levothyroxine (SYNTHROID) 50 MCG tablet Take 50 mcg by mouth daily. 12/26/20  Yes [provider]  meclizine (ANTIVERT) 25 MG tablet Take 25 mg by mouth 2 (two) times daily.   Yes [provider]  metoprolol tartrate (LOPRESSOR) 25 MG tablet Take 0.5 tablets (12.5 mg total) by mouth 2 (two) times daily. 03/24/18  Yes Juline Patch, MD  Multiple Vitamin (MULTIVITAMIN) tablet Take  1 tablet daily by mouth.   Yes [provider]  potassium chloride SA (KLOR-CON) 20 MEQ tablet Take 20 mEq by mouth daily.   Yes [provider]  pravastatin (PRAVACHOL) 20 MG tablet TAKE 1 TABLET BY MOUTH DAILY Patient taking differently: Take 20 mg by mouth daily at 6 PM. 12/18/18  Yes Juline Patch, MD     Allergies Patient has no known allergies.  Family History  Problem Relation Age of Onset  . Cancer Mother        breast  . Emphysema Father   . Diabetes  Sister   . Paget's disease of bone Brother   . Cancer Sister        lung cancer  . Healthy Sister     Social History Social History   Tobacco Use  . Smoking status: Never Smoker  . Smokeless tobacco: Never Used  . Tobacco comment: smoking cessation materials not required  Vaping Use  . Vaping Use: Never used  Substance Use Topics  . Alcohol use: No  . Drug use: No    Review of Systems  Constitutional: No fever/chills Eyes: No visual changes.  ENT: No sore throat. Cardiovascular: Denies chest pain. Respiratory: As above Gastrointestinal: No abdominal pain.  No nausea, no vomiting.   Genitourinary: Negative for dysuria. Musculoskeletal: Negative for back pain. Skin: Negative for rash. Neurological: Negative for headaches or weakness   ____________________________________________   PHYSICAL EXAM:  VITAL SIGNS: ED Triage Vitals  Enc Vitals Group     BP 01/15/21 1220 (!) 104/91     Pulse Rate 01/15/21 1214 74     Resp 01/15/21 1214 16     Temp 01/15/21 1214 97.7 F (36.5 C)     Temp Source 01/15/21 1214 Oral     SpO2 01/15/21 1214 94 %     Weight 01/15/21 1210 81.6 kg (180 lb)     Height 01/15/21 1210 1.803 m (5\' 11" )     Head Circumference --      [facility] Flow --      Pain Score --      Pain Loc --      Pain Edu? --      Excl. in [place]? --     Constitutional: Alert and oriented.   Nose: No congestion/rhinnorhea. Mouth/Throat: Mucous membranes are moist.    Cardiovascular: Normal rate, regular rhythm. Grossly normal heart sounds.  Good peripheral circulation. Respiratory: Increased respiratory effort with mild tachypnea, diffuse Rales Gastrointestinal: Soft and nontender. No distention.  No CVA tenderness.  Musculoskeletal: No lower extremity tenderness nor edema.  Warm and well perfused Neurologic:  Normal speech and language. No gross focal neurologic deficits are appreciated.  Skin:  Skin is warm, dry and intact. No rash noted. Psychiatric: Mood and  affect are normal. Speech and behavior are normal.  ____________________________________________   LABS (all labs ordered are listed, but only abnormal results are displayed)  Labs Reviewed  CBC - Abnormal; Notable for the following components:      Result Value   WBC 17.1 (*)    RBC 2.53 (*)    Hemoglobin 11.5 (*)    HCT 32.6 (*)    MCV 128.9 (*)    MCH 45.5 (*)    Platelets 427 (*)    All other components within normal limits  COMPREHENSIVE METABOLIC PANEL - Abnormal; Notable for the following components:   Potassium 3.0 (*)    Glucose, Bld 147 (*)    BUN 24 (*)  Albumin 3.0 (*)    Total Bilirubin 1.4 (*)    All other components within normal limits  BRAIN NATRIURETIC PEPTIDE - Abnormal; Notable for the following components:   B Natriuretic Peptide 274.6 (*)    All other components within normal limits  TROPONIN I (HIGH SENSITIVITY) - Abnormal; Notable for the following components:   Troponin I (High Sensitivity) 181 (*)    All other components within normal limits  RESP PANEL BY RT-PCR (FLU A&B, COVID) ARPGX2  CULTURE, BLOOD (ROUTINE X 2)  CULTURE, BLOOD (ROUTINE X 2)  LACTIC ACID, PLASMA  CBC  CREATININE, SERUM   ____________________________________________  EKG  ED ECG REPORT I, Lavonia Drafts, the attending physician, personally viewed and interpreted this ECG.  Date: 01/15/2021  Rhythm: normal sinus rhythm QRS Axis: normal Intervals: Right bundle branch block ST/T Wave abnormalities: normal Narrative Interpretation: no evidence of acute ischemia  ____________________________________________  RADIOLOGY  Chest x-ray reviewed by me, possible left-sided infiltrate ____________________________________________   PROCEDURES  Procedure(s) performed: No  Procedures   Critical Care performed: yes  CRITICAL CARE Performed by: Lavonia Drafts   Total critical care time: 30  minutes  Critical care time was exclusive of separately billable  procedures and treating other patients.  Critical care was necessary to treat or prevent imminent or life-threatening deterioration.  Critical care was time spent personally by me on the following activities: development of treatment plan with patient and/or surrogate as well as nursing, discussions with consultants, evaluation of patient's response to treatment, examination of patient, obtaining history from patient or surrogate, ordering and performing treatments and interventions, ordering and review of laboratory studies, ordering and review of radiographic studies, pulse oximetry and re-evaluation of patient's condition.  ____________________________________________   INITIAL IMPRESSION / ASSESSMENT AND PLAN / ED COURSE  Pertinent labs & imaging results that were available during my care of the patient were reviewed by me and considered in my medical decision making (see chart for details).  Patient presents with shortness of breath as described above.  Afebrile in the emergency department.  Lab work demonstrates elevated white blood cell count of 17,000.  Patient covered with IV Rocephin and IV Zithromax for presumed pneumonia or continued bronchitis for which he was admitted 1.5 weeks ago  He is requiring nasal cannula oxygen to keep his saturations above 90%.  BNP 274  Troponin of 181, likely related to strain, patient denies chest pain  Patient admitted to the hospitalist service    ____________________________________________   FINAL CLINICAL IMPRESSION(S) / ED DIAGNOSES  Final diagnoses:  Acute respiratory failure with hypoxia ([place])        Note:  This document was prepared using Dragon voice recognition software and may include unintentional dictation errors.   Lavonia Drafts, MD 01/15/21 1359

## 2021-01-15 NOTE — H&P (Addendum)
History and Physical  Lee Mercado DUK:[id number] DOB: 12-12-1924 DOA: 01/15/2021  Referring physician: Dr. Corky Mercado, [place]  PCP: Lee Orleans, MD  Outpatient Specialists: Cardiology, ophthalmology, dermatology, hematology oncology. Patient coming from: SNF.  Chief Complaint: Shortness of breath, productive cough.  HPI: Lee Mercado is a 87 y.o. male with medical history significant for essential thrombocytosis, macrocytic anemia, hypothyroidism, CVA, paroxysmal A. fib on Eliquis, chronic dizziness and vertigo who presented to [facility] ED from SNF due to shortness of breath, productive cough, and hypoxemia, 86% on room air, reported by EDP.  Unable to obtain history from the patient due to confusion.  History is mainly obtained from EDP and review of medical records.  Patient was recently discharged from the hospital to SNF about 2 weeks ago after admission for acute on chronic bronchitis.  Returns with complaint of shortness of breath and a productive cough.  No fevers or chills.  No reported chest pain.  His symptoms worsened overnight.  Chest x-ray unrevealing however patient failed a swallow evaluation at bedside in the ED.  Made n.p.o. due to concern for aspiration.  He received Rocephin and azithromycin in the ED.  Started on Unasyn for suspected aspiration pneumonia.  Lee Mercado, hospitalist team, was asked to admit.  Called and spoke with the patient's son, Lee Mercado, via phone.  No prior history of dementia.  Due to his confusion will obtain a CT head to rule out any acute intracranial abnormalities.  Patient is full code per his son Lee Mercado via phone.  ED Course:  Afebrile.  BP 111/54, pulse 85, respiration rate 18, O2 saturation 100% on 3 L.  Lab studies remarkable for WBC 17,000, hemoglobin 11.5, MCV 128, platelet count 427.  Troponin 181, BNP 274.  Review of Systems: Review of systems as noted in the HPI. All other systems reviewed and are negative.   Past Medical History:  Diagnosis Date  . A-fib  ([place])   . Cancer ([place])    skin cancers  . Coagulation defect ([place])   . Finger injury   . History of heat stroke   . Hyperlipidemia   . Hypertension   . Previous back surgery   . Stroke ([place])   . Vertigo    Past Surgical History:  Procedure Laterality Date  . BACK SURGERY  2003  . FINGER DEBRIDEMENT  1963   Left hand 4 digit   . REPLACEMENT TOTAL KNEE Right 2006    Social History:  reports that he has never smoked. He has never used smokeless tobacco. He reports that he does not drink alcohol and does not use drugs.   No Known Allergies  Family History  Problem Relation Age of Onset  . Cancer Mother        breast  . Emphysema Father   . Diabetes Sister   . Paget's disease of bone Brother   . Cancer Sister        lung cancer  . Healthy Sister       Prior to Admission medications   Medication Sig Start Date End Date Taking? Authorizing Provider  acetaminophen (TYLENOL) 500 MG tablet Take 500 mg by mouth every 6 (six) hours as needed for mild pain.   Yes [provider]  Cholecalciferol (VITAMIN D) 2000 units CAPS Take 1 capsule (2,000 Units total) by mouth daily. 12/18/17  Yes Plonk, Gwyndolyn Saxon, MD  cyanocobalamin 1000 MCG tablet Take 1 tablet (1,000 mcg total) by mouth daily. 03/24/18  Yes Juline Patch, MD  dextromethorphan-guaiFENesin [facility] DM)  30-600 MG 12hr tablet Take 1 tablet by mouth 2 (two) times daily. 01/10/21 01/24/21 Yes [provider]  diclofenac sodium (VOLTAREN) 1 % GEL Apply topically 4 (four) times daily as needed (pain).   Yes [provider]  ELIQUIS 2.5 MG TABS tablet Take 2.5 mg by mouth 2 (two) times daily. 12/26/20  Yes [provider]  furosemide (LASIX) 20 MG tablet Take 20 mg by mouth daily.   Yes [provider]  hydroxyurea (HYDREA) 500 MG capsule Take 1 capsule (500 mg total) by mouth daily. May take with food to minimize GI side effects. 03/24/18  Yes Juline Patch, MD  levothyroxine (SYNTHROID) 50  MCG tablet Take 50 mcg by mouth daily. 12/26/20  Yes [provider]  meclizine (ANTIVERT) 25 MG tablet Take 25 mg by mouth 2 (two) times daily.   Yes [provider]  metoprolol tartrate (LOPRESSOR) 25 MG tablet Take 0.5 tablets (12.5 mg total) by mouth 2 (two) times daily. 03/24/18  Yes Juline Patch, MD  Multiple Vitamin (MULTIVITAMIN) tablet Take 1 tablet daily by mouth.   Yes [provider]  potassium chloride SA (KLOR-CON) 20 MEQ tablet Take 20 mEq by mouth daily.   Yes [provider]  pravastatin (PRAVACHOL) 20 MG tablet TAKE 1 TABLET BY MOUTH DAILY Patient taking differently: Take 20 mg by mouth daily at 6 PM. 12/18/18  Yes Juline Patch, MD    Physical Exam: BP 97/80   Pulse 76   Temp 97.7 F (36.5 C) (Oral)   Resp 20   Ht 5\' 11"  (1.803 m)   Wt 81.6 kg   SpO2 100%   BMI 25.10 kg/m   . General: 87 y.o. year-old male well developed well nourished in no acute distress.  Alert and confused. . Cardiovascular: Regular rate and rhythm with no rubs or gallops.  No thyromegaly or JVD noted.  Trace lower extremity edema bilaterally. [place] Kitchen Respiratory: Diffuse rales bilaterally, no wheezing noted.  Poor inspiratory effort. . Abdomen: Soft nontender nondistended with normal bowel sounds x4 quadrants. . Muskuloskeletal: No cyanosis or clubbing.  Trace lower extremity edema bilaterally. . Neuro: CN II-XII intact, strength, sensation, reflexes . Skin: No ulcerative lesions noted or rashes . Psychiatry: Judgement and insight appear altered. Mood is appropriate for condition and setting          Labs on Admission:  Basic Metabolic Panel: Recent Labs  Lab 01/15/21 1215  NA 142  K 3.0*  CL 101  CO2 29  GLUCOSE 147*  BUN 24*  CREATININE 1.09  CALCIUM 8.9   Liver Function Tests: Recent Labs  Lab 01/15/21 1215  AST 22  ALT 18  ALKPHOS 102  BILITOT 1.4*  PROT 6.8  ALBUMIN 3.0*   No results for input(s): LIPASE, AMYLASE in the last 168  hours. No results for input(s): AMMONIA in the last 168 hours. CBC: Recent Labs  Lab 01/15/21 1215  WBC 17.1*  HGB 11.5*  HCT 32.6*  MCV 128.9*  PLT 427*   Cardiac Enzymes: No results for input(s): CKTOTAL, CKMB, CKMBINDEX, TROPONINI in the last 168 hours.  BNP (last 3 results) Recent Labs    01/15/21 1215  BNP 274.6*    ProBNP (last 3 results) No results for input(s): PROBNP in the last 8760 hours.  CBG: No results for input(s): GLUCAP in the last 168 hours.  Radiological Exams on Admission: DG Chest Port 1 View  Result Date: 01/15/2021 CLINICAL DATA:  Shortness of breath and  dizziness. EXAM: PORTABLE CHEST 1 VIEW COMPARISON:  01/01/2021 FINDINGS: The cardiac silhouette, mediastinal and hilar contours are within normal limits and stable. The lungs are clear of an acute process. Low lung volumes with vascular crowding and streaky basilar atelectasis but no infiltrates, edema or effusions. The bony thorax is intact. IMPRESSION: Low lung volumes with vascular crowding and streaky basilar atelectasis. Electronically Signed   By: Marijo Sanes M.D.   On: 01/15/2021 12:45    EKG: I independently viewed the EKG done and my findings are as followed: None available at time of this visit.  Assessment/Plan Present on Admission: . Acute respiratory failure with hypoxia (HCC)  Active Problems:   Acute respiratory failure with hypoxia (HCC)  Acute hypoxic respiratory failure secondary to suspected aspiration pneumonia, POA Unable to obtain a history from the patient due to confusion. Per EDP presented with O2 saturation 86% on room air associated with productive cough and leukocytosis with WBC 17,000. Failed swallow evaluation at bedside in the ED. Maintain a saturation greater than 92%. Currently on 3 L with saturation of 100%. N.p.o. until Passes swallow evaluation. Started on Unasyn empirically for suspected aspiration pneumonia.  Suspected aspiration pneumonia, POA. Speech  therapist consulted for swallow evaluation N.p.o. until passes swallow evaluation Unasyn empirically. Monitor fever curve and WBC  Elevated troponin, suspect demand ischemia Initial troponin 181 Repeat troponin Obtain twelve-lead EKG Cycle troponin Unable to obtain a history from the patient due to confusion. Obtain 2D echo.  Acute metabolic encephalopathy, unclear etiology. No family at bedside Confused at the time of this visit We will reach out to family-see note stated above. CT head without contrast, stat. Delirium precautions Aspiration precautions Fall precautions  Paroxysmal A. fib on Eliquis Rate controlled Eliquis on hold due to n.p.o. Follow CT head, if no acute finding can start heparin drip while n.p.o.  For secondary CVA prevention  Hypokalemia Serum potassium 3.0, goal 4.0. Started gentle IV fluid hydration LRD 5 KCl 20 mEq at 50 cc/h x 2 days due to n.p.o. Repeat chemistry panel in the morning  Isolated elevated T bilirubin T bili 1.4 Nonspecific Alkaline phosphatase, AST and ALT normal Repeat CMP in the morning.  Chronic diastolic CHF Last 2D echo on 11/11/2017 revealed LVEF 55 to 65% Obtain 2D echo on 01/15/2021 due to elevated troponin Currently hypovolemic on exam. Hold off p.o. home Lasix Strict I's and O's and daily weight  Hypothyroidism Resume home levothyroxine, intravenously since NPO.  Essential thrombocytosis/hyperlipidemia Resume home regimen when no longer NPO.  History of cerebral stroke On Eliquis for secondary CVA prevention Eliquis on hold due to n.p.o. CT head ordered, follow results If CT head nonacute start heparin drip for paroxysmal A. Fib. Resume home pravastatin when no longer NPO.   DVT prophylaxis: Heparin drip if CT head nonacute.  Code Status: Full code as stated by his son via phone.  Family Communication: None at bedside.  Updated his son Lee Mercado via phone.  Disposition Plan: Admit to MedSurg unit with remote  telemetry.  Consults called: Cardiology.  Admission status: Inpatient status.  Patient will require at least 2 midnights for further evaluation and treatment of present condition.   Status is: Inpatient    Dispo:  Patient From: [place]  Planned Disposition: [place]  Medically stable for discharge: No, ongoing management of acute hypoxic respiratory failure and suspected aspiration pneumonia.         Kayleen Memos MD [facility] Pager [phone]  If 7PM-7AM, please  contact night-coverage [email or web address] Password [facility]  01/15/2021, 5:19 PM

## 2021-01-15 NOTE — ED Triage Notes (Signed)
Pt via EMS from [facility]. Staff called out for SOB and dizziness. Pt has also has a productive cough. Pt able to answer questions. Pt is alert but oriented to only self and place.

## 2021-01-16 ENCOUNTER — Inpatient Hospital Stay: Payer: Medicare Other

## 2021-01-16 DIAGNOSIS — R1310 Dysphagia, unspecified: Secondary | ICD-10-CM

## 2021-01-16 DIAGNOSIS — Z7189 Other specified counseling: Secondary | ICD-10-CM

## 2021-01-16 DIAGNOSIS — Z515 Encounter for palliative care: Secondary | ICD-10-CM

## 2021-01-16 LAB — COMPREHENSIVE METABOLIC PANEL
ALT: 14 U/L (ref 0–44)
AST: 18 U/L (ref 15–41)
Albumin: 2.5 g/dL — ABNORMAL LOW (ref 3.5–5.0)
Alkaline Phosphatase: 86 U/L (ref 38–126)
Anion gap: 10 (ref 5–15)
BUN: 26 mg/dL — ABNORMAL HIGH (ref 8–23)
CO2: 29 mmol/L (ref 22–32)
Calcium: 8.2 mg/dL — ABNORMAL LOW (ref 8.9–10.3)
Chloride: 103 mmol/L (ref 98–111)
Creatinine, Ser: 1.1 mg/dL (ref 0.61–1.24)
GFR, Estimated: >60 mL/min (ref >60)
Glucose, Bld: 144 mg/dL — ABNORMAL HIGH (ref 70–99)
Potassium: 2.9 mmol/L — ABNORMAL LOW (ref 3.5–5.1)
Sodium: 142 mmol/L (ref 135–145)
Total Bilirubin: 0.9 mg/dL (ref 0.3–1.2)
Total Protein: 5.8 g/dL — ABNORMAL LOW (ref 6.5–8.1)

## 2021-01-16 LAB — CBC WITH DIFFERENTIAL/PLATELET
Abs Immature Granulocytes: 0.24 10*3/uL — ABNORMAL HIGH (ref 0.00–0.07)
Basophils Absolute: 0 10*3/uL (ref 0.0–0.1)
Basophils Relative: 0 %
Eosinophils Absolute: 0 10*3/uL (ref 0.0–0.5)
Eosinophils Relative: 0 %
HCT: 28.5 % — ABNORMAL LOW (ref 39.0–52.0)
Hemoglobin: 10.1 g/dL — ABNORMAL LOW (ref 13.0–17.0)
Immature Granulocytes: 2 %
Lymphocytes Relative: 7 %
Lymphs Abs: 1.1 10*3/uL (ref 0.7–4.0)
MCH: 45.5 pg — ABNORMAL HIGH (ref 26.0–34.0)
MCHC: 35.4 g/dL (ref 30.0–36.0)
MCV: 128.4 fL — ABNORMAL HIGH (ref 80.0–100.0)
Monocytes Absolute: 1.5 10*3/uL — ABNORMAL HIGH (ref 0.1–1.0)
Monocytes Relative: 10 %
Neutro Abs: 12.2 10*3/uL — ABNORMAL HIGH (ref 1.7–7.7)
Neutrophils Relative %: 81 %
Platelets: 374 10*3/uL (ref 150–400)
RBC: 2.22 MIL/uL — ABNORMAL LOW (ref 4.22–5.81)
RDW: 13.5 % (ref 11.5–15.5)
Smear Review: NORMAL
WBC: 15.1 10*3/uL — ABNORMAL HIGH (ref 4.0–10.5)
nRBC: 0 % (ref 0.0–0.2)

## 2021-01-16 LAB — TROPONIN I (HIGH SENSITIVITY): Troponin I (High Sensitivity): 182 ng/L (ref <18)

## 2021-01-16 LAB — HEPARIN LEVEL (UNFRACTIONATED): Heparin Unfractionated: >1.1 IU/mL — ABNORMAL HIGH (ref 0.30–0.70)

## 2021-01-16 LAB — PHOSPHORUS: Phosphorus: 2.4 mg/dL — ABNORMAL LOW (ref 2.5–4.6)

## 2021-01-16 LAB — MAGNESIUM: Magnesium: 1.9 mg/dL (ref 1.7–2.4)

## 2021-01-16 LAB — APTT
aPTT: 50 seconds — ABNORMAL HIGH (ref 24–36)
aPTT: 99 seconds — ABNORMAL HIGH (ref 24–36)
aPTT: 140 seconds — ABNORMAL HIGH (ref 24–36)
aPTT: 120 seconds — ABNORMAL HIGH (ref 24–36)

## 2021-01-16 LAB — PROTIME-INR
INR: 1.9 — ABNORMAL HIGH (ref 0.8–1.2)
Prothrombin Time: 21.6 seconds — ABNORMAL HIGH (ref 11.4–15.2)

## 2021-01-16 MED ORDER — HYDROXYUREA 500 MG PO CAPS
500.0000 mg | ORAL_CAPSULE | Freq: Every day | ORAL | Status: DC
Start: 2021-01-16 — End: 2021-01-25
  Administered 2021-01-17 – 2021-01-24 (×8): 500 mg via ORAL
  Filled 2021-01-16 (×10): qty 1

## 2021-01-16 MED ORDER — LEVOTHYROXINE SODIUM 50 MCG PO TABS
50.0000 ug | ORAL_TABLET | Freq: Every day | ORAL | Status: DC
  Administered 2021-01-17 – 2021-01-24 (×8): 50 ug via ORAL
  Filled 2021-01-16 (×8): qty 1

## 2021-01-16 MED ORDER — ADULT MULTIVITAMIN W/MINERALS CH
1.0000 | ORAL_TABLET | Freq: Every day | ORAL | Status: DC
  Administered 2021-01-17 – 2021-01-24 (×8): 1 via ORAL
  Filled 2021-01-16 (×8): qty 1

## 2021-01-16 MED ORDER — SODIUM CHLORIDE 0.9 % IV SOLN
INTRAVENOUS | Status: DC | PRN
  Administered 2021-01-16 – 2021-01-17 (×3): 250 mL via INTRAVENOUS
  Administered 2021-01-19: 500 mL via INTRAVENOUS

## 2021-01-16 MED ORDER — HEPARIN (PORCINE) 25000 UT/250ML-% IV SOLN
1050.0000 [IU]/h | INTRAVENOUS | Status: DC
  Administered 2021-01-16: 04:00:00 1150 [IU]/h via INTRAVENOUS
  Administered 2021-01-17: 05:00:00 950 [IU]/h via INTRAVENOUS
  Administered 2021-01-18: 1050 [IU]/h via INTRAVENOUS
  Filled 2021-01-16 (×3): qty 250

## 2021-01-16 MED ORDER — METOPROLOL TARTRATE 25 MG PO TABS
12.5000 mg | ORAL_TABLET | Freq: Two times a day (BID) | ORAL | Status: DC
  Administered 2021-01-16 – 2021-01-24 (×16): 12.5 mg via ORAL
  Filled 2021-01-16 (×18): qty 1

## 2021-01-16 MED ORDER — POTASSIUM CHLORIDE 10 MEQ/100ML IV SOLN
10.0000 meq | INTRAVENOUS | Status: AC
  Administered 2021-01-16 (×2): 10 meq via INTRAVENOUS
  Filled 2021-01-16 (×2): qty 100

## 2021-01-16 MED ORDER — NEPRO/CARBSTEADY PO LIQD
237.0000 mL | ORAL | Status: DC
  Administered 2021-01-16 – 2021-01-23 (×6): 237 mL via ORAL

## 2021-01-16 NOTE — Progress Notes (Signed)
ANTICOAGULATION CONSULT NOTE   Pharmacy Consult for heparin infusion Indication: atrial fibrillation  No Known Allergies  Patient Measurements: Height: 5\' 11"  (180.3 cm) Weight: 81.6 kg (180 lb) IBW/kg (Calculated) : 75.3 Heparin Dosing Weight: 81.6 kg  Vital Signs: Temp: 98.8 F (37.1 C) (04/19 0732) Temp Source: Oral (04/19 0413) BP: 144/72 (04/19 0732) Pulse Rate: 73 (04/19 0732)  Labs: Recent Labs    01/15/21 1215 01/15/21 2304 01/16/21 0107 01/16/21 0349 01/16/21 1151  HGB 11.5*  --  10.1*  --   --   HCT 32.6*  --  28.5*  --   --   PLT 427*  --  374  --   --   APTT  --   --   --  50* 99*  LABPROT  --   --   --  21.6*  --   INR  --   --   --  1.9*  --   HEPARINUNFRC  --   --   --  >1.10*  --   CREATININE 1.09  --  1.10  --   --   TROPONINIHS 181* 183* 182*  --   --     Estimated Creatinine Clearance: 43.7 mL/min (by C-G formula based on SCr of 1.1 mg/dL).   Medical History: Past Medical History:  Diagnosis Date  . A-fib ([place])   . Cancer ([place])    skin cancers  . Coagulation defect ([place])   . Finger injury   . History of heat stroke   . Hyperlipidemia   . Hypertension   . Previous back surgery   . Stroke ([place])   . Vertigo     Medications:  PTA meds include Eliquis 2.5 mg BID. Pt iordered Lovenox 40 mg and given 1st dose 4/18 @ 2114  Assessment: Pt is 87 yo male started on heparin for med hx of afib, following CT Head Scan showed no acute sign of stroke.  4/19 1151 aPTT 99, therapeutic x 1  Goal of Therapy:  Heparin level 0.3-0.7 units/ml Monitor platelets by anticoagulation protocol: Yes  Goal aPTT 66-102   Plan:  4/19 aPTT resulted at 99 is therapeutic. Will continue with current rate of 1150 units/hr. Will check next aPTT in 8 hours. Will follow aPTT until correlation with HL. Daily CBC while on Heparin drip.  Paulina Fusi, PharmD, BCPS 01/16/2021 2:34 PM

## 2021-01-16 NOTE — Consult Note (Signed)
Consultation Note Date: 01/16/2021   Patient Name: Lee Mercado  DOB: 03/07/1925  MRN: [id number]  Age / Sex: 87 y.o., male  PCP: Danae Orleans, MD Referring Physician: Nita Sells, MD  Reason for Consultation: Establishing goals of care  HPI/Patient Profile: 87 y.o. male  with past medical history of  essential thrombocytosis, macrocytic anemia, hypothyroidism, CVA, paroxysmal A. fib on Eliquis, chronic dizziness and vertigo admitted on 01/15/2021 with shortness of breath. Being treated for suspected aspiration pneumonia.  Continues to be at risk for aspirations as well as at risk for inadequate nutrition and hydration. PMT consulted to discuss [place].  Clinical Assessment and Goals of Care: I have reviewed medical records including EPIC notes, labs and imaging, received report from Dr. Verlon Au, assessed the patient and then met with patient's son and daughter to discuss diagnosis prognosis, GOC, EOL wishes, disposition and options.  I introduced Palliative Medicine as specialized medical care for people living with serious illness. It focuses on providing relief from the symptoms and stress of a serious illness. The goal is to improve quality of life for both the patient and the family.  As far as functional and nutritional status, they tell me of a decline over the past several weeks. Patient has been getting weaker - was using lift chair and wheel chair at home; occasional ambulation with cane. Had a recent fall. His appetite was okay prior to last hospitalization but since then he has experienced a decline in appetite. They tell me of increased confusion and withdrawal since last hospitalization.    We discussed patient's current illness and what it means in the larger context of patient's on-going co-morbidities.  Natural disease trajectory and expectations at EOL were discussed. Specifically discussed his pneumonia r/t aspiration. We  discussed his decline over the past several weeks and as the rest of his body has gotten weaker so has his swallowing - setting him up for pneumonia. We discuss current dietary restrictions and how he would like to drink water but this is not considered safe. We discuss difficulty adequately hydrating on thickened liquids.   I attempted to elicit values and goals of care important to the patient.  Family express that patient would be interested in interventions to prolong life.   The difference between aggressive medical intervention and comfort care was considered in light of the patient's goals of care. Advance directives, concepts specific to code status, artificial feeding and hydration, and rehospitalization were considered and discussed.   I completed a MOST form today. The patient and family outlined their wishes for the following treatment decisions:  Cardiopulmonary Resuscitation: Do Not Attempt Resuscitation (DNR/No CPR)  Medical Interventions: Full Scope of Treatment: Use intubation, advanced airway interventions, mechanical ventilation, cardioversion as indicated, medical treatment, IV fluids, etc, also provide comfort measures. Transfer to the hospital if indicated  Antibiotics: Antibiotics if indicated  IV Fluids: IV fluids if indicated  Feeding Tube: Feeding tube for a defined trial period    Discussed with family the importance of continued conversation with family and the medical providers regarding overall plan of care and treatment options, ensuring decisions are within the context of the patient's values and GOCs.    Family would like to continue with current interventions to allow time for outcomes. They would like to see if he gets stronger/swallowing improves as his pneumonia is treated. We discussed having follow conversation in a couple of days.   Questions and concerns were addressed. The family was encouraged to call with questions or  concerns.   Primary Decision  Maker HCPOA - son Antoney Biven  SUMMARY OF RECOMMENDATIONS   - most completed as above - code status changed to limited - NO CPR, short-term intubation okay - will follow along - family would like to allow time for outcomes - would recommend palliative follow up at [facility]:  Limited code  Discharge Planning: [facility] for rehab with Palliative care service follow-up      Primary Diagnoses: Present on Admission: . Acute respiratory failure with hypoxia ([place])   I have reviewed the medical record, interviewed the patient and family, and examined the patient. The following aspects are pertinent.  Past Medical History:  Diagnosis Date  . A-fib ([place])   . Cancer ([place])    skin cancers  . Coagulation defect ([place])   . Finger injury   . History of heat stroke   . Hyperlipidemia   . Hypertension   . Previous back surgery   . Stroke ([place])   . Vertigo    Social History   Socioeconomic History  . Marital status: Married    Spouse name: Lawsen Arnott  . Number of children: 3  . Years of education: Not on file  . Highest education level: 7th grade  Occupational History  . Occupation: Retired  Tobacco Use  . Smoking status: Never Smoker  . Smokeless tobacco: Never Used  . Tobacco comment: smoking cessation materials not required  Vaping Use  . Vaping Use: Never used  Substance and Sexual Activity  . Alcohol use: No  . Drug use: No  . Sexual activity: Never  Other Topics Concern  . Not on file  Social History Narrative   Moved here to be close to children. Lived here in [place] many years ago but moved to [facility] and lived there 12 yrs. Has children that visit and call regularly.    Social Determinants of Health   Financial Resource Strain: Not on file  Food Insecurity: Not on file  Transportation Needs: Not on file  Physical Activity: Not on file  Stress: Not on file  Social Connections: Not on file   Family History  Problem  Relation Age of Onset  . Cancer Mother        breast  . Emphysema Father   . Diabetes Sister   . Paget's disease of bone Brother   . Cancer Sister        lung cancer  . Healthy Sister    Scheduled Meds: . levothyroxine  25 mcg Intravenous Daily   Continuous Infusions: . sodium chloride Stopped (01/16/21 4403)  . ampicillin-sulbactam (UNASYN) IV 3 g (01/16/21 1204)  . dextrose 5% lactated ringers with KCl 20 mEq/L 125 mL/hr at 01/16/21 1246  . heparin 1,150 Units/hr (01/16/21 0648)   PRN Meds:.sodium chloride No Known Allergies Review of Systems  Unable to perform ROS: Mental status change    Physical Exam Constitutional:      General: He is not in acute distress.    Comments: Wakes easily to voice  Pulmonary:     Effort: Pulmonary effort is normal.     Comments: Oxygen in place Skin:    General: Skin is warm and dry.  Neurological:     Mental Status: He is disoriented.     Comments: Does not understand why he is in the hospital     Vital Signs: BP (!) 144/72 (BP Location: Left Leg)   Pulse 73   Temp 98.8 F (  37.1 C)   Resp 20   Ht '5\' 11"'  (1.803 m)   Wt 81.6 kg   SpO2 100%   BMI 25.10 kg/m  Pain Scale: 0-10   Pain Score: 0-No pain   SpO2: SpO2: 100 % O2 Device:SpO2: 100 % O2 Flow Rate: .O2 Flow Rate (L/min): 3 L/min  IO: Intake/output summary:   Intake/Output Summary (Last 24 hours) at 01/16/2021 1511 Last data filed at 01/16/2021 1300 Gross per 24 hour  Intake 1308.52 ml  Output 650 ml  Net 658.52 ml    LBM:   Baseline Weight: Weight: 81.6 kg Most recent weight: Weight: 81.6 kg     Palliative Assessment/Data: PPS 20% d/t PO intake     Time Total: 80 minutes Greater than 50%  of this time was spent counseling and coordinating care related to the above assessment and plan.  Juel Burrow, DNP, AGNP-C Palliative Medicine Team [phone] Pager: [phone]

## 2021-01-16 NOTE — Progress Notes (Signed)
Initial Nutrition Assessment  DOCUMENTATION CODES:  Severe malnutrition in context of acute illness/injury  INTERVENTION:   Continue diet as ordered, advance as able per SLP recommendations  Continue Magic cup TID with meals, each supplement provides 290 kcal and 9 grams of protein  Add MVI with minerals daily  Add Nepro Shake po 1x/d, each supplement provides 425 kcal and 19 grams protein  NUTRITION DIAGNOSIS:  Severe Malnutrition related to acute illness as evidenced by energy intake < or equal to 50% for > or equal to 5 days,moderate fat depletion,percent weight loss (8.2% x 1 month).  GOAL:  Patient will meet greater than or equal to 90% of their needs  MONITOR:  PO intake,Supplement acceptance  REASON FOR ASSESSMENT:  Malnutrition Screening Tool    ASSESSMENT:  Pt admitted from his nursing facility with SOB and hypoxia. Confused on admission, no hx of dementia. Concern for aspiration pneumonia. Recently admitted 4/4-4/7 and followed by RD. PMH relevant for HLD, HTN, CVA, and atrial fibrillation.  Pt resting in bed at the time of visit, minimally interactive during visit. Wife and daughter at bedside. Daughter reports that prior to recent admissions, appetite had always been good. Noted that starting with last admission pt's intake has been poor and he has also not been taking in adequate fluids. States that pt has been complaining of food feeling like they are getting stuck for a few weeks. Muscle and fat deficits noted on exam. Noted 8.2% weight loss x 1 month (per [facility]) which is severe. SLP evaluated pt and advanced to a puree diet with nectar thick liquids. Lunch tray at bedside, minimally consumed. Pt appears to only have eaten a little of the magic cup, none of the hot items on tray. Requesting water, assisted pt with consuming nectar thick water that was present on tray. Thick secretions noted around mouth and lips. Palliative care consulting and assisting family with [place]  discussions.   Scheduled Meds: . levothyroxine  25 mcg Intravenous Daily   Continuous Infusions: . ampicillin-sulbactam (UNASYN) IV 200 mL/hr at 01/16/21 0648  . dextrose 5% lactated ringers with KCl 20 mEq/L 125 mL/hr at 01/16/21 0758   Labs reviewed:  K, 2.9  SBG ranges from 144-147 mg/dL   BUN, 26  NUTRITION - FOCUSED PHYSICAL EXAM: Flowsheet Row Most Recent Value  Orbital Region Mild depletion  Upper Arm Region No depletion  Thoracic and Lumbar Region Mild depletion  Buccal Region Mild depletion  Temple Region Moderate depletion  Clavicle Bone Region Mild depletion  Clavicle and Acromion Bone Region Moderate depletion  Scapular Bone Region Mild depletion  Dorsal Hand Moderate depletion  Patellar Region No depletion  Anterior Thigh Region No depletion  Posterior Calf Region No depletion  Edema (RD Assessment) Mild  [pitting to the BLE, worse to the left lower leg]  Hair Reviewed  Eyes Unable to assess  Mouth Reviewed  Skin Reviewed  Nails Reviewed     Diet Order:   Diet Order            DIET - DYS 1 Room service appropriate? Yes with Assist; Fluid consistency: Nectar Thick  Diet effective now                 EDUCATION NEEDS:  No education needs have been identified at this time  Skin:  Skin Assessment: Reviewed RN Assessment (redness to coccyx, skin tear to the hand)  Last BM:  4/19 per RN documentation, type 6  Height:  Ht Readings from Last  1 Encounters:  01/15/21 5\' 11"  (1.803 m)    Weight:  Wt Readings from Last 1 Encounters:  01/15/21 81.6 kg    Ideal Body Weight:  78.2 kg  BMI:  Body mass index is 25.1 kg/m.  Estimated Nutritional Needs:   Kcal:  2000-2200 kcal  Protein:  110-110g  Fluid:  >2L/d   Ranell Patrick, RD, LDN Clinical Dietitian Pager on Amion

## 2021-01-16 NOTE — Evaluation (Signed)
Occupational Therapy Evaluation Patient Details Name: Lee Mercado MRN: [id number] DOB: Nov 11, 1924 Today's Date: 01/16/2021    History of Present Illness Patient is a 87 y.o. male with medical history significant for essential thrombocytosis, macrocytic anemia, hypothyroidism, CVA, paroxysmal A. fib on Eliquis, chronic dizziness and vertigo who presented to [facility] ED from SNF due to shortness of breath, productive cough, and hypoxemia, 86% on room air. Recent hospital discharge to SNF about 2 weeks ago after admit for acute bronchitis. Patient thought to have possible aspiration pneumonia   Clinical Impression   Lee Mercado was seen for OT/PT co-evaluation this date. Prior to hospital admission, pt was recentyl d/c from hospital to [facility]. Pt lives with family, currently at [employer]. Pt presents to acute OT demonstrating impaired ADL performance and functional mobility 2/2 decreased activity tolerance, poor insight into deficits, and functional strength/ROM/balance deficits. Pt currently requires MOD A wiping face and managing secretions at bed level. MAX A x2 don B socks at bed level. CGA static sitting EOB, unable to progress to seated ADLs 2/2 poor dynamic sitting balance. Pt would benefit from skilled OT to address noted impairments and functional limitations (see below for any additional details) in order to maximize safety and independence while minimizing falls risk and caregiver burden. Upon hospital discharge, recommend STR to maximize pt safety and return to PLOF.     Follow Up Recommendations  SNF    Equipment Recommendations  Other (comment) (defer)    Recommendations for Other Services       Precautions / Restrictions Precautions Precautions: Fall Restrictions Weight Bearing Restrictions: No      Mobility Bed Mobility Overal bed mobility: Needs Assistance Bed Mobility: Supine to Sit;Sit to Supine     Supine to sit: Mod assist;+2 for physical assistance;HOB elevated Sit  to supine: Max assist;+2 for physical assistance   General bed mobility comments: assistance for LE and trunk support. verbal cues for task initiation and sequencing    Transfers                 General transfer comment: transfers not attempted due to poor sitting tolerance and fatigue with activity. patient also reports dizziness in sitting position. Sp02 95% on 3 L02 and heart rate in the 70's    Balance Overall balance assessment: Needs assistance Sitting-balance support: Feet supported;Bilateral upper extremity supported Sitting balance-Leahy Scale: Fair Sitting balance - Comments: poor balance initially progressing to fair with increased sitting time. close stand by assistance required for safety                                   ADL either performed or assessed with clinical judgement   ADL Overall ADL's : Needs assistance/impaired                                       General ADL Comments: MOD A wiping face and managing secretions at bed level. MAX A x2 don B socks at bed level. CGA static sitting EOB, unable to progress to seated ADLs 2/2 poor dynamic sitting balance                  Pertinent Vitals/Pain Pain Assessment: Faces Faces Pain Scale: Hurts little more Pain Location: bilateral LE with movement Pain Descriptors / Indicators: Grimacing Pain Intervention(s): Limited activity within patient's tolerance;Repositioned  Hand Dominance Right   Extremity/Trunk Assessment Upper Extremity Assessment Upper Extremity Assessment: Generalized weakness   Lower Extremity Assessment Lower Extremity Assessment: Generalized weakness       Communication Communication Communication: No difficulties   Cognition Arousal/Alertness: Awake/alert Behavior During Therapy: WFL for tasks assessed/performed Overall Cognitive Status: No family/caregiver present to determine baseline cognitive functioning Area of Impairment:  Orientation;Following commands;Problem solving;Safety/judgement                 Orientation Level: Disoriented to;Time;Situation;Place     Following Commands: Follows one step commands consistently Safety/Judgement: Decreased awareness of safety;Decreased awareness of deficits   Problem Solving: Requires verbal cues;Requires tactile cues General Comments: incresed time for motor planning   General Comments       Exercises Exercises: General Lower Extremity;Other exercises  Other Exercises Other Exercises: verbal and tactile cues for exercise technique for strenghening BLE Other Exercises: Pt educated re: OT role, NPO status, falls prevention Other Exercises: LBD< sup<>sit, sitting balance/tolernace, oral hygoene   Shoulder Instructions      Home Living Family/patient expects to be discharged to:: Skilled nursing facility                                 Additional Comments: Previous ALF resident recently d/c from hospital to [facility]      Prior Functioning/Environment Level of Independence: Needs assistance  Gait / Transfers Assistance Needed: Pt ambulates with walker/cane ADL's / Homemaking Assistance Needed: Pt was receiving assist for LB dressing and seated bathing from personal care aide (4 hours/day). Personal care aide and children assist with IADLs   Comments: information from chart review        OT Problem List: Decreased strength;Decreased activity tolerance;Impaired balance (sitting and/or standing);Decreased range of motion;Decreased cognition      OT Treatment/Interventions: Self-care/ADL training;Therapeutic exercise;Energy conservation;DME and/or AE instruction;Therapeutic activities;Patient/family education;Balance training    OT Goals(Current goals can be found in the care plan section) Acute Rehab OT Goals Patient Stated Goal: to be able to drink water OT Goal Formulation: With patient Time For Goal Achievement: 01/30/21 Potential to  Achieve Goals: Fair ADL Goals Pt Will Perform Grooming: with min guard assist;bed level Pt Will Perform Upper Body Bathing: with mod assist;sitting Pt Will Transfer to Toilet: with mod assist (rolling at bed level)  OT Frequency: Min 1X/week           Co-evaluation PT/OT/SLP Co-Evaluation/Treatment: Yes Reason for Co-Treatment: For patient/therapist safety;To address functional/ADL transfers PT goals addressed during session: Mobility/safety with mobility OT goals addressed during session: ADL's and self-care      AM-PAC OT "6 Clicks" Daily Activity     Outcome Measure Help from another person eating meals?: A Lot Help from another person taking care of personal grooming?: A Lot Help from another person toileting, which includes using toliet, bedpan, or urinal?: Total Help from another person bathing (including washing, rinsing, drying)?: A Lot Help from another person to put on and taking off regular upper body clothing?: A Lot Help from another person to put on and taking off regular lower body clothing?: Total 6 Click Score: 10   End of Session Nurse Communication: Mobility status  Activity Tolerance: Patient tolerated treatment well Patient left: in bed;with call bell/phone within reach;with bed alarm set  OT Visit Diagnosis: Unsteadiness on feet (R26.81);Muscle weakness (generalized) (M62.81);History of falling (Z91.81)  Time: 9532-0233 OT Time Calculation (min): 29 min Charges:  OT General Charges $OT Visit: 1 Visit OT Evaluation $OT Eval Moderate Complexity: 1 Mod OT Treatments $Self Care/Home Management : 8-22 mins   Dessie Coma, M.S. OTR/L  01/16/21, 12:15 PM  ascom [phone]

## 2021-01-16 NOTE — Evaluation (Signed)
Clinical/Bedside Swallow Evaluation Patient Details  Name: Lee Mercado MRN: [id number] Date of Birth: 1924-10-09  Today's Date: 01/16/2021 Time: SLP Start Time (ACUTE ONLY): 0935 SLP Stop Time (ACUTE ONLY): 1030 SLP Time Calculation (min) (ACUTE ONLY): 55 min  Past Medical History:  Past Medical History:  Diagnosis Date  . A-fib ([place])   . Cancer ([place])    skin cancers  . Coagulation defect ([place])   . Finger injury   . History of heat stroke   . Hyperlipidemia   . Hypertension   . Previous back surgery   . Stroke ([place])   . Vertigo    Past Surgical History:  Past Surgical History:  Procedure Laterality Date  . BACK SURGERY  2003  . FINGER DEBRIDEMENT  1963   Left hand 4 digit   . REPLACEMENT TOTAL KNEE Right 2006   HPI:  Patient is a 87 y.o. male with medical history significant for essential thrombocytosis, macrocytic anemia, hypothyroidism, CVA, paroxysmal A. fib on Eliquis, chronic dizziness and vertigo who presented to [facility] ED from SNF due to shortness of breath, productive cough, and hypoxemia, 86% on room air. Recent hospital discharge to SNF about 2 weeks ago after admit for acute bronchitis. Patient thought to have possible aspiration pneumonia. Chest x-ray was unremarkable although concern over possible aspiration due to failing a bedside swallowing study.  He was made n.p.o. due to this and placed on empiric antibiotics.  Head CT revealed no acute intracranial abnormality.  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  Chest x-ray revealed low lung volumes with vascular crowding but no obvious pulmonary edema.  OF NOTE, pt endorses "some" trouble swallowing and controlling own saliva for "~1 year" not s/p a "procedure" addressing "skin cancer on my neck". Pt stated to having had a "surgery" and "treatment" on his neck but was unclear if he had Radiation tx. This was related to the MD covering 01/16/2021.   Assessment / Plan /  Recommendation Clinical Impression  Pt appears to present w/ oropharyngeal phase dysphagia w/ oropharyngeal phase deficits and suspected Neuromuscular impact during swallowing; also noted general weakness and fatigue w/ any exertion impacting his overall performance during po's. such weakness/fatigue during swallowing tasks increases risk for aspiration. Pt required min-mod verbal/visual cues during po tasks and MOD+ support w/ feeding during po trials. Pt was positioned Upright for assessment, po trials. Oral care required d/t increased saliva/secretions held orally. Pt stated Baseline difficulty managing his own saliva for ~1 year now. He then consumed trials of singe ice chips, thin and Nectar liquids via Cup, and purees fed to him w/ hand over hand support during Cup holding when drinking. Delayed, overt clinical s/s of aspiration noted w/ trials of thin liquids(coughing) as was noted by NSG during swallow screen at admit. W/ trials of Nectar liquids and foods, no cough and no decline in respiratory presentation noted during/post trials. Suspect delayed pharyngeal swallowin initiation w/ thin liquids d/t viscosity, and generalized weakness could have impacted safety and success of pharyngeal swallow w/ thin liquids. Pt endorsed this has been ongoing prior to admit. Oral phase was c/b slow, deliberate bolus management and oral clearing of all boluses given. He required increased Time for bolus manipulation and A-P transfer w/ all trials except liquids. He appeared to orally manage Puree consistency foods more timely; no trials of solids were attempted this eval. OM exam revealed slight unilateral weakness, but overall lingual and oral weakness was noted.  Recommend dysphagia level 1  diet(PUREE) w/ Nectar consistency liquids; aspiration precautions; Pills Crushed in puree for safety; feeding support and supervision at meals, reduce Distractions during meals and check for oral clearing during/post intake. NSG/MD  updated. Palliative Care updated -- meeting for [place] today w/ family. Stated pt's wishes to this SLP during evaluation that he "just want water" to drink. SLP Visit Diagnosis: Dysphagia, oropharyngeal phase (R13.12)    Aspiration Risk  Moderate aspiration risk;Risk for inadequate nutrition/hydration    Diet Recommendation  Dysphagia level 1 (PUREE) w/ NECTAR liquids VIA CUP.  No Straws.  Aspiration precautions.  Feeding support at all meals.  Medication Administration: Crushed with puree (for safer, easier swallowing)    Other  Recommendations Recommended Consults:  (Palliative Care; Dietician) Oral Care Recommendations: Oral care BID;Oral care before and after PO;Staff/trained caregiver to provide oral care Other Recommendations: Order thickener from pharmacy;Prohibited food (jello, ice cream, thin soups);Remove water pitcher;Have oral suction available   Follow up Recommendations Skilled Nursing facility (TBD)      Frequency and Duration min 2x/week  2 weeks       Prognosis Prognosis for Safe Diet Advancement: Guarded (-Fair) Barriers to Reach Goals: Time post onset;Severity of deficits Barriers/Prognosis Comment: pt wants "water"      Swallow Study   General Date of Onset: 01/15/21 HPI: Patient is a 87 y.o. male with medical history significant for essential thrombocytosis, macrocytic anemia, hypothyroidism, CVA, paroxysmal A. fib on Eliquis, chronic dizziness and vertigo who presented to [facility] ED from SNF due to shortness of breath, productive cough, and hypoxemia, 86% on room air. Recent hospital discharge to SNF about 2 weeks ago after admit for acute bronchitis. Patient thought to have possible aspiration pneumonia. Chest x-ray was unremarkable although concern over possible aspiration due to failing a bedside swallowing study.  He was made n.p.o. due to this and placed on empiric antibiotics.  Head CT revealed no acute intracranial abnormality.  Chronic microvascular ischemia and  generalized atrophy with multiple old cerebellar infarcts.  Chest x-ray revealed low lung volumes with vascular crowding but no obvious pulmonary edema.  OF NOTE, pt endorses "some" trouble swallowing and controlling own saliva for "~1 year" not s/p a "procedure" addressing "skin cancer on my neck". Pt stated to having had a "surgery" and "treatment" on his neck but was unclear if he had Radiation tx. This was related to the MD covering 01/16/2021. Type of Study: Bedside Swallow Evaluation Previous Swallow Assessment: none Diet Prior to this Study: NPO (regular prior per pt) Temperature Spikes Noted: No (wbc 15.1 declining) Respiratory Status: Nasal cannula (2-3L) History of Recent Intubation: No Behavior/Cognition: Alert;Cooperative;Pleasant mood;Distractible;Requires cueing (min) Oral Cavity Assessment: Excessive secretions (this was cleared w/ oral care) Oral Care Completed by SLP: Yes Oral Cavity - Dentition: Adequate natural dentition Vision: Functional for self-feeding Self-Feeding Abilities: Able to feed self;Needs assist;Needs set up;Total assist (pt held cup) Patient Positioning: Upright in bed (needed positioning) Baseline Vocal Quality: Low vocal intensity Volitional Cough: Strong;Congested Volitional Swallow: Able to elicit    Oral/Motor/Sensory Function Overall Oral Motor/Sensory Function: Generalized oral weakness Facial ROM: Within Functional Limits Facial Symmetry: Within Functional Limits Lingual ROM: Within Functional Limits Lingual Symmetry: Abnormal symmetry right (slight R deviation (old?)) Lingual Strength: Reduced (overall) Lingual Sensation: Within Functional Limits Velum: Within Functional Limits Mandible: Within Functional Limits   Ice Chips Ice chips: Within functional limits Presentation: Spoon (fed; 10+ trials)   Thin Liquid Thin Liquid: Impaired Presentation: Cup;Self Fed (supported; 9-10 small sips) Oral Phase Impairments: Poor awareness of  bolus  (min) Pharyngeal  Phase Impairments: Cough - Delayed;Suspected delayed Swallow (at end of/break from trials)    Nectar Thick Nectar Thick Liquid: Within functional limits Presentation: Cup;Self Fed (~2 ozs) Other Comments: small sips   Honey Thick Honey Thick Liquid: Not tested   Puree Puree: Within functional limits Presentation: Spoon (fed; 8-9 trials)   Solid     Solid: Not tested Other Comments: overall weakness        [name], MS, CCC-SLP Speech Language Pathologist Rehab Services [phone] Noreen Mackintosh 01/16/2021,2:58 PM

## 2021-01-16 NOTE — Progress Notes (Signed)
ANTICOAGULATION CONSULT NOTE   Pharmacy Consult for heparin infusion Indication: atrial fibrillation  No Known Allergies  Patient Measurements: Height: 5\' 11"  (180.3 cm) Weight: 81.6 kg (180 lb) IBW/kg (Calculated) : 75.3 Heparin Dosing Weight: 81.6 kg  Vital Signs: Temp: 98.7 F (37.1 C) (04/19 1945) Temp Source: Oral (04/19 1703) BP: 126/64 (04/19 1945) Pulse Rate: 85 (04/19 1945)  Labs: Recent Labs    01/15/21 1215 01/15/21 2304 01/16/21 0107 01/16/21 0349 01/16/21 0349 01/16/21 1151 01/16/21 2002 01/16/21 2053  HGB 11.5*  --  10.1*  --   --   --   --   --   HCT 32.6*  --  28.5*  --   --   --   --   --   PLT 427*  --  374  --   --   --   --   --   APTT  --   --   --  50*   < > 99* 140* 120*  LABPROT  --   --   --  21.6*  --   --   --   --   INR  --   --   --  1.9*  --   --   --   --   HEPARINUNFRC  --   --   --  >1.10*  --   --   --   --   CREATININE 1.09  --  1.10  --   --   --   --   --   TROPONINIHS 181* 183* 182*  --   --   --   --   --    < > = values in this interval not displayed.    Estimated Creatinine Clearance: 43.7 mL/min (by C-G formula based on SCr of 1.1 mg/dL).   Medical History: Past Medical History:  Diagnosis Date  . A-fib ([place])   . Cancer ([place])    skin cancers  . Coagulation defect ([place])   . Finger injury   . History of heat stroke   . Hyperlipidemia   . Hypertension   . Previous back surgery   . Stroke ([place])   . Vertigo     Medications:  PTA meds include Eliquis 2.5 mg BID. Pt iordered Lovenox 40 mg and given 1st dose 4/18 @ 2114  Assessment: Pt is 87 yo male started on heparin for med hx of afib, following CT Head Scan showed no acute sign of stroke.  4/19 1151 aPTT 99, therapeutic x 1 4/19 2002 aPTT 140, supratherapeutic without dose/rate adjustment, nurse notified to hold and stat redraw ordered 4/19 2053 aPTT 120 supratherapeutic   Goal of Therapy:  Heparin level 0.3-0.7 units/ml Monitor platelets by  anticoagulation protocol: Yes  Goal aPTT 66-102   Plan:  APTT remain supratherapeutic with recollect. Will decrease infusion rate to 950 units/hr Will check next aPTT in 8 hours following rate change. Will follow aPTT until correlation with HL. Daily CBC while on Heparin drip.  Dorothe Pea, PharmD, BCPS 01/16/2021 10:08 PM

## 2021-01-16 NOTE — Evaluation (Signed)
Physical Therapy Evaluation Patient Details Name: Lee Mercado MRN: [id number] DOB: 23-Sep-1925 Today's Date: 01/16/2021   History of Present Illness  Patient is a 87 y.o. male with medical history significant for essential thrombocytosis, macrocytic anemia, hypothyroidism, CVA, paroxysmal A. fib on Eliquis, chronic dizziness and vertigo who presented to [facility] ED from SNF due to shortness of breath, productive cough, and hypoxemia, 86% on room air. Recent hospital discharge to SNF about 2 weeks ago after admit for acute bronchitis. Patient thought to have possible aspiration pneumonia per chart.    Clinical Impression  Patient lethargic initially but is alert after verbal and tactile stimulation. Patient is able to follow single step commands consistently with extra time required for motor planning. Patient has productive cough and lots of secretions during session (patient asking to spit multiple times). Sp02 remains 94% or higher on 3 L02 with mobility with heart rate in the 70's and 80's. Patient required +2 person assistance for bed mobility due to generalized weakness. Patient needs assistance initially to maintain sitting balance, progressing to sitting without physical support for ~ 5 minutes. Patient does report dizziness with sitting upright. Unable to progress standing due to fatigue with activity. Recommend PT to maximize independence and facilitate return to prior level of function. SNF recommended at discharge.     Follow Up Recommendations SNF    Equipment Recommendations  None recommended by PT (to be determined at next level of care)    Recommendations for Other Services       Precautions / Restrictions Precautions Precautions: Fall Restrictions Weight Bearing Restrictions: No      Mobility  Bed Mobility Overal bed mobility: Needs Assistance Bed Mobility: Supine to Sit;Sit to Supine     Supine to sit: Mod assist;+2 for physical assistance;HOB elevated Sit to supine:  Max assist;+2 for physical assistance   General bed mobility comments: assistance for LE and trunk support. verbal cues for task initiation and sequencing    Transfers                 General transfer comment: transfers not attempted due to poor sitting tolerance and fatigue with activity. patient also reports dizziness in sitting position. Sp02 95% on 3 L02 and heart rate in the 70's  Ambulation/Gait                Stairs            Wheelchair Mobility    Modified Rankin (Stroke Patients Only)       Balance Overall balance assessment: Needs assistance Sitting-balance support: Feet supported;Bilateral upper extremity supported Sitting balance-Leahy Scale: Fair Sitting balance - Comments: poor balance initially progressing to fair with increased sitting time. close stand by assistance required for safety                                     Pertinent Vitals/Pain Pain Assessment: Faces Faces Pain Scale: Hurts little more Pain Location: bilateral LE with movement Pain Descriptors / Indicators: Grimacing Pain Intervention(s): Limited activity within patient's tolerance;Repositioned    Home Living Family/patient expects to be discharged to:: Skilled nursing facility                 Additional Comments: Previous ALF resident recently d/c from hospital to [facility]    Prior Function Level of Independence: Needs assistance   Gait / Transfers Assistance Needed: Pt ambulates with walker/cane  ADL's / Homemaking  Assistance Needed: Pt was receiving assist for LB dressing and seated bathing from personal care aide (4 hours/day). Personal care aide and children assist with IADLs  Comments: information from chart review     Hand Dominance   Dominant Hand: Right    Extremity/Trunk Assessment   Upper Extremity Assessment Upper Extremity Assessment: Generalized weakness    Lower Extremity Assessment Lower Extremity Assessment: Generalized  weakness       Communication   Communication: No difficulties  Cognition Arousal/Alertness: Awake/alert Behavior During Therapy: WFL for tasks assessed/performed Overall Cognitive Status: No family/caregiver present to determine baseline cognitive functioning Area of Impairment: Orientation;Following commands;Problem solving;Safety/judgement                 Orientation Level: Disoriented to;Time;Situation;Place     Following Commands: Follows one step commands consistently Safety/Judgement: Decreased awareness of safety;Decreased awareness of deficits   Problem Solving: Requires verbal cues;Requires tactile cues General Comments: incresed time for motor planning      General Comments      Exercises General Exercises - Lower Extremity Ankle Circles/Pumps: AAROM;Strengthening;Both;10 reps;Supine Hip ABduction/ADduction: AAROM;Strengthening;Both;10 reps;Supine Other Exercises Other Exercises: verbal and tactile cues for exercise technique for strenghening BLE   Assessment/Plan    PT Assessment Patient needs continued PT services  PT Problem List Decreased strength;Decreased range of motion;Decreased activity tolerance;Decreased balance;Decreased mobility;Decreased cognition;Decreased safety awareness;Decreased knowledge of precautions       PT Treatment Interventions DME instruction;Gait training;Functional mobility training;Therapeutic activities;Therapeutic exercise;Balance training;Neuromuscular re-education;Cognitive remediation;Patient/family education    PT Goals (Current goals can be found in the Care Plan section)  Acute Rehab PT Goals Patient Stated Goal: to be able to drink water PT Goal Formulation: With patient Time For Goal Achievement: 01/30/21 Potential to Achieve Goals: Fair    Frequency Min 2X/week   Barriers to discharge        Co-evaluation PT/OT/SLP Co-Evaluation/Treatment: Yes Reason for Co-Treatment: For patient/therapist safety;To  address functional/ADL transfers PT goals addressed during session: Mobility/safety with mobility OT goals addressed during session: ADL's and self-care       AM-PAC PT "6 Clicks" Mobility  Outcome Measure Help needed turning from your back to your side while in a flat bed without using bedrails?: Total Help needed moving from lying on your back to sitting on the side of a flat bed without using bedrails?: Total Help needed moving to and from a bed to a chair (including a wheelchair)?: Total Help needed standing up from a chair using your arms (e.g., wheelchair or bedside chair)?: Total Help needed to walk in hospital room?: Total Help needed climbing 3-5 steps with a railing? : Total 6 Click Score: 6    End of Session Equipment Utilized During Treatment: Oxygen Activity Tolerance: Patient limited by fatigue Patient left: in bed;with call bell/phone within reach;with bed alarm set Nurse Communication: Mobility status (via white board updated with mobility status) PT Visit Diagnosis: Unsteadiness on feet (R26.81);Muscle weakness (generalized) (M62.81);Difficulty in walking, not elsewhere classified (R26.2)    Time: 0737-1062 PT Time Calculation (min) (ACUTE ONLY): 29 min   Charges:   PT Evaluation $PT Eval Moderate Complexity: 1 Mod PT Treatments $Therapeutic Activity: 8-22 mins        Minna Merritts, PT, MPT   Percell Locus 01/16/2021, 10:13 AM

## 2021-01-16 NOTE — Consult Note (Signed)
Cardiology Consultation Note    Patient ID: Lee Mercado, MRN: [id number], DOB/AGE: [id number] 85 y.o. Admit date: 01/15/2021   Date of Consult: 01/16/2021 Primary Physician: Danae Orleans, MD Primary Cardiologist: none  Chief Complaint: confusion Reason for Consultation: afib/troponin Requesting MD: Dr. Verlon Au  HPI: Lee Mercado is a 87 y.o. male with history of essential thrombocytosis, microcytic anemia, paroxysmal atrial fibrillation on 2.5 mg of Eliquis chronically, chronic dizziness and vertigo, history of a CVA, who presented to [facility] from a skilled nursing facility due to increased shortness of breath cough and hypoxemia with a pulse ox of 86% on room air.  Patient is a difficult historian.  Was recently hospitalized at [facility] several weeks ago where he was admitted for acute on chronic bronchitis and was discharged to skilled nursing facility.  Per chart, he denied chest pain.  Chest x-ray was unremarkable although concern over possible aspiration due to failing a bedside swallowing study.  He was made n.p.o. due to this and placed on empiric antibiotics.  He was taken off of Eliquis and placed on heparin for his atrial fibrillation due to concern over aspiration.  Cardiology consultation requested due to A. fib and elevated troponin.  Troponins were drawn which have been flat at 181/183/182.  Patient serum potassium was 3.8 on admission 2.9 at present.  Serum creatinine of 36.72-year-old 9-1.1 which appears to be at his baseline.  White count is 15.1 hemoglobin 10.1 which is his baseline.  His platelet count is 374K.  EKG shows sinus arrhythmia at a rate of 75 with right bundle branch block.  Due to confusion head CT was done to rule out hemorrhagic stroke.  This revealed no acute intracranial abnormality.  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  Chest x-ray revealed low lung volumes with vascular crowding but no obvious pulmonary  edema.  Past Medical History:  Diagnosis Date  . A-fib ([place])   . Cancer ([place])    skin cancers  . Coagulation defect ([place])   . Finger injury   . History of heat stroke   . Hyperlipidemia   . Hypertension   . Previous back surgery   . Stroke ([place])   . Vertigo       Surgical History:  Past Surgical History:  Procedure Laterality Date  . BACK SURGERY  2003  . FINGER DEBRIDEMENT  1963   Left hand 4 digit   . REPLACEMENT TOTAL KNEE Right 2006     Home Meds: Prior to Admission medications   Medication Sig Start Date End Date Taking? Authorizing Provider  acetaminophen (TYLENOL) 500 MG tablet Take 500 mg by mouth every 6 (six) hours as needed for mild pain.   Yes [provider]  Cholecalciferol (VITAMIN D) 2000 units CAPS Take 1 capsule (2,000 Units total) by mouth daily. 12/18/17  Yes Plonk, Gwyndolyn Saxon, MD  cyanocobalamin 1000 MCG tablet Take 1 tablet (1,000 mcg total) by mouth daily. 03/24/18  Yes Juline Patch, MD  dextromethorphan-guaiFENesin [facility] DM) 30-600 MG 12hr tablet Take 1 tablet by mouth 2 (two) times daily. 01/10/21 01/24/21 Yes [provider]  diclofenac sodium (VOLTAREN) 1 % GEL Apply topically 4 (four) times daily as needed (pain).   Yes [provider]  ELIQUIS 2.5 MG TABS tablet Take 2.5 mg by mouth 2 (two) times daily. 12/26/20  Yes [provider]  furosemide (LASIX) 20 MG tablet Take 20 mg by mouth daily.   Yes [provider]  hydroxyurea (HYDREA)  500 MG capsule Take 1 capsule (500 mg total) by mouth daily. May take with food to minimize GI side effects. 03/24/18  Yes Juline Patch, MD  levothyroxine (SYNTHROID) 50 MCG tablet Take 50 mcg by mouth daily. 12/26/20  Yes [provider]  meclizine (ANTIVERT) 25 MG tablet Take 25 mg by mouth 2 (two) times daily.   Yes [provider]  metoprolol tartrate (LOPRESSOR) 25 MG tablet Take 0.5 tablets (12.5 mg total) by mouth 2 (two) times daily. 03/24/18  Yes  Juline Patch, MD  Multiple Vitamin (MULTIVITAMIN) tablet Take 1 tablet daily by mouth.   Yes [provider]  potassium chloride SA (KLOR-CON) 20 MEQ tablet Take 20 mEq by mouth daily.   Yes [provider]  pravastatin (PRAVACHOL) 20 MG tablet TAKE 1 TABLET BY MOUTH DAILY Patient taking differently: Take 20 mg by mouth daily at 6 PM. 12/18/18  Yes Juline Patch, MD    Inpatient Medications:  . levothyroxine  25 mcg Intravenous Daily   . sodium chloride Stopped (01/16/21 5176)  . ampicillin-sulbactam (UNASYN) IV 200 mL/hr at 01/16/21 0648  . dextrose 5% lactated ringers with KCl 20 mEq/L Stopped (01/16/21 1607)  . heparin 1,150 Units/hr (01/16/21 3710)    Allergies: No Known Allergies  Social History   Socioeconomic History  . Marital status: Married    Spouse name: Encarnacion Scioneaux  . Number of children: 3  . Years of education: Not on file  . Highest education level: 7th grade  Occupational History  . Occupation: Retired  Tobacco Use  . Smoking status: Never Smoker  . Smokeless tobacco: Never Used  . Tobacco comment: smoking cessation materials not required  Vaping Use  . Vaping Use: Never used  Substance and Sexual Activity  . Alcohol use: No  . Drug use: No  . Sexual activity: Never  Other Topics Concern  . Not on file  Social History Narrative   Moved here to be close to children. Lived here in [place] many years ago but moved to [facility] and lived there 12 yrs. Has children that visit and call regularly.    Social Determinants of Health   Financial Resource Strain: Not on file  Food Insecurity: Not on file  Transportation Needs: Not on file  Physical Activity: Not on file  Stress: Not on file  Social Connections: Not on file  Intimate Partner Violence: Not on file     Family History  Problem Relation Age of Onset  . Cancer Mother        breast  . Emphysema Father   . Diabetes Sister   . Paget's disease of bone Brother   . Cancer Sister         lung cancer  . Healthy Sister      Review of Systems: A 12-system review of systems was performed and is negative except as noted in the HPI.  Labs: No results for input(s): CKTOTAL, CKMB, TROPONINI in the last 72 hours. Lab Results  Component Value Date   WBC 15.1 (H) 01/16/2021   HGB 10.1 (L) 01/16/2021   HCT 28.5 (L) 01/16/2021   MCV 128.4 (H) 01/16/2021   PLT 374 01/16/2021    Recent Labs  Lab 01/16/21 0107  NA 142  K 2.9*  CL 103  CO2 29  BUN 26*  CREATININE 1.10  CALCIUM 8.2*  PROT 5.8*  BILITOT 0.9  ALKPHOS 86  ALT 14  AST 18  GLUCOSE 144*   Lab  Results  Component Value Date   CHOL 139 03/24/2018   HDL 45 03/24/2018   LDLCALC 73 03/24/2018   TRIG 104 03/24/2018   No results found for: DDIMER  Radiology/Studies:  DG Chest 1 View  Result Date: 01/01/2021 CLINICAL DATA:  87 year old male status post fall EXAM: CHEST  1 VIEW COMPARISON:  None. FINDINGS: Very low inspiratory volumes. Cardiac and mediastinal contours are within normal limits. Mild interstitial prominence and bronchial wall thickening are favored to be chronic in nature. Gaseous distension of the colon and small bowel. Left basilar patchy airspace opacity is nonspecific. No acute osseous abnormality. IMPRESSION: 1. Very low inspiratory volumes with probable left basilar atelectasis versus scarring. 2. Probable chronic bronchitic changes and interstitial prominence. 3. Mild gaseous distension of colon and small bowel in the visualized upper abdomen. 4. No acute fracture visualized. Electronically Signed   By: Jacqulynn Cadet M.D.   On: 01/01/2021 07:48   CT HEAD WO CONTRAST  Result Date: 01/16/2021 CLINICAL DATA:  Delirium EXAM: CT HEAD WITHOUT CONTRAST TECHNIQUE: Contiguous axial images were obtained from the base of the skull through the vertex without intravenous contrast. COMPARISON:  None. FINDINGS: Brain: There is no mass, hemorrhage or extra-axial collection. There is generalized atrophy  without lobar predilection. Hypodensity of the white matter is most commonly associated with chronic microvascular disease. There are multiple old cerebellar infarcts. Vascular: No abnormal hyperdensity of the major intracranial arteries or dural venous sinuses. No intracranial atherosclerosis. Skull: The visualized skull base, calvarium and extracranial soft tissues are normal. Sinuses/Orbits: No fluid levels or advanced mucosal thickening of the visualized paranasal sinuses. No mastoid or middle ear effusion. The orbits are normal. IMPRESSION: 1. No acute intracranial abnormality. 2. Chronic microvascular ischemia and generalized atrophy. 3. Multiple old cerebellar infarcts. Electronically Signed   By: Ulyses Jarred M.D.   On: 01/16/2021 02:55   CT Head Wo Contrast  Result Date: 01/01/2021 CLINICAL DATA:  Recent falls EXAM: CT HEAD WITHOUT CONTRAST TECHNIQUE: Contiguous axial images were obtained from the base of the skull through the vertex without intravenous contrast. COMPARISON:  Head CT November 11, 2017 and brain MRI November 12, 2017 FINDINGS: Brain: Mild diffuse atrophy is stable. There is no intracranial mass, hemorrhage, extra-axial fluid collection, or midline shift. 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 There is small vessel disease in the centra semiovale bilaterally, stable. No acute appearing infarct is evident on this study. Vascular: No hyperdense vessel. There is calcification in each carotid siphon region. Skull: Bony calvarium appears intact. Sinuses/Orbits: There is a small retention cyst in mid left ethmoid region. Other visualized paranasal sinuses are clear. Apparent prior cataract removal on the left. Orbits otherwise appear symmetric bilaterally. Other: Visualized mastoid air cells are clear. 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 No acute infarct  evident. No mass or hemorrhage. There are foci of arterial vascular calcification. Small retention cyst in a mid left ethmoid air cell. Electronically Signed   By: Lowella Grip III M.D.   On: 01/01/2021 08:58   CT Chest Wo Contrast  Result Date: 01/01/2021 CLINICAL DATA:  Pneumonia, pleural effusion or abscess suspected. EXAM: CT CHEST WITHOUT CONTRAST TECHNIQUE: Multidetector CT imaging of the chest was performed following the standard protocol without IV contrast. COMPARISON:  Chest x-ray obtained earlier today FINDINGS: Cardiovascular: Limited evaluation in the absence of intravenous contrast. No evidence of aneurysm. Scattered heterogeneous atherosclerotic plaque. Small penetrating atherosclerotic ulceration  in the region of the ligamentum arteriosum. The maximal aortic diameter at this location is 3.7 cm which is nonaneurysmal. Calcifications present throughout the coronary arteries. The heart is normal in size. No pericardial effusion. Mediastinum/Nodes: Unremarkable CT appearance of the thyroid gland. No suspicious mediastinal or hilar adenopathy. No soft tissue mediastinal mass. The thoracic esophagus is unremarkable. Lungs/Pleura: Nonspecific 0.8 mm pulmonary nodule in the right lung apex which may represent a focus of pleuroparenchymal scarring. Diffuse mild bronchial wall thickening. Minimal dependent atelectasis in the lower lungs. No focal infiltrate, pleural effusion or pneumothorax. Upper Abdomen: No acute abnormality. Musculoskeletal: T5 compression fracture with approximately 50% height loss appears chronic. No focal lucency. Compression deformity of the inferior endplate of T9 with less than 20% height loss also appears chronic in nature. No acute osseous abnormality. IMPRESSION: 1. Low inspiratory volumes with mild bibasilar atelectasis. 2. Diffuse bilateral lower lobe bronchial wall thickening is favored to be chronic in nature. Acute on chronic bronchitis is difficult to exclude by  imaging alone. 3. 8 mm pulmonary nodule in the right lung apex. Non-contrast chest CT at 6-12 months is recommended. If the nodule is stable at time of repeat CT, then future CT at 18-24 months (from today's scan) is considered optional for low-risk patients, but is recommended for high-risk patients. This recommendation follows the consensus statement: Guidelines for Management of Incidental Pulmonary Nodules Detected on CT Images: From the Fleischner Society 2017; Radiology 2017; 284:228-243. 4. Aortic atherosclerosis with a 2.0 x 1.3 cm penetrating atherosclerotic ulceration at the aortic isthmus. Aortic Atherosclerosis (ICD10-I70.0). 5. T5 and T9 compression fractures are favored to be chronic in nature. Electronically Signed   By: Jacqulynn Cadet M.D.   On: 01/01/2021 08:58   DG Chest Port 1 View  Result Date: 01/15/2021 CLINICAL DATA:  Shortness of breath and dizziness. EXAM: PORTABLE CHEST 1 VIEW COMPARISON:  01/01/2021 FINDINGS: The cardiac silhouette, mediastinal and hilar contours are within normal limits and stable. The lungs are clear of an acute process. Low lung volumes with vascular crowding and streaky basilar atelectasis but no infiltrates, edema or effusions. The bony thorax is intact. IMPRESSION: Low lung volumes with vascular crowding and streaky basilar atelectasis. Electronically Signed   By: Marijo Sanes M.D.   On: 01/15/2021 12:45    Wt Readings from Last 3 Encounters:  01/15/21 81.6 kg  01/01/21 86 kg  04/21/18 86.6 kg    EKG: nsr wit rbbb.   Physical Exam:elderly male in no acute distress Blood pressure (!) 144/72, pulse 73, temperature 98.8 F (37.1 C), resp. rate 20, height 5\' 11"  (1.803 m), weight 81.6 kg, SpO2 100 %. Body mass index is 25.1 kg/m. General: Well developed, well nourished, in no acute distress. Head: Normocephalic, atraumatic, sclera non-icteric, no xanthomas, nares are without discharge.  Neck: Negative for carotid bruits. JVD not elevated. Lungs:  Clear bilaterally to auscultation without wheezes, rales, or rhonchi. Breathing is unlabored. Heart: RRR with S1 S2. No murmurs, rubs, or gallops appreciated. Abdomen: Soft, non-tender, non-distended with normoactive bowel sounds. No hepatomegaly. No rebound/guarding. No obvious abdominal masses. Msk:  Strength and tone appear normal for age. Extremities: No clubbing or cyanosis. No edema.  Distal pedal pulses are 2+ and equal bilaterally. Neuro: Alert and oriented X 3. No facial asymmetry. No focal deficit. Moves all extremities spontaneously. Psych:  Responds to questions appropriately with a normal affect.     Assessment and Plan  87 year old male with history of paroxysmal atrial fibrillation, macrocytic anemia, history of CVA,  admitted with acute respiratory failure with hypoxia felt to have possible aspiration pneumonia.  Also has history of paroxysmal atrial fibrillation on Eliquis.  Currently taken off of oral medications due to aspiration concern and placed on heparin.  Elevated high-sensitivity troponin which are flat consistent with demand.  No chest pain reported although patient is a difficult historian.  1.  Atrial fibrillation-currently appears to be in sinus rhythm.  Agree with heparin until patient can take p.o. then would resume Eliquis at 2.5 mg twice daily as he has been on as an outpatient.  Currently not on any rate related medications all the rate appears to be well controlled while in sinus rhythm.  We will need to replete potassium as is being done.  CHA2DS2-VASc score is at least 4 for age and previous CVA.  2.  Respiratory failure-likely secondary to aspiration pneumonia.  Further work-up is pending.  Will hold p.o. intake until this can be ascertained.  3.  Confusion-etiology unclear but may be related to aspiration pneumonia.  No acute findings on head CT.  No indication to preclude chronic or IV anticoagulation.  4.  Elevated troponin-flat.  Not consistent with acute  ischemic syndrome.  Not a candidate for invasive evaluation.  Echo has been ordered.  EF was 65% in 2019.  5.  Hypokalemia-being repleted.  6.  Chronic diastolic heart failure-no evidence of acute heart failure on exam.  Appears euvolemic.  Echo is been ordered to compare to 2019.  Deferring diuretics due to hypokalemia and no obvious heart failure.  7.  History of a cerebral stroke in the past-on Eliquis.  On IV heparin for now due to inability to take p.o.  Will need discontinue heparin and resume Eliquis at 2.5 mg twice daily on discharge.  Signed, Teodoro Spray MD 01/16/2021, 7:43 AM Pager: [phone]

## 2021-01-16 NOTE — Progress Notes (Signed)
PROGRESS NOTE   Lee Mercado  XAJ:[id number] DOB: 02-04-1925 DOA: 01/15/2021 PCP: Danae Orleans, MD  Brief Narrative:  49 white male [facility] P A. fib CHADS2 score >3/Eliquis, HTN, HLD, thrombocytosis (hydroxyurea) Recent admit 4/4 through 01/04/2021-hypoxic respiratory failure 2/2 bronchitis-presumed prior smoker 8 mm pulmonary nodule chronic T5, T9 compression fracture CKD 3 AA 2.0 X1.3 penetrating ulceration aortic isthmus under surveillance by vascular Return via EMS 4/18 SOB, dizzy, chronic cough-disoriented WBC 17 BNP 274 troponin 181 Failed swallow eval in ED-?  Aspiration-Rx Rocephin azithromycin-->Unasyn CT head negative for stroke chronic cerebellar infarcts Is a full code according to son   Hospital-Problem based course Sepsis secondary to aspiration Speech therapy input appreciated DNR now Continue to treat with Unasyn-continue D5 with K as below Severe hypokalemia Replaced with IV K and then D5 with K Recheck labs in a.m.-magnesium acceptable Demand ischemia Troponin trend flat-EKG no overt changes although does have fascicular blocks Appreciate cardiology input-not a candidate for invasive eval Echocardiogram still pending No reported chest pain on my exam with him today Atrial fibrillation CHADS2 score >4 on Eliquis Continue heparin GTT No acute but chronic HFpEF EF 55-60% 11/11/2017 Holding Lasix 20 Resume Toprol-XL 12.5 twice daily Thrombocytosis macrocytic anemia Continue Hydrea folic acid etc. Outpatient eval with hematology as per them Adult failure to thrive recurrent hospitalizations within 2 months Appreciate palliative care expertise-patient now DNR Discussions to continue over next several days  DVT prophylaxis: Heparin GTT Code Status: DNR Family Communication: None present Disposition:  Status is: Inpatient patient is not appropriate for discharge-family contemplating And need time to discuss in addition to determine whether  palliative trajectory versus therapeutic Expect can discharge in about 24 to 72 hours  Remains inpatient appropriate because:Hemodynamically unstable, Persistent severe electrolyte disturbances and Altered mental status   Dispo:  Patient From: [place]  Planned Disposition: [place]  Medically stable for discharge: No   Consultants:   Palliative care  Procedures: None  Antimicrobials:  Unasyn   Subjective: Quite weak but coherent On nasal oxygen Can verbalize his needs-seems to be coherent Speech therapy in room when I am seeing him animatedly discussing his care-it seems he is aspirating No current chest pain  Objective: Vitals:   01/15/21 1803 01/16/21 0008 01/16/21 0413 01/16/21 0732  BP: (!) 111/54 122/61 117/60 (!) 144/72  Pulse: 85 79 73 73  Resp: 18 18 16 20   Temp: 97.8 F (36.6 C) 98.5 F (36.9 C) 98.5 F (36.9 C) 98.8 F (37.1 C)  TempSrc:   Oral   SpO2: 100% 97% 100% 100%  Weight:      Height:        Intake/Output Summary (Last 24 hours) at 01/16/2021 0748 Last data filed at 01/16/2021 0947 Gross per 24 hour  Intake 1288.52 ml  Output 100 ml  Net 1188.52 ml   Filed Weights   01/15/21 1210  Weight: 81.6 kg    Examination:  EOMI NCAT frail cachectic white male supraclavicular, bitemporal wasting Power 5/5 but weak Poor posterior lung field exam CTA B to my exam S1-S2 no murmur?  Faint murmur LUSB Abdomen soft nontender no rebound  Data Reviewed: personally reviewed   WBC 17-->15 Lactic acid 1.4 Hemoglobin 11.5-->10.1, MCV 128 Platelet 427-->374- Potassium 2.9 magnesium 1.9 Troponin trending 181-->182 BUNs/creatinine 24/1.0-->26/1.1  Radiology Studies: CT HEAD WO CONTRAST  Result Date: 01/16/2021 CLINICAL DATA:  Delirium EXAM: CT HEAD WITHOUT CONTRAST TECHNIQUE: Contiguous axial images were obtained from the base of the skull through  the vertex without intravenous contrast. COMPARISON:  None. FINDINGS:  Brain: There is no mass, hemorrhage or extra-axial collection. There is generalized atrophy without lobar predilection. Hypodensity of the white matter is most commonly associated with chronic microvascular disease. There are multiple old cerebellar infarcts. Vascular: No abnormal hyperdensity of the major intracranial arteries or dural venous sinuses. No intracranial atherosclerosis. Skull: The visualized skull base, calvarium and extracranial soft tissues are normal. Sinuses/Orbits: No fluid levels or advanced mucosal thickening of the visualized paranasal sinuses. No mastoid or middle ear effusion. The orbits are normal. IMPRESSION: 1. No acute intracranial abnormality. 2. Chronic microvascular ischemia and generalized atrophy. 3. Multiple old cerebellar infarcts. Electronically Signed   By: Ulyses Jarred M.D.   On: 01/16/2021 02:55   DG Chest Port 1 View  Result Date: 01/15/2021 CLINICAL DATA:  Shortness of breath and dizziness. EXAM: PORTABLE CHEST 1 VIEW COMPARISON:  01/01/2021 FINDINGS: The cardiac silhouette, mediastinal and hilar contours are within normal limits and stable. The lungs are clear of an acute process. Low lung volumes with vascular crowding and streaky basilar atelectasis but no infiltrates, edema or effusions. The bony thorax is intact. IMPRESSION: Low lung volumes with vascular crowding and streaky basilar atelectasis. Electronically Signed   By: Marijo Sanes M.D.   On: 01/15/2021 12:45     Scheduled Meds: . levothyroxine  25 mcg Intravenous Daily   Continuous Infusions: . sodium chloride Stopped (01/16/21 4765)  . ampicillin-sulbactam (UNASYN) IV 3 g (01/16/21 1204)  . dextrose 5% lactated ringers with KCl 20 mEq/L 125 mL/hr at 01/16/21 1246  . heparin 1,150 Units/hr (01/16/21 0648)     LOS: 1 day   Time spent: [place], MD [facility] To contact the attending provider between 7A-7P or the covering provider during after hours 7P-7A, please log  into the web site [email or web address] and access using universal [place] password for that web site. If you do not have the password, please call the hospital operator.  01/16/2021, 7:48 AM

## 2021-01-16 NOTE — Progress Notes (Addendum)
[place] for heparin infusion Indication: atrial fibrillation  No Known Allergies  Patient Measurements: Height: 5\' 11"  (180.3 cm) Weight: 81.6 kg (180 lb) IBW/kg (Calculated) : 75.3 Heparin Dosing Weight: 81.6 kg  Vital Signs: Temp: 98.5 F (36.9 C) (04/19 0008) BP: 122/61 (04/19 0008) Pulse Rate: 79 (04/19 0008)  Labs: Recent Labs    01/15/21 1215 01/15/21 2304 01/16/21 0107  HGB 11.5*  --  10.1*  HCT 32.6*  --  28.5*  PLT 427*  --  374  CREATININE 1.09  --  1.10  TROPONINIHS 181* 183* 182*    Estimated Creatinine Clearance: 43.7 mL/min (by C-G formula based on SCr of 1.1 mg/dL).   Medical History: Past Medical History:  Diagnosis Date  . A-fib ([place])   . Cancer ([place])    skin cancers  . Coagulation defect ([place])   . Finger injury   . History of heat stroke   . Hyperlipidemia   . Hypertension   . Previous back surgery   . Stroke ([place])   . Vertigo     Medications:  PTA meds include Eliquis 2.5 mg BID. Pt iordered Lovenox 40 mg and given 1st dose 4/18 @ 2114  Assessment: Pt is 87 yo male started on heparin for med hx of afib, following CT Head Scan showed no acute sign of stroke.  Goal of Therapy:  Heparin level 0.3-0.7 units/ml Monitor platelets by anticoagulation protocol: Yes  Goal aPTT 66-102   Plan:  Ordered baseline labs Due to Lovenox dose, did not order bolus. Ordered heparin infusion to start at 1150 units/hr. Unless baseline labs indicate otherwise, due to Eliquis PTA, will follow aPTT until correlation with HL. Will order first aPTT 8 hours after infusion started. HL and CBC daily while on heparin.  Renda Rolls, PharmD, [facility] 01/16/2021 3:23 AM

## 2021-01-17 DIAGNOSIS — E43 Unspecified severe protein-calorie malnutrition: Secondary | ICD-10-CM

## 2021-01-17 LAB — CBC
HCT: 27.6 % — ABNORMAL LOW (ref 39.0–52.0)
Hemoglobin: 9.5 g/dL — ABNORMAL LOW (ref 13.0–17.0)
MCH: 44.8 pg — ABNORMAL HIGH (ref 26.0–34.0)
MCHC: 34.4 g/dL (ref 30.0–36.0)
MCV: 130.2 fL — ABNORMAL HIGH (ref 80.0–100.0)
Platelets: 396 10*3/uL (ref 150–400)
RBC: 2.12 MIL/uL — ABNORMAL LOW (ref 4.22–5.81)
RDW: 13.9 % (ref 11.5–15.5)
WBC: 15 10*3/uL — ABNORMAL HIGH (ref 4.0–10.5)
nRBC: 0 % (ref 0.0–0.2)

## 2021-01-17 LAB — APTT
aPTT: 83 seconds — ABNORMAL HIGH (ref 24–36)
aPTT: 64 seconds — ABNORMAL HIGH (ref 24–36)

## 2021-01-17 LAB — COMPREHENSIVE METABOLIC PANEL
ALT: 18 U/L (ref 0–44)
AST: 24 U/L (ref 15–41)
Albumin: 2.3 g/dL — ABNORMAL LOW (ref 3.5–5.0)
Alkaline Phosphatase: 78 U/L (ref 38–126)
Anion gap: 6 (ref 5–15)
BUN: 22 mg/dL (ref 8–23)
CO2: 31 mmol/L (ref 22–32)
Calcium: 8.5 mg/dL — ABNORMAL LOW (ref 8.9–10.3)
Chloride: 110 mmol/L (ref 98–111)
Creatinine, Ser: 1.07 mg/dL (ref 0.61–1.24)
GFR, Estimated: >60 mL/min (ref >60)
Glucose, Bld: 163 mg/dL — ABNORMAL HIGH (ref 70–99)
Potassium: 3.6 mmol/L (ref 3.5–5.1)
Sodium: 147 mmol/L — ABNORMAL HIGH (ref 135–145)
Total Bilirubin: 0.7 mg/dL (ref 0.3–1.2)
Total Protein: 5.4 g/dL — ABNORMAL LOW (ref 6.5–8.1)

## 2021-01-17 LAB — HEPARIN LEVEL (UNFRACTIONATED): Heparin Unfractionated: >1.1 IU/mL — ABNORMAL HIGH (ref 0.30–0.70)

## 2021-01-17 MED ORDER — POTASSIUM & SODIUM PHOSPHATES 280-160-250 MG PO PACK
2.0000 | PACK | ORAL | Status: AC
  Administered 2021-01-17 (×2): 2 via ORAL
  Filled 2021-01-17 (×2): qty 2

## 2021-01-17 MED ORDER — HEPARIN BOLUS VIA INFUSION
1200.0000 [IU] | Freq: Once | INTRAVENOUS | Status: AC
  Administered 2021-01-17: 18:00:00 1200 [IU] via INTRAVENOUS
  Filled 2021-01-17: qty 1200

## 2021-01-17 NOTE — Progress Notes (Signed)
[place] for heparin infusion Indication: atrial fibrillation  No Known Allergies  Patient Measurements: Height: 5\' 11"  (180.3 cm) Weight: 85.7 kg (188 lb 15 oz) IBW/kg (Calculated) : 75.3 Heparin Dosing Weight: 81.6 kg  Vital Signs: Temp: 98 F (36.7 C) (04/20 0423) BP: 101/85 (04/20 0423) Pulse Rate: 90 (04/20 0423)  Labs: Recent Labs    01/15/21 1215 01/15/21 2304 01/16/21 0107 01/16/21 0349 01/16/21 1151 01/16/21 2002 01/16/21 2053 01/17/21 0540  HGB 11.5*  --  10.1*  --   --   --   --  9.5*  HCT 32.6*  --  28.5*  --   --   --   --  27.6*  PLT 427*  --  374  --   --   --   --  396  APTT  --   --   --  50*   < > 140* 120* 83*  LABPROT  --   --   --  21.6*  --   --   --   --   INR  --   --   --  1.9*  --   --   --   --   HEPARINUNFRC  --   --   --  >1.10*  --   --   --  >1.10*  CREATININE 1.09  --  1.10  --   --   --   --  1.07  TROPONINIHS 181* 183* 182*  --   --   --   --   --    < > = values in this interval not displayed.    Estimated Creatinine Clearance: 45 mL/min (by C-G formula based on SCr of 1.07 mg/dL).   Medical History: Past Medical History:  Diagnosis Date  . A-fib ([place])   . Cancer ([place])    skin cancers  . Coagulation defect ([place])   . Finger injury   . History of heat stroke   . Hyperlipidemia   . Hypertension   . Previous back surgery   . Stroke ([place])   . Vertigo     Medications:  PTA meds include Eliquis 2.5 mg BID. Pt iordered Lovenox 40 mg and given 1st dose 4/18 @ 2114  Assessment: Pt is 87 yo male started on heparin for med hx of afib, following CT Head Scan showed no acute sign of stroke.  4/19 1151 aPTT 99, therapeutic x 1 4/19 2002 aPTT 140, supratherapeutic without dose/rate adjustment, nurse notified to hold and stat redraw ordered 4/19 2053 aPTT 120 supratherapeutic  4/20 0540 aPTT 83, therapeutic x 1, HL >1.1  Goal of Therapy:  Heparin level 0.3-0.7 units/ml Monitor platelets  by anticoagulation protocol: Yes  Goal aPTT 66-102   Plan:  Will continue infusion rate at 950 units/hr Will recheck next aPTT in 8 hours to confirm.   Willl follow aPTT until correlation with HL. Daily CBC while on Heparin drip.  Renda Rolls, PharmD, [facility] 01/17/2021 6:34 AM

## 2021-01-17 NOTE — Hospital Course (Addendum)
87 yr old male [facility] resident, history of paroxysmal A. fib CHADS2 score >3 on Eliquis, HTN, HLD, essential thrombocytosis (hydroxyurea), macrocytic anemia, chronic dizziness and vertigo, history of a CVA. Recent admit 4/4 through 01/04/2021 - hypoxic respiratory failure 2/2 bronchitis-presumed prior smoker.  Returned to ED via EMS on 4/18 with SOB, dizziness, chronic cough, disoriented.  Found to have WBC 17 BNP 274 troponin 181. Failed swallow eval in ED, is aspirating.   CT head negative except for chronic microvascular ischemic changes, generalized atrophy and multiple old cerebellar infarcts. CXR showed low lung volumes with vascular crowding and streaky basilar atelectasis.  Admitted and started on IV antibiotics for presumed aspiration PNA.   Initially on Rocephin azithromycin >>>Unasyn.  Palliative care consulted for goals of care given patient's advanced age, failure to thrive, and now aspirating.    Patient developed GI bleeding with BRBPR and melena on 4/20.  Heparin drip was stopped.  Given tenuous respiratory status with aspiration, not currently candidate for endoscopic evaluations.  Hemoglobin trend stable and bleeding resolved with stopping anticoagulation.  4/23-25: Patient continues to have dysphagia and high risk for aspiration, although respiratory status has been stable.  Requiring IV hydration for hypernatremia and worsening renal function both secondary to inability to orally hydrate.  Fluids d/c'd 4/25 to assess trend of labs to assist with prognosis and decision making in [place].

## 2021-01-17 NOTE — Progress Notes (Addendum)
PROGRESS NOTE     Lee Mercado   FYB:[id number]  DOB: 1925-01-24  PCP: Danae Orleans, MD    DOA: 01/15/2021 LOS: 2   Brief Narrative   87 yr old male [facility] resident, history of paroxysmal A. fib CHADS2 score >3 on Eliquis, HTN, HLD, essential thrombocytosis (hydroxyurea), microcytic anemia, chronic dizziness and vertigo, history of a CVA. Recent admit 4/4 through 01/04/2021 - hypoxic respiratory failure 2/2 bronchitis-presumed prior smoker.  Returned to ED via EMS on 4/18 with SOB, dizziness, chronic cough, disoriented.  Found to have WBC 17 BNP 274 troponin 181. Failed swallow eval in ED, is aspirating.   CT head negative except for chronic microvascular ischemic changes, generalized atrophy and multiple old cerebellar infarcts. CXR showed low lung volumes with vascular crowding and streaky basilar atelectasis.  Admitted and started on IV antibiotics for presumed aspiration PNA.   Initially on Rocephin azithromycin >>>Unasyn.  Palliative care consulted for goals of care given patient's advanced age, failure to thrive, and now aspirating.      Assessment & Plan   Active Problems:   Acute respiratory failure with hypoxia (HCC)   Protein-calorie malnutrition, severe  Dysphagia / Aspirating  Appreciate SLP's involvement and recommendations  Aspiration precautions  Family to come for SLP evaluation tomorrow to see how he's doing  Hypernatremia - Na 147 on 4/20.  Getting D5w already as below.  Monitor BMP.  Sepsis secondary to aspiration Speech therapy input appreciated DNR now Continue to treat with Unasyn Continue D5 with K as below  Severe hypokalemia Replaced with IV K and then D5 with K Recheck labs in a.m.-magnesium acceptable  Demand ischemia Troponin trend flat-EKG no overt changes although does have fascicular blocks Appreciate cardiology input-not a candidate for invasive eval Echocardiogram deferred No reported chest pain on my exam with him  today  Atrial fibrillation CHADS2 score >4 on Eliquis Continue heparin GTT  Chronic HFpEF - compensated, stable. EF 55-60% 11/11/2017 Holding Lasix 20 Resume Toprol-XL 12.5 twice daily  Essential Thrombocytosis, Macrocytic Anemia Continue Hydrea folic acid etc. Outpatient eval with hematology as per them  Adult failure to thrive recurrent hospitalizations within 2 months Appreciate palliative care expertise-patient now DNR Discussions to continue, time for outcomes    Patient BMI: Body mass index is 26.35 kg/m.   DVT prophylaxis: SCDs Start: 01/15/21 1337   Diet:  Diet Orders (From admission, onward)    Start     Ordered   01/16/21 1131  DIET - DYS 1 Room service appropriate? Yes with Assist; Fluid consistency: Nectar Thick  Diet effective now       Comments: No Straws.  Extra Gravy on meats, potatoes. Yogurt, pudding, Magic Cup, Oatmeal w/ sugar/butters(per speech) at meals.  Question Answer Comment  Room service appropriate? Yes with Assist   Fluid consistency: Nectar Thick      01/16/21 1132            Code Status: Partial Code    Subjective 01/17/21    Pt sleeping, woke easily.  Denies SOB, fever/chills.  Is asking for some ice.  Denies pain or discomfort.  Not sure why he's in the hospital.   Disposition Plan & Communication   Status is: Inpatient  Remains inpatient appropriate because:Inpatient level of care appropriate due to severity of illness, ongoing evaluation, on IV antibiotics   Dispo:  Patient From: [place]  Planned Disposition: Home  Medically stable for discharge: No     Family Communication: none at bedside, will  attempt to call.     Consults, Procedures, Significant Events   Consultants:   Palliative Care  SLP  Procedures:   None  Antimicrobials:  Anti-infectives (From admission, onward)   Start     Dose/Rate Route Frequency Ordered Stop   01/16/21 0000  Ampicillin-Sulbactam (UNASYN) 3 g in sodium  chloride 0.9 % 100 mL IVPB        3 g 200 mL/hr over 30 Minutes Intravenous Every 6 hours 01/15/21 2304     01/15/21 1245  cefTRIAXone (ROCEPHIN) 1 g in sodium chloride 0.9 % 100 mL IVPB        1 g 200 mL/hr over 30 Minutes Intravenous  Once 01/15/21 1237 01/15/21 1410   01/15/21 1245  azithromycin (ZITHROMAX) 500 mg in sodium chloride 0.9 % 250 mL IVPB        500 mg 250 mL/hr over 60 Minutes Intravenous  Once 01/15/21 1237 01/15/21 1514        Micro    Objective   Vitals:   01/17/21 0500 01/17/21 0729 01/17/21 1200 01/17/21 1635  BP:  110/81 128/74 (!) 125/52  Pulse:  85 70 72  Resp:  18 17 16   Temp:  98.1 F (36.7 C) 98.3 F (36.8 C) 98 F (36.7 C)  TempSrc:   Oral   SpO2:  100% 93%   Weight: 85.7 kg     Height:        Intake/Output Summary (Last 24 hours) at 01/17/2021 1921 Last data filed at 01/17/2021 1348 Gross per 24 hour  Intake 2501.42 ml  Output 400 ml  Net 2101.42 ml   Filed Weights   01/15/21 1210 01/17/21 0500  Weight: 81.6 kg 85.7 kg    Physical Exam:  General exam: awake, alert, no acute distress Respiratory system: rhonchi vs upper airway referred secretion sounds on left, no wheezes, normal respiratory effort. Cardiovascular system: normal S1/S2, RRR, trace lower extremity edema.   Gastrointestinal system: soft, NT, ND, +bowel sounds. Central nervous system: no gross focal neurologic deficits, garbled speech appears baseline, no facial droop Skin: dry, intact, normal temperature Psychiatry: normal mood, congruent affect, abnormal judgement and insight  Labs   Data Reviewed: I have personally reviewed following labs and imaging studies  CBC: Recent Labs  Lab 01/15/21 1215 01/16/21 0107 01/17/21 0540  WBC 17.1* 15.1* 15.0*  NEUTROABS  --  12.2*  --   HGB 11.5* 10.1* 9.5*  HCT 32.6* 28.5* 27.6*  MCV 128.9* 128.4* 130.2*  PLT 427* 374 161   Basic Metabolic Panel: Recent Labs  Lab 01/15/21 1215 01/16/21 0107 01/17/21 0540  NA 142  142 147*  K 3.0* 2.9* 3.6  CL 101 103 110  CO2 29 29 31   GLUCOSE 147* 144* 163*  BUN 24* 26* 22  CREATININE 1.09 1.10 1.07  CALCIUM 8.9 8.2* 8.5*  MG  --  1.9  --   PHOS  --  2.4*  --    GFR: Estimated Creatinine Clearance: 45 mL/min (by C-G formula based on SCr of 1.07 mg/dL). Liver Function Tests: Recent Labs  Lab 01/15/21 1215 01/16/21 0107 01/17/21 0540  AST 22 18 24   ALT 18 14 18   ALKPHOS 102 86 78  BILITOT 1.4* 0.9 0.7  PROT 6.8 5.8* 5.4*  ALBUMIN 3.0* 2.5* 2.3*   No results for input(s): LIPASE, AMYLASE in the last 168 hours. No results for input(s): AMMONIA in the last 168 hours. Coagulation Profile: Recent Labs  Lab 01/16/21 0349  INR 1.9*  Cardiac Enzymes: No results for input(s): CKTOTAL, CKMB, CKMBINDEX, TROPONINI in the last 168 hours. BNP (last 3 results) No results for input(s): PROBNP in the last 8760 hours. HbA1C: No results for input(s): HGBA1C in the last 72 hours. CBG: No results for input(s): GLUCAP in the last 168 hours. Lipid Profile: No results for input(s): CHOL, HDL, LDLCALC, TRIG, CHOLHDL, LDLDIRECT in the last 72 hours. Thyroid Function Tests: No results for input(s): TSH, T4TOTAL, FREET4, T3FREE, THYROIDAB in the last 72 hours. Anemia Panel: No results for input(s): VITAMINB12, FOLATE, FERRITIN, TIBC, IRON, RETICCTPCT in the last 72 hours. Sepsis Labs: Recent Labs  Lab 01/15/21 1902  LATICACIDVEN 1.4    Recent Results (from the past 240 hour(s))  Resp Panel by RT-PCR (Flu A&B, Covid) Nasopharyngeal Swab     Status: None   Collection Time: 01/15/21 12:12 PM   Specimen: Nasopharyngeal Swab; Nasopharyngeal(NP) swabs in vial transport medium  Result Value Ref Range Status   SARS Coronavirus 2 by RT PCR NEGATIVE NEGATIVE Final    Comment: (NOTE) SARS-CoV-2 target nucleic acids are NOT DETECTED.  The SARS-CoV-2 RNA is generally detectable in upper respiratory specimens during the acute phase of infection. The  lowest concentration of SARS-CoV-2 viral copies this assay can detect is 138 copies/mL. A negative result does not preclude SARS-Cov-2 infection and should not be used as the sole basis for treatment or other patient management decisions. A negative result may occur with  improper specimen collection/handling, submission of specimen other than nasopharyngeal swab, presence of viral mutation(s) within the areas targeted by this assay, and inadequate number of viral copies(<138 copies/mL). A negative result must be combined with clinical observations, patient history, and epidemiological information. The expected result is Negative.  Fact Sheet for Patients:  EntrepreneurPulse.com.au  Fact Sheet for Healthcare Providers:  IncredibleEmployment.be  This test is no t yet approved or cleared by the [place] FDA and  has been authorized for detection and/or diagnosis of SARS-CoV-2 by FDA under an Emergency Use Authorization (EUA). This EUA will remain  in effect (meaning this test can be used) for the duration of the COVID-19 declaration under Section 564(b)(1) of the Act, 21 U.S.C.section 360bbb-3(b)(1), unless the authorization is terminated  or revoked sooner.       Influenza A by PCR NEGATIVE NEGATIVE Final   Influenza B by PCR NEGATIVE NEGATIVE Final    Comment: (NOTE) The Xpert Xpress SARS-CoV-2/FLU/RSV plus assay is intended as an aid in the diagnosis of influenza from Nasopharyngeal swab specimens and should not be used as a sole basis for treatment. Nasal washings and aspirates are unacceptable for Xpert Xpress SARS-CoV-2/FLU/RSV testing.  Fact Sheet for Patients: EntrepreneurPulse.com.au  Fact Sheet for Healthcare Providers: IncredibleEmployment.be  This test is not yet approved or cleared by the [place] FDA and has been authorized for detection and/or diagnosis of SARS-CoV-2 by FDA under  an Emergency Use Authorization (EUA). This EUA will remain in effect (meaning this test can be used) for the duration of the COVID-19 declaration under Section 564(b)(1) of the Act, 21 U.S.C. section 360bbb-3(b)(1), unless the authorization is terminated or revoked.  Performed at [facility], [address] [place], [place] [address]   Blood culture (routine x 2)     Status: None (Preliminary result)   Collection Time: 01/15/21 12:15 PM   Specimen: BLOOD  Result Value Ref Range Status   Specimen Description BLOOD BLOOD RIGHT HAND  Final   Special Requests   Final    BOTTLES  DRAWN AEROBIC AND ANAEROBIC Blood Culture adequate volume   Culture   Final    NO GROWTH 2 DAYS Performed at [facility], [address] [place], [place] [address]    Report Status PENDING  Incomplete  Blood culture (routine x 2)     Status: None (Preliminary result)   Collection Time: 01/15/21  7:02 PM   Specimen: BLOOD  Result Value Ref Range Status   Specimen Description BLOOD LEFT ANTECUBITAL  Final   Special Requests   Final    BOTTLES DRAWN AEROBIC AND ANAEROBIC Blood Culture results may not be optimal due to an inadequate volume of blood received in culture bottles   Culture   Final    NO GROWTH 2 DAYS Performed at [facility], [address] [place], [place] [address]    Report Status PENDING  Incomplete      Imaging Studies   CT HEAD WO CONTRAST  Result Date: 01/16/2021 CLINICAL DATA:  [place] EXAM: CT HEAD WITHOUT CONTRAST TECHNIQUE: Contiguous axial images were obtained from the base of the skull through the vertex without intravenous contrast. COMPARISON:  None. FINDINGS: Brain: There is no mass, hemorrhage or extra-axial collection. There is generalized atrophy without lobar predilection. Hypodensity of the white matter is most commonly associated with chronic microvascular disease. There are multiple old cerebellar infarcts. Vascular: No abnormal  hyperdensity of the major intracranial arteries or dural venous sinuses. No intracranial atherosclerosis. Skull: The visualized skull base, calvarium and extracranial soft tissues are normal. Sinuses/Orbits: No fluid levels or advanced mucosal thickening of the visualized paranasal sinuses. No mastoid or middle ear effusion. The orbits are normal. IMPRESSION: 1. No acute intracranial abnormality. 2. Chronic microvascular ischemia and generalized atrophy. 3. Multiple old cerebellar infarcts. Electronically Signed   By: Ulyses Jarred M.D.   On: 01/16/2021 02:55     Medications   Scheduled Meds: . feeding supplement (NEPRO CARB STEADY)  237 mL Oral Q24H  . hydroxyurea  500 mg Oral Daily  . levothyroxine  50 mcg Oral Daily  . metoprolol tartrate  12.5 mg Oral BID  . multivitamin with minerals  1 tablet Oral Daily  . potassium & sodium phosphates  2 packet Oral Q4H   Continuous Infusions: . sodium chloride 250 mL (01/17/21 0611)  . ampicillin-sulbactam (UNASYN) IV 3 g (01/17/21 1324)  . heparin 1,050 Units/hr (01/17/21 1816)       LOS: 2 days    Time spent: 30 minutes    Ezekiel Slocumb, DO [facility]  01/17/2021, 7:21 PM      If 7PM-7AM, please contact night-coverage. How to contact the [facility] Attending or Consulting provider [place] or covering provider during after hours [place], for this patient?    1. Check the care team in [facility] and look for a) attending/consulting TR[facility] provider listed and b) the [facility] team listed 2. Log into [email or web address] and use [place]'s universal password to access. If you do not have the password, please contact the hospital operator. 3. Locate the [facility] provider you are looking for under [facility] and page to a number that you can be directly reached. 4. If you still have difficulty reaching the provider, please page the [facility] (Director on Call) for the Hospitalists listed on amion for assistance.

## 2021-01-17 NOTE — Progress Notes (Signed)
ANTICOAGULATION CONSULT NOTE   Pharmacy Consult for heparin infusion Indication: atrial fibrillation  No Known Allergies  Patient Measurements: Height: 5\' 11"  (180.3 cm) Weight: 85.7 kg (188 lb 15 oz) IBW/kg (Calculated) : 75.3 Heparin Dosing Weight: 81.6 kg  Vital Signs: Temp: 98 F (36.7 C) (04/20 1635) Temp Source: Oral (04/20 1200) BP: 125/52 (04/20 1635) Pulse Rate: 72 (04/20 1635)  Labs: Recent Labs    01/15/21 1215 01/15/21 2304 01/16/21 0107 01/16/21 0349 01/16/21 1151 01/16/21 2053 01/17/21 0540 01/17/21 1349  HGB 11.5*  --  10.1*  --   --   --  9.5*  --   HCT 32.6*  --  28.5*  --   --   --  27.6*  --   PLT 427*  --  374  --   --   --  396  --   APTT  --   --   --  50*   < > 120* 83* 64*  LABPROT  --   --   --  21.6*  --   --   --   --   INR  --   --   --  1.9*  --   --   --   --   HEPARINUNFRC  --   --   --  >1.10*  --   --  >1.10*  --   CREATININE 1.09  --  1.10  --   --   --  1.07  --   TROPONINIHS 181* 183* 182*  --   --   --   --   --    < > = values in this interval not displayed.    Estimated Creatinine Clearance: 45 mL/min (by C-G formula based on SCr of 1.07 mg/dL).   Medical History: Past Medical History:  Diagnosis Date  . A-fib ([place])   . Cancer ([place])    skin cancers  . Coagulation defect ([place])   . Finger injury   . History of heat stroke   . Hyperlipidemia   . Hypertension   . Previous back surgery   . Stroke ([place])   . Vertigo     Medications:  PTA meds include Eliquis 2.5 mg BID. Pt ordered Lovenox 40 mg and given 1st dose 4/18 @ 2114  Assessment: Pt is 87 yo male started on heparin for med hx of afib, following CT Head Scan showed no acute sign of stroke.  4/19 1151 aPTT 99, therapeutic x 1 4/19 2002 aPTT 140, supratherapeutic without dose/rate adjustment, nurse notified to hold and stat redraw ordered 4/19 2053 aPTT 120 supratherapeutic  4/20 0540 aPTT 83, therapeutic x 1, HL >1.1 4/20 1349 aPTT 64, subtherapeutic    Goal of Therapy:  Heparin level 0.3-0.7 units/ml Monitor platelets by anticoagulation protocol: Yes  Goal aPTT 66-102   Plan:  Will order heparin 1200 unit bolus x 1 and increase heparin infusion rate to 1050 units/hr Will recheck next aPTT in 8 hours to confirm.   Willl follow aPTT until correlation with HL. Daily CBC while on Heparin drip.  Darnelle Bos, PharmD 01/17/2021 4:38 PM

## 2021-01-17 NOTE — Progress Notes (Signed)
Speech Language Pathology Treatment: Dysphagia  Patient Details Name: Lee Mercado MRN: [id number] DOB: Mar 03, 1925 Today's Date: 01/17/2021 Time: 0174-9449 SLP Time Calculation (min) (ACUTE ONLY): 60 min  Assessment / Plan / Recommendation Clinical Impression  Pt seen today for ongoing assessment of swallowing. He again appears to present w/ oropharyngeal phase dysphagia w/ oropharyngeal phase deficits and suspected Neuromuscular impact during swallowing; also noted general weakness and fatigue w/ any exertion impacting his overall performance during po trials. Such weakness/fatigue during swallowing tasks increases risk for aspiration. Pt required min-mod verbal/visual cues during po tasks and MOD+ support w/ feeding during po trials. Pt was positioned Upright for assessment, po trials. Oral care required d/t to clear any sticky, dry saliva/secretions. Pt stated Baseline difficulty managing his own saliva for ~1 year now.  OF NOTE: noted a wet vocal quality at rest upon entering room for session PRIOR to any po's -- this increases risk for aspiration of own saliva/secretions.  Pt was supported w/ feeding and consumed trials of single ice chips w/ no immediate, overt s/s of aspiration noted. Time was given b/t trials for pt to utilize f/u, Dry swallows to clear any water residue, saliva. No decline in presentation noted during trials. Pt was then fed tsp trials of Nectar liquids and puree(from Lunch meal that arrived). Pt declined the foods after ~6 tsps bites, including chocolate pudding. W/ tsp trials of Nectar liquids, Delayed, overt clinical s/s of aspiration noted c/b coughing and an intermittent, wet vocal quality during trials. Suspect delayed pharyngeal swallow initiation as well as generalized pharyngeal weakness during swallowing to clear material/residue which could impact success of pharyngeal swallowing and safety w/ po's. Oral phase was c/b min slow, deliberate bolus management of food  trials; liquids were more timely in swallow and clearing. Pt stated he "just want ice chips" and asked again about drinking water.  Recommend continue dysphagia level 1 diet(PUREE) w/ Nectar consistency liquids; aspiration precautions; Pills Crushed in puree for safety; feeding support and supervision at meals, reduce Distractions during meals and check for oral clearing during/post intake. Stop feeding if increased coughing w/ po's noted. NSG/MD updated. Palliative Care updated. Will meet w/ family tomorrow during lunch meal for education and information.      HPI HPI: Patient is a 87 y.o. male with medical history significant for essential thrombocytosis, macrocytic anemia, hypothyroidism, CVA, paroxysmal A. fib on Eliquis, chronic dizziness and vertigo who presented to [facility] ED from SNF due to shortness of breath, productive cough, and hypoxemia, 86% on room air. Recent hospital discharge to SNF about 2 weeks ago after admit for acute bronchitis. Patient thought to have possible aspiration pneumonia. Chest x-ray was unremarkable although concern over possible aspiration due to failing a bedside swallowing study.  He was made n.p.o. due to this and placed on empiric antibiotics.  Head CT revealed no acute intracranial abnormality.  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  Chest x-ray revealed low lung volumes with vascular crowding but no obvious pulmonary edema.  OF NOTE, pt endorses "some" trouble swallowing and controlling own saliva for "~1 year" not s/p a "procedure" addressing "skin cancer on my neck". Pt stated to having had a "surgery" and "treatment" on his neck but was unclear if he had Radiation tx. This was related to the MD covering 01/16/2021.      SLP Plan  Continue with current plan of care       Recommendations  Diet recommendations: Dysphagia 1 (puree);Nectar-thick liquid Liquids  provided via: Teaspoon;Cup;No straw Medication  Administration: Crushed with puree Supervision: Staff to assist with self feeding;Full supervision/cueing for compensatory strategies Compensations: Minimize environmental distractions;Slow rate;Small sips/bites;Lingual sweep for clearance of pocketing;Multiple dry swallows after each bite/sip;Follow solids with liquid Postural Changes and/or Swallow Maneuvers: Seated upright 90 degrees;Upright 30-60 min after meal                General recommendations:  (Palliative Care following) Oral Care Recommendations: Oral care BID;Oral care before and after PO;Staff/trained caregiver to provide oral care Follow up Recommendations:  (TBD) SLP Visit Diagnosis: Dysphagia, oropharyngeal phase (R13.12) Plan: Continue with current plan of care       GO                 Lee Mercado, [place], [place] Pathologist Rehab Services [phone] [facility] 01/17/2021, 2:19 PM

## 2021-01-17 NOTE — Progress Notes (Signed)
Daily Progress Note   Patient Name: Lee Mercado       Date: 01/17/2021 DOB: 12-27-1924  Age: 87 y.o. MRN#: [id number] Attending Physician: Ezekiel Slocumb, DO Primary Care Physician: Danae Orleans, MD Admit Date: 01/15/2021  Reason for Consultation/Follow-up: Establishing goals of care  Subjective: Denies complaints but minimally interactive.   Length of Stay: 2  Current Medications: Scheduled Meds:  . feeding supplement (NEPRO CARB STEADY)  237 mL Oral Q24H  . hydroxyurea  500 mg Oral Daily  . levothyroxine  50 mcg Oral Daily  . metoprolol tartrate  12.5 mg Oral BID  . multivitamin with minerals  1 tablet Oral Daily    Continuous Infusions: . sodium chloride 250 mL (01/17/21 0611)  . ampicillin-sulbactam (UNASYN) IV 3 g (01/17/21 5929)  . dextrose 5% lactated ringers with KCl 20 mEq/L 125 mL/hr at 01/17/21 0816  . heparin 950 Units/hr (01/17/21 0438)    PRN Meds: sodium chloride  Physical Exam Constitutional:      General: He is not in acute distress. Pulmonary:     Effort: Pulmonary effort is normal.     Comments: 2 L [place] Musculoskeletal:     Right lower leg: No edema.     Left lower leg: No edema.  Skin:    General: Skin is warm and dry.  Neurological:     Mental Status: He is alert.     Comments: A bit disoriented to situation, did not understand why he was in the hospital             Vital Signs: BP 110/81 (BP Location: Left Arm)   Pulse 85   Temp 98.1 F (36.7 C)   Resp 18   Ht 5' 11"  (1.803 m)   Wt 85.7 kg   SpO2 100%   BMI 26.35 kg/m  SpO2: SpO2: 100 % O2 Device: O2 Device: Nasal Cannula O2 Flow Rate: O2 Flow Rate (L/min): 3 L/min  Intake/output summary:   Intake/Output Summary (Last 24 hours) at 01/17/2021 1153 Last data filed at 01/17/2021 0900 Gross  per 24 hour  Intake 2561.42 ml  Output 700 ml  Net 1861.42 ml   LBM: Last BM Date: 01/17/21 Baseline Weight: Weight: 81.6 kg Most recent weight: Weight: 85.7 kg       Palliative Assessment/Data: PPS 20% d/t PO intake    Flowsheet Rows   Flowsheet Row Most Recent Value  Intake Tab   Referral Department Hospitalist  Unit at Time of Referral Med/Surg Unit  Palliative Care Primary Diagnosis Sepsis/Infectious Disease  Date Notified 01/16/21  Palliative Care Type Return patient Palliative Care  Reason for referral Clarify Goals of Care  Date of Admission 01/15/21  Date first seen by Palliative Care 01/16/21  # of days Palliative referral response time 0 Day(s)  # of days IP prior to Palliative referral 1  Clinical Assessment   Palliative Performance Scale Score 20%  Psychosocial & Spiritual Assessment   Palliative Care Outcomes   Patient/Family meeting held? Yes  Who was at the meeting? son and daughter  Palliative Care Outcomes Clarified goals of care, Provided advance care planning, Provided psychosocial or spiritual support      Patient Active Problem List  Diagnosis Date Noted  . Protein-calorie malnutrition, severe 01/17/2021  . Acute respiratory failure with hypoxia ([place]) 01/15/2021  . Lower extremity edema   . Acute respiratory failure ([place]) 01/01/2021  . Acute bronchitis 01/01/2021  . Hypothyroidism 11/21/2017  . Vertigo 11/20/2017  . Macrocytic anemia 11/20/2017  . Gait instability 11/20/2017  . Prediabetes 11/20/2017  . Do not resuscitate 11/12/2017  . TIA (transient ischemic attack) 11/11/2017  . B12 deficiency 09/04/2017  . CKD (chronic kidney disease) stage 3, GFR 30-59 ml/min (HCC) 09/04/2017  . Carpal tunnel syndrome 08/07/2017  . Essential thrombocytosis ([place]) 08/06/2017  . Hyperlipidemia 08/06/2017  . Atrial fibrillation ([place]) 08/06/2017    Palliative Care Assessment & Plan   HPI: 87 y.o. male  with past medical history of essential  thrombocytosis, macrocytic anemia, hypothyroidism, CVA, paroxysmal A. fib on Eliquis, chronic dizziness and vertigo admitted on 01/15/2021 with shortness of breath. Being treated for suspected aspiration pneumonia.  Continues to be at risk for aspirations as well as at risk for inadequate nutrition and hydration. PMT consulted to discuss [place].  Assessment: First met with patient in an attempt to discuss situation with him. He does not understand why he is in the hospital. Yesterday I told him he had pneumonia. Today I tell him again and he seems surprised. I explain concern for aspiration and the reason for a strict diet to keep him safe. He tells me he is thirsty and repeatedly asks for more ice chips. Mental status limits further [place] discussions.   Discussed with speech therapy session today - no improvement in swallowing, signs of ongoing aspiration, poor appetite.   Spoke with son via phone. He is unable to be at the hospital today. Provided with my assessment and speech therapy's assessment of patient today. Son seems to acknowledge Mr. Mortellaro decline. Makes statements like "this is not looking good" and speaks about needing to start thinking about arrangements if he does not improve. I encouraged him to be present for a speech therapy evaluation so he could understand situation better - he was very open to this and plans to be here tomorrow at 12:30 during SLP eval - coordinated with SLP.   I will be off tomorrow - My colleague Lexine Baton will follow up.   Recommendations/Plan:  Son to be present for SLP eval tomorrow 4/21 at 12:30 - coordinated with Belenda Cruise SLP  Ongoing [place] discussions - son seems to better understand severity of situation  Patient very much wants water - further discussions needed with family about quality of life concerns, providing safest diet possible that allows for comfort and accepting risk of aspiration  Readdress code status pending further [place] conversations - would  recommend against intubation given chronic nature of illness and patient's frailty  Code Status:  Limited code - NO CPR, short term intubation okay  Discharge Planning:  To Be Determined  Care plan was discussed with Belenda Cruise, SLP, patient's son, Dr. Arbutus Ped, Osborne Oman NP  Thank you for allowing the Palliative Medicine Team to assist in the care of this patient.   Total Time 25 minutes Prolonged Time Billed  no       Greater than 50%  of this time was spent counseling and coordinating care related to the above assessment and plan.  Juel Burrow, DNP, [facility] Palliative Medicine Team Team Phone # [phone]  Pager [phone]

## 2021-01-18 DIAGNOSIS — R1310 Dysphagia, unspecified: Secondary | ICD-10-CM

## 2021-01-18 DIAGNOSIS — J690 Pneumonitis due to inhalation of food and vomit: Secondary | ICD-10-CM | POA: Diagnosis present

## 2021-01-18 DIAGNOSIS — K922 Gastrointestinal hemorrhage, unspecified: Secondary | ICD-10-CM | POA: Diagnosis not present

## 2021-01-18 LAB — COMPREHENSIVE METABOLIC PANEL
ALT: 20 U/L (ref 0–44)
AST: 28 U/L (ref 15–41)
Albumin: 2.2 g/dL — ABNORMAL LOW (ref 3.5–5.0)
Alkaline Phosphatase: 96 U/L (ref 38–126)
Anion gap: 8 (ref 5–15)
BUN: 20 mg/dL (ref 8–23)
CO2: 30 mmol/L (ref 22–32)
Calcium: 8.4 mg/dL — ABNORMAL LOW (ref 8.9–10.3)
Chloride: 108 mmol/L (ref 98–111)
Creatinine, Ser: 1.02 mg/dL (ref 0.61–1.24)
GFR, Estimated: >60 mL/min (ref >60)
Glucose, Bld: 130 mg/dL — ABNORMAL HIGH (ref 70–99)
Potassium: 3.6 mmol/L (ref 3.5–5.1)
Sodium: 146 mmol/L — ABNORMAL HIGH (ref 135–145)
Total Bilirubin: 0.8 mg/dL (ref 0.3–1.2)
Total Protein: 5.4 g/dL — ABNORMAL LOW (ref 6.5–8.1)

## 2021-01-18 LAB — HEMOGLOBIN AND HEMATOCRIT, BLOOD
HCT: 27 % — ABNORMAL LOW (ref 39.0–52.0)
HCT: 27.7 % — ABNORMAL LOW (ref 39.0–52.0)
HCT: 29.9 % — ABNORMAL LOW (ref 39.0–52.0)
Hemoglobin: 10.2 g/dL — ABNORMAL LOW (ref 13.0–17.0)
Hemoglobin: 9.3 g/dL — ABNORMAL LOW (ref 13.0–17.0)
Hemoglobin: 9.3 g/dL — ABNORMAL LOW (ref 13.0–17.0)

## 2021-01-18 LAB — APTT
aPTT: 82 seconds — ABNORMAL HIGH (ref 24–36)
aPTT: 50 seconds — ABNORMAL HIGH (ref 24–36)

## 2021-01-18 LAB — CBC
HCT: 25.7 % — ABNORMAL LOW (ref 39.0–52.0)
Hemoglobin: 8.8 g/dL — ABNORMAL LOW (ref 13.0–17.0)
MCH: 45.8 pg — ABNORMAL HIGH (ref 26.0–34.0)
MCHC: 34.2 g/dL (ref 30.0–36.0)
MCV: 133.9 fL — ABNORMAL HIGH (ref 80.0–100.0)
Platelets: 401 10*3/uL — ABNORMAL HIGH (ref 150–400)
RBC: 1.92 MIL/uL — ABNORMAL LOW (ref 4.22–5.81)
RDW: 14.4 % (ref 11.5–15.5)
WBC: 15.5 10*3/uL — ABNORMAL HIGH (ref 4.0–10.5)
nRBC: 0 % (ref 0.0–0.2)

## 2021-01-18 LAB — PROCALCITONIN: Procalcitonin: 0.19 ng/mL

## 2021-01-18 LAB — HEPARIN LEVEL (UNFRACTIONATED): Heparin Unfractionated: 0.61 IU/mL (ref 0.30–0.70)

## 2021-01-18 NOTE — Progress Notes (Signed)
  Speech Language Pathology Treatment: Dysphagia  Patient Details Name: Lee Mercado MRN: [id number] DOB: 04/12/1925 Today's Date: 01/18/2021 Time: 6283-6629 SLP Time Calculation (min) (ACUTE ONLY): 55 min  Assessment / Plan / Recommendation Clinical Impression  Pt seen today for ongoing assessment of toleration of diet; trials of ice chips and thin liquids for diet upgrade; and Education w/ hands-on modeling of aspiration precautions, dysphagia, and impact of dysphagia. Son and Caregiver present; Palliative Care Nurse arrived during session.   Recommend continue dysphagia level 1 diet(PUREE) w/ Nectar consistency liquids; aspiration precautions; Pills Crushed in puree for safety; feeding support and supervision at meals, reduce Distractions during meals and check for oral clearing during/post intake. Stop feeding if increased coughing w/ po's noted. NSG/MD updated. Palliative Care following for ongoing [place].  Pt may have Pleasure Ice Chips (single) w/ Supervision and aspiration precautions; Oral Care PRIOR to any ice chips and oral care.  Son agreed w/ above. NSG and MD updated.    HPI HPI: Patient is a 87 y.o. male with medical history significant for essential thrombocytosis, macrocytic anemia, hypothyroidism, CVA, paroxysmal A. fib on Eliquis, chronic dizziness and vertigo who presented to [facility] ED from SNF due to shortness of breath, productive cough, and hypoxemia, 86% on room air. Recent hospital discharge to SNF about 2 weeks ago after admit for acute bronchitis. Patient thought to have possible aspiration pneumonia. Chest x-ray was unremarkable although concern over possible aspiration due to failing a bedside swallowing study.  He was made n.p.o. due to this and placed on empiric antibiotics.  Head CT revealed no acute intracranial abnormality.  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  Chest x-ray revealed low lung volumes with vascular  crowding but no obvious pulmonary edema.  OF NOTE, pt endorses "some" trouble swallowing and controlling own saliva for "~1 year" not s/p a "procedure" addressing "skin cancer on my neck". Pt stated to having had a "surgery" and "treatment" on his neck but was unclear if he had Radiation tx. This was related to the MD covering 01/16/2021.      SLP Plan  Continue with current plan of care       Recommendations  Diet recommendations: Dysphagia 1 (puree);Nectar-thick liquid (ice chips for pleasure) Liquids provided via: Cup;No straw Medication Administration: Crushed with puree Supervision: Staff to assist with self feeding;Full supervision/cueing for compensatory strategies Compensations: Minimize environmental distractions;Slow rate;Small sips/bites;Lingual sweep for clearance of pocketing;Multiple dry swallows after each bite/sip;Follow solids with liquid Postural Changes and/or Swallow Maneuvers: Seated upright 90 degrees;Upright 30-60 min after meal                General recommendations:  (Palliative; Dietician) Oral Care Recommendations: Oral care BID;Oral care before and after PO;Staff/trained caregiver to provide oral care Follow up Recommendations: Skilled Nursing facility SLP Visit Diagnosis: Dysphagia, oropharyngeal phase (R13.12) Plan: Continue with current plan of care       GO                 [name], [place], [place] Pathologist Rehab Services [phone] [facility] 01/18/2021, 3:46 PM

## 2021-01-18 NOTE — Progress Notes (Signed)
Physical Therapy Treatment Patient Details Name: Abdulloh Ullom MRN: [id number] DOB: 05/30/1925 Today's Date: 01/18/2021    History of Present Illness      PT Comments    Several attempts this pm. Pt and family receiving education/training with ST   Follow Up Recommendations        Equipment Recommendations       Recommendations for Other Services       Precautions / Restrictions      Mobility  Bed Mobility                    Transfers                    Ambulation/Gait                 Stairs             Wheelchair Mobility    Modified Rankin (Stroke Patients Only)       Balance                                            Cognition                                              Exercises      General Comments        Pertinent Vitals/Pain      Home Living                      Prior Function            PT Goals (current goals can now be found in the care plan section)      Frequency           PT Plan      Co-evaluation              AM-PAC PT "6 Clicks" Mobility   Outcome Measure                   End of Session               Time:  -     Charges:                        Mikel Cella, PTA  Josie Dixon 01/18/2021, 1:45 PM

## 2021-01-18 NOTE — Progress Notes (Signed)
[place] for heparin infusion Indication: atrial fibrillation  No Known Allergies  Patient Measurements: Height: 5\' 11"  (180.3 cm) Weight: 85.7 kg (188 lb 15 oz) IBW/kg (Calculated) : 75.3 Heparin Dosing Weight: 81.6 kg  Vital Signs: Temp: 98.6 F (37 C) (04/20 2356) Temp Source: Oral (04/20 2057) BP: 126/83 (04/20 2356) Pulse Rate: 73 (04/20 2356)  Labs: Recent Labs    01/15/21 1215 01/15/21 2304 01/16/21 0107 01/16/21 0349 01/16/21 1151 01/17/21 0540 01/17/21 1349 01/17/21 2349 01/18/21 0103  HGB 11.5*  --  10.1*  --   --  9.5*  --  9.3* 8.8*  HCT 32.6*  --  28.5*  --   --  27.6*  --  27.7* 25.7*  PLT 427*  --  374  --   --  396  --   --  401*  APTT  --   --   --  50*   < > 83* 64*  --  82*  LABPROT  --   --   --  21.6*  --   --   --   --   --   INR  --   --   --  1.9*  --   --   --   --   --   HEPARINUNFRC  --   --   --  >1.10*  --  >1.10*  --   --   --   CREATININE 1.09  --  1.10  --   --  1.07  --   --  1.02  TROPONINIHS 181* 183* 182*  --   --   --   --   --   --    < > = values in this interval not displayed.    Estimated Creatinine Clearance: 47.2 mL/min (by C-G formula based on SCr of 1.02 mg/dL).   Medical History: Past Medical History:  Diagnosis Date  . A-fib ([place])   . Cancer ([place])    skin cancers  . Coagulation defect ([place])   . Finger injury   . History of heat stroke   . Hyperlipidemia   . Hypertension   . Previous back surgery   . Stroke ([place])   . Vertigo     Medications:  PTA meds include Eliquis 2.5 mg BID. Pt ordered Lovenox 40 mg and given 1st dose 4/18 @ 2114  Assessment: Pt is 87 yo male started on heparin for med hx of afib, following CT Head Scan showed no acute sign of stroke.  4/19 1151 aPTT 99, therapeutic x 1 4/19 2002 aPTT 140, supratherapeutic without dose/rate adjustment, nurse notified to hold and stat redraw ordered 4/19 2053 aPTT 120 supratherapeutic  4/20 0540 aPTT 83,  therapeutic x 1, HL >1.1 4/20 1349 aPTT 64, subtherapeutic  4/21 0103 aPTT 82, therapeutic x 1  Goal of Therapy:  Heparin level 0.3-0.7 units/ml Monitor platelets by anticoagulation protocol: Yes  Goal aPTT 66-102   Plan:  Will continue heparin infusion rate at 1050 units/hr Will recheck aPTT to confirm and daily HL in 8 hours. Willl follow aPTT until correlation with HL. Daily CBC while on Heparin drip.  Renda Rolls, PharmD, [facility] 01/18/2021 3:07 AM

## 2021-01-18 NOTE — Progress Notes (Addendum)
PROGRESS NOTE     Lee Mercado   VCB:[id number]  DOB: Jan 10, 1925  PCP: Danae Orleans, MD    DOA: 01/15/2021 LOS: 4   Brief Narrative   87 yr old male [facility] resident, history of paroxysmal A. fib CHADS2 score >3 on Eliquis, HTN, HLD, essential thrombocytosis (hydroxyurea), microcytic anemia, chronic dizziness and vertigo, history of a CVA. Recent admit 4/4 through 01/04/2021 - hypoxic respiratory failure 2/2 bronchitis-presumed prior smoker.  Returned to ED via EMS on 4/18 with SOB, dizziness, chronic cough, disoriented.  Found to have WBC 17 BNP 274 troponin 181. Failed swallow eval in ED, is aspirating.   CT head negative except for chronic microvascular ischemic changes, generalized atrophy and multiple old cerebellar infarcts. CXR showed low lung volumes with vascular crowding and streaky basilar atelectasis.  Admitted and started on IV antibiotics for presumed aspiration PNA.   Initially on Rocephin azithromycin >>>Unasyn.  Palliative care consulted for goals of care given patient's advanced age, failure to thrive, and now aspirating.    Patient developed GI bleeding with BRBPR and melena on 4/20.  Heparin drip was stopped.  Given tenuous respiratory status with aspiration, not currently candidate for endoscopic evaluations.  Hemoglobin trending stable thus far.      Assessment & Plan   Principal Problem:   Aspiration pneumonia ([place]) Active Problems:   Acute respiratory failure with hypoxia (HCC)   Protein-calorie malnutrition, severe   GI bleeding   Dysphagia   GI bleeding - RN staff report BRBPR and melena this AM.  Hbg is stable and actually increasing.  Trend H&H  Transfuse pRBC's if Hbg < 7.0   GI consulted, pt would not be candidate for endoscopic evaluation at this time due to respiratory status  Tagged RBC scan in pending  Son updated  Dysphagia / Aspirating  Appreciate SLP's involvement and recommendations  Aspiration precautions  Family to  come for SLP evaluation tomorrow to see how he's doing  Lower extremity edema - suspect due to getting fluids and Lasix held.    Monitor for now  Consider resume Lasix, hold off given soft BP's  Elevate legs, compression stockings  Hypernatremia - Na 147 on 4/20.  Off D5w currently.  Monitor BMP.  Sepsis Ruled Out - only SIRS criteria met was leukocytosis.  Aspiration pneumonia/pneumonitis Speech therapy input appreciated DNR now Continue Unasyn Currently off D5w with K   Severe hypokalemia Replaced with IV K and then D5 with K Recheck labs in a.m.-magnesium acceptable  Demand ischemia Troponin trend flat-EKG no overt changes although does have fascicular blocks Appreciate cardiology input-not a candidate for invasive eval Echocardiogram deferred No reported chest pain on my exam with him today  Atrial fibrillation CHADS2 score >4 on Eliquis Continue heparin GTT  Chronic HFpEF - compensated, stable. EF 55-60% 11/11/2017 Holding Lasix 20 Resume Toprol-XL 12.5 twice daily  Essential Thrombocytosis, Macrocytic Anemia Continue Hydrea folic acid etc. Outpatient eval with hematology as per them  Adult failure to thrive recurrent hospitalizations within 2 months Appreciate palliative care expertise-patient now DNR Discussions to continue, time for outcomes    Patient BMI: Body mass index is 26.81 kg/m.   DVT prophylaxis: SCDs Start: 01/15/21 1337   Diet:  Diet Orders (From admission, onward)    Start     Ordered   01/16/21 1131  DIET - DYS 1 Room service appropriate? Yes with Assist; Fluid consistency: Nectar Thick  Diet effective now       Comments: No Straws.  Extra [employer]  on meats, potatoes. Yogurt, pudding, Magic Cup, Oatmeal w/ sugar/butters(per speech) at meals.  Question Answer Comment  Room service appropriate? Yes with Assist   Fluid consistency: Nectar Thick      01/16/21 1132            Code Status: Partial Code    Subjective 01/19/21    Pt awake  in bed when seen today.  Reports feeling he may need to have a BM.  RN reports bleeding from rectum this AM, heparin stopped.  Pt denies abdominal pain.  Also denies other pain, SOB, N/V.  Reports ongoing cough but denies fever/chills.   Disposition Plan & Communication   Status is: Inpatient  Remains inpatient appropriate because:Inpatient level of care appropriate due to severity of illness, ongoing evaluation, on IV antibiotics   Dispo:  Patient From: [facility]  Planned Disposition: Home  Medically stable for discharge: No     Family Communication: updated son, Dominica [place], by phone this afternoon 4/21   Consults, Procedures, Significant Events   Consultants:   Palliative Care  SLP  Procedures:   None  Antimicrobials:  Anti-infectives (From admission, onward)   Start     Dose/Rate Route Frequency Ordered Stop   01/16/21 0000  Ampicillin-Sulbactam (UNASYN) 3 g in sodium chloride 0.9 % 100 mL IVPB        3 g 200 mL/hr over 30 Minutes Intravenous Every 6 hours 01/15/21 2304     01/15/21 1245  cefTRIAXone (ROCEPHIN) 1 g in sodium chloride 0.9 % 100 mL IVPB        1 g 200 mL/hr over 30 Minutes Intravenous  Once 01/15/21 1237 01/15/21 1410   01/15/21 1245  azithromycin (ZITHROMAX) 500 mg in sodium chloride 0.9 % 250 mL IVPB        500 mg 250 mL/hr over 60 Minutes Intravenous  Once 01/15/21 1237 01/15/21 1514        Micro    Objective   Vitals:   01/18/21 1947 01/19/21 0348 01/19/21 0500 01/19/21 0511  BP: 134/71 135/65    Pulse: 96 86    Resp: 20 20    Temp: 99.3 F (37.4 C) 98.4 F (36.9 C)    TempSrc: Oral Oral    SpO2: 93% 90%  93%  Weight:   87.2 kg   Height:       No intake or output data in the 24 hours ending 01/19/21 0754 Filed Weights   01/17/21 0500 01/18/21 0500 01/19/21 0500  Weight: 85.7 kg 87.5 kg 87.2 kg    Physical Exam:  General exam: awake, alert, no acute distress Respiratory system: left-sided rhonchi, no wheezes,  normal respiratory effort. Cardiovascular system: normal S1/S2, RRR, trace lower extremity edema.   Gastrointestinal system: soft, NT, ND, +bowel sounds. Neuro: CN's grossly intact, grossly non-focal exam Psychiatry: normal mood, congruent affect, abnormal judgement and insight  Labs   Data Reviewed: I have personally reviewed following labs and imaging studies  CBC: Recent Labs  Lab 01/15/21 1215 01/16/21 0107 01/17/21 0540 01/17/21 2349 01/18/21 0103 01/18/21 0654 01/18/21 1346 01/19/21 0427  WBC 17.1* 15.1* 15.0*  --  15.5*  --   --  12.1*  NEUTROABS  --  12.2*  --   --   --   --   --   --   HGB 11.5* 10.1* 9.5* 9.3* 8.8* 9.3* 10.2* 9.6*  HCT 32.6* 28.5* 27.6* 27.7* 25.7* 27.0* 29.9* 27.7*  MCV 128.9* 128.4* 130.2*  --  133.9*  --   --  132.5*  PLT 427* 374 396  --  401*  --   --  883*   Basic Metabolic Panel: Recent Labs  Lab 01/15/21 1215 01/16/21 0107 01/17/21 0540 01/18/21 0103 01/19/21 0427  NA 142 142 147* 146* 147*  K 3.0* 2.9* 3.6 3.6 3.2*  CL 101 103 110 108 108  CO2 _0 GLUCOSE 147* 144* 163* 130* 122*  BUN 24* 26* 22 20 25*  CREATININE 1.09 1.10 1.07 1.02 1.22  CALCIUM 8.9 8.2* 8.5* 8.4* 8.4*  MG  --  1.9  --   --  1.9  PHOS  --  2.4*  --   --   --    GFR: Estimated Creatinine Clearance: 39.4 mL/min (by C-G formula based on SCr of 1.22 mg/dL). Liver Function Tests: Recent Labs  Lab 01/15/21 1215 01/16/21 0107 01/17/21 0540 01/18/21 0103 01/19/21 0427  AST _1 ALT _2 ALKPHOS 102 86 78 96 93  BILITOT 1.4* 0.9 0.7 0.8 0.8  PROT 6.8 5.8* 5.4* 5.4* 5.7*  ALBUMIN 3.0* 2.5* 2.3* 2.2* 2.3*   No results for input(s): LIPASE, AMYLASE in the last 168 hours. No results for input(s): AMMONIA in the last 168 hours. Coagulation Profile: Recent Labs  Lab 01/16/21 0349  INR 1.9*   Cardiac Enzymes: No results for input(s): CKTOTAL, CKMB, CKMBINDEX, TROPONINI in the last 168 hours. BNP (last 3 results) No  results for input(s): PROBNP in the last 8760 hours. HbA1C: No results for input(s): HGBA1C in the last 72 hours. CBG: No results for input(s): GLUCAP in the last 168 hours. Lipid Profile: No results for input(s): CHOL, HDL, LDLCALC, TRIG, CHOLHDL, LDLDIRECT in the last 72 hours. Thyroid Function Tests: No results for input(s): TSH, T4TOTAL, FREET4, T3FREE, THYROIDAB in the last 72 hours. Anemia Panel: No results for input(s): VITAMINB12, FOLATE, FERRITIN, TIBC, IRON, RETICCTPCT in the last 72 hours. Sepsis Labs: Recent Labs  Lab 01/15/21 1902 01/18/21 0103  PROCALCITON  --  0.19  LATICACIDVEN 1.4  --     Recent Results (from the past 240 hour(s))  Resp Panel by RT-PCR (Flu A&B, Covid) Nasopharyngeal Swab     Status: None   Collection Time: 01/15/21 12:12 PM   Specimen: Nasopharyngeal Swab; Nasopharyngeal(NP) swabs in vial transport medium  Result Value Ref Range Status   SARS Coronavirus 2 by RT PCR NEGATIVE NEGATIVE Final    Comment: (NOTE) SARS-CoV-2 target nucleic acids are NOT DETECTED.  The SARS-CoV-2 RNA is generally detectable in upper respiratory specimens during the acute phase of infection. The lowest concentration of SARS-CoV-2 viral copies this assay can detect is 138 copies/mL. A negative result does not preclude SARS-Cov-2 infection and should not be used as the sole basis for treatment or other patient management decisions. A negative result may occur with  improper specimen collection/handling, submission of specimen other than nasopharyngeal swab, presence of viral mutation(s) within the areas targeted by this assay, and inadequate number of viral copies(<138 copies/mL). A negative result must be combined with clinical observations, patient history, and epidemiological information. The expected result is Negative.  Fact Sheet for Patients:  EntrepreneurPulse.com.au  Fact Sheet for Healthcare Providers:   IncredibleEmployment.be  This test is no t yet approved or cleared by the [place] FDA and  has been authorized for detection and/or diagnosis of SARS-CoV-2 by FDA under an Emergency Use Authorization (EUA). This EUA will remain  in effect (meaning this test can  be used) for the duration of the COVID-19 declaration under Section 564(b)(1) of the Act, 21 U.S.C.section 360bbb-3(b)(1), unless the authorization is terminated  or revoked sooner.       Influenza A by PCR NEGATIVE NEGATIVE Final   Influenza B by PCR NEGATIVE NEGATIVE Final    Comment: (NOTE) The Xpert Xpress SARS-CoV-2/FLU/RSV plus assay is intended as an aid in the diagnosis of influenza from Nasopharyngeal swab specimens and should not be used as a sole basis for treatment. Nasal washings and aspirates are unacceptable for Xpert Xpress SARS-CoV-2/FLU/RSV testing.  Fact Sheet for Patients: EntrepreneurPulse.com.au  Fact Sheet for Healthcare Providers: IncredibleEmployment.be  This test is not yet approved or cleared by the [place] FDA and has been authorized for detection and/or diagnosis of SARS-CoV-2 by FDA under an Emergency Use Authorization (EUA). This EUA will remain in effect (meaning this test can be used) for the duration of the COVID-19 declaration under Section 564(b)(1) of the Act, 21 U.S.C. section 360bbb-3(b)(1), unless the authorization is terminated or revoked.  Performed at [facility], [address] [place], [place] [address]   Blood culture (routine x 2)     Status: None (Preliminary result)   Collection Time: 01/15/21 12:15 PM   Specimen: BLOOD  Result Value Ref Range Status   Specimen Description BLOOD BLOOD RIGHT HAND  Final   Special Requests   Final    BOTTLES DRAWN AEROBIC AND ANAEROBIC Blood Culture adequate volume   Culture   Final    NO GROWTH 4 DAYS Performed at [facility], [address], [place], [place] [address]    Report Status PENDING  Incomplete  Blood culture (routine x 2)     Status: None (Preliminary result)   Collection Time: 01/15/21  7:02 PM   Specimen: BLOOD  Result Value Ref Range Status   Specimen Description BLOOD LEFT ANTECUBITAL  Final   Special Requests   Final    BOTTLES DRAWN AEROBIC AND ANAEROBIC Blood Culture results may not be optimal due to an inadequate volume of blood received in culture bottles   Culture   Final    NO GROWTH 4 DAYS Performed at [facility], [address] [place], [place] [address]    Report Status PENDING  Incomplete      Imaging Studies   No results found.   Medications   Scheduled Meds: . feeding supplement (NEPRO CARB STEADY)  237 mL Oral Q24H  . hydroxyurea  500 mg Oral Daily  . levothyroxine  50 mcg Oral Daily  . metoprolol tartrate  12.5 mg Oral BID  . multivitamin with minerals  1 tablet Oral Daily   Continuous Infusions: . sodium chloride 250 mL (01/17/21 0611)  . ampicillin-sulbactam (UNASYN) IV 3 g (01/19/21 0325)  . dextrose 5 % with KCl 20 mEq / L    . potassium chloride         LOS: 4 days    Time spent: 30 minutes with > 50% spent at bedside and in coordination of care.    Ezekiel Slocumb, DO [facility]  01/19/2021, 7:54 AM      If 7PM-7AM, please contact night-coverage. How to contact the [facility] Attending or Consulting provider [place] or covering provider during after hours [place], for this patient?    1. Check the care team in [facility] and look for a) attending/consulting [facility] provider listed and b) the [facility] team listed 2. Log into [email or web address] and use [facility]'s universal password to access. If you do not have the password, please contact the hospital operator. 3. Locate the [facility] provider you are looking for under [facility] and page to a number that you can be directly reached. 4. If you still have difficulty reaching the provider, please page the [facility]  (Director on Call) for the Hospitalists listed on amion for assistance.

## 2021-01-18 NOTE — Progress Notes (Signed)
Patient Name: Lee Mercado Date of Encounter: 01/18/2021  Hospital Problem List     Active Problems:   Acute respiratory failure with hypoxia (HCC)   Protein-calorie malnutrition, severe    Patient Profile     87 y.o. male with history of essential thrombocytosis, microcytic anemia, paroxysmal atrial fibrillation on 2.5 mg of Eliquis chronically, chronic dizziness and vertigo, history of a CVA, who presented to [facility] from a skilled nursing facility due to increased shortness of breath cough and hypoxemia with a pulse ox of 86% on room air.  Patient is a difficult historian.  Was recently hospitalized at [facility] several weeks ago where he was admitted for acute on chronic bronchitis and was discharged to skilled nursing facility.  Per chart, he denied chest pain.  Chest x-ray was unremarkable although concern over possible aspiration due to failing a bedside swallowing study.  He was made n.p.o. due to this and placed on empiric antibiotics.  He was taken off of Eliquis and placed on heparin for his atrial fibrillation due to concern over aspiration.  Cardiology consultation requested due to A. fib and elevated troponin.  Troponins were drawn which have been flat at 181/183/182.  Patient serum potassium was 3.8 on admission 2.9 at present.  Serum creatinine of 78.78-year-old 9-1.1 which appears to be at his baseline.  White count is 15.1 hemoglobin 10.1 which is his baseline.  His platelet count is 374K.  EKG shows sinus arrhythmia at a rate of 75 with right bundle branch block.  Due to confusion head CT was done to rule out hemorrhagic stroke.  This revealed no acute intracranial abnormality.  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  Chest x-ray revealed low lung volumes with vascular crowding but no obvious pulmonary edema.  Subjective   Difficult historian  Inpatient Medications    . feeding supplement (NEPRO CARB STEADY)  237 mL Oral Q24H  .  hydroxyurea  500 mg Oral Daily  . levothyroxine  50 mcg Oral Daily  . metoprolol tartrate  12.5 mg Oral BID  . multivitamin with minerals  1 tablet Oral Daily    Vital Signs    Vitals:   01/17/21 2356 01/18/21 0436 01/18/21 0500 01/18/21 0745  BP: 126/83 (!) 123/58  (!) 106/52  Pulse: 73 72  77  Resp:  16  20  Temp: 98.6 F (37 C) 98.3 F (36.8 C)  97.8 F (36.6 C)  TempSrc:  Oral    SpO2: 100% 100%  97%  Weight:   87.5 kg   Height:        Intake/Output Summary (Last 24 hours) at 01/18/2021 0830 Last data filed at 01/18/2021 0436 Gross per 24 hour  Intake 120 ml  Output 400 ml  Net -280 ml   Filed Weights   01/15/21 1210 01/17/21 0500 01/18/21 0500  Weight: 81.6 kg 85.7 kg 87.5 kg    Physical Exam    GEN: chronically ill appearing male HEENT: normal.  Neck: Supple, no JVD, carotid bruits, or masses. Cardiac: RRR, no murmurs, rubs, or gallops. No clubbing, cyanosis, edema.  Radials/DP/PT 2+ and equal bilaterally.  Respiratory:  Rhonchi bilaterally GI: Soft, nontender, nondistended, BS + x 4. MS: no deformity or atrophy. Skin: warm and dry, no rash. Neuro:  Somewhat lethargic. Arouses fairly easily  Labs    CBC Recent Labs    01/16/21 0107 01/17/21 0540 01/17/21 2349 01/18/21 0103 01/18/21 0654  WBC 15.1* 15.0*  --  15.5*  --  NEUTROABS 12.2*  --   --   --   --   HGB 10.1* 9.5*   < > 8.8* 9.3*  HCT 28.5* 27.6*   < > 25.7* 27.0*  MCV 128.4* 130.2*  --  133.9*  --   PLT 374 396  --  401*  --    < > = values in this interval not displayed.   Basic Metabolic Panel Recent Labs    01/16/21 0107 01/17/21 0540 01/18/21 0103  NA 142 147* 146*  K 2.9* 3.6 3.6  CL 103 110 108  CO2 29 31 30   GLUCOSE 144* 163* 130*  BUN 26* 22 20  CREATININE 1.10 1.07 1.02  CALCIUM 8.2* 8.5* 8.4*  MG 1.9  --   --   PHOS 2.4*  --   --    Liver Function Tests Recent Labs    01/17/21 0540 01/18/21 0103  AST 24 28  ALT 18 20  ALKPHOS 78 96  BILITOT 0.7 0.8  PROT  5.4* 5.4*  ALBUMIN 2.3* 2.2*   No results for input(s): LIPASE, AMYLASE in the last 72 hours. Cardiac Enzymes No results for input(s): CKTOTAL, CKMB, CKMBINDEX, TROPONINI in the last 72 hours. BNP Recent Labs    01/15/21 1215  BNP 274.6*   D-Dimer No results for input(s): DDIMER in the last 72 hours. Hemoglobin A1C No results for input(s): HGBA1C in the last 72 hours. Fasting Lipid Panel No results for input(s): CHOL, HDL, LDLCALC, TRIG, CHOLHDL, LDLDIRECT in the last 72 hours. Thyroid Function Tests No results for input(s): TSH, T4TOTAL, T3FREE, THYROIDAB in the last 72 hours.  Invalid input(s): FREET3  Telemetry      ECG    nsr  Radiology    DG Chest 1 View  Result Date: 01/01/2021 CLINICAL DATA:  87 year old male status post fall EXAM: CHEST  1 VIEW COMPARISON:  None. FINDINGS: Very low inspiratory volumes. Cardiac and mediastinal contours are within normal limits. Mild interstitial prominence and bronchial wall thickening are favored to be chronic in nature. Gaseous distension of the colon and small bowel. Left basilar patchy airspace opacity is nonspecific. No acute osseous abnormality. IMPRESSION: 1. Very low inspiratory volumes with probable left basilar atelectasis versus scarring. 2. Probable chronic bronchitic changes and interstitial prominence. 3. Mild gaseous distension of colon and small bowel in the visualized upper abdomen. 4. No acute fracture visualized. Electronically Signed   By: Jacqulynn Cadet M.D.   On: 01/01/2021 07:48   CT HEAD WO CONTRAST  Result Date: 01/16/2021 CLINICAL DATA:  Delirium EXAM: CT HEAD WITHOUT CONTRAST TECHNIQUE: Contiguous axial images were obtained from the base of the skull through the vertex without intravenous contrast. COMPARISON:  None. FINDINGS: Brain: There is no mass, hemorrhage or extra-axial collection. There is generalized atrophy without lobar predilection. Hypodensity of the white matter is most commonly associated with  chronic microvascular disease. There are multiple old cerebellar infarcts. Vascular: No abnormal hyperdensity of the major intracranial arteries or dural venous sinuses. No intracranial atherosclerosis. Skull: The visualized skull base, calvarium and extracranial soft tissues are normal. Sinuses/Orbits: No fluid levels or advanced mucosal thickening of the visualized paranasal sinuses. No mastoid or middle ear effusion. The orbits are normal. IMPRESSION: 1. No acute intracranial abnormality. 2. Chronic microvascular ischemia and generalized atrophy. 3. Multiple old cerebellar infarcts. Electronically Signed   By: Ulyses Jarred M.D.   On: 01/16/2021 02:55   CT Head Wo Contrast  Result Date: 01/01/2021 CLINICAL DATA:  Recent falls EXAM: CT  HEAD WITHOUT CONTRAST TECHNIQUE: Contiguous axial images were obtained from the base of the skull through the vertex without intravenous contrast. COMPARISON:  Head CT November 11, 2017 and brain MRI November 12, 2017 FINDINGS: Brain: Mild diffuse atrophy is stable. There is no intracranial mass, hemorrhage, extra-axial fluid collection, or midline shift. 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 There is small vessel disease in the centra semiovale bilaterally, stable. No acute appearing infarct is evident on this study. Vascular: No hyperdense vessel. There is calcification in each carotid siphon region. Skull: Bony calvarium appears intact. Sinuses/Orbits: There is a small retention cyst in mid left ethmoid region. Other visualized paranasal sinuses are clear. Apparent prior cataract removal on the left. Orbits otherwise appear symmetric bilaterally. Other: Visualized mastoid air cells are clear. 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 No acute infarct evident. No mass or hemorrhage. There are foci of arterial vascular calcification. Small  retention cyst in a mid left ethmoid air cell. Electronically Signed   By: Lowella Grip III M.D.   On: 01/01/2021 08:58   CT Chest Wo Contrast  Result Date: 01/01/2021 CLINICAL DATA:  Pneumonia, pleural effusion or abscess suspected. EXAM: CT CHEST WITHOUT CONTRAST TECHNIQUE: Multidetector CT imaging of the chest was performed following the standard protocol without IV contrast. COMPARISON:  Chest x-ray obtained earlier today FINDINGS: Cardiovascular: Limited evaluation in the absence of intravenous contrast. No evidence of aneurysm. Scattered heterogeneous atherosclerotic plaque. Small penetrating atherosclerotic ulceration in the region of the ligamentum arteriosum. The maximal aortic diameter at this location is 3.7 cm which is nonaneurysmal. Calcifications present throughout the coronary arteries. The heart is normal in size. No pericardial effusion. Mediastinum/Nodes: Unremarkable CT appearance of the thyroid gland. No suspicious mediastinal or hilar adenopathy. No soft tissue mediastinal mass. The thoracic esophagus is unremarkable. Lungs/Pleura: Nonspecific 0.8 mm pulmonary nodule in the right lung apex which may represent a focus of pleuroparenchymal scarring. Diffuse mild bronchial wall thickening. Minimal dependent atelectasis in the lower lungs. No focal infiltrate, pleural effusion or pneumothorax. Upper Abdomen: No acute abnormality. Musculoskeletal: T5 compression fracture with approximately 50% height loss appears chronic. No focal lucency. Compression deformity of the inferior endplate of T9 with less than 20% height loss also appears chronic in nature. No acute osseous abnormality. IMPRESSION: 1. Low inspiratory volumes with mild bibasilar atelectasis. 2. Diffuse bilateral lower lobe bronchial wall thickening is favored to be chronic in nature. Acute on chronic bronchitis is difficult to exclude by imaging alone. 3. 8 mm pulmonary nodule in the right lung apex. Non-contrast chest CT at 6-12  months is recommended. If the nodule is stable at time of repeat CT, then future CT at 18-24 months (from today's scan) is considered optional for low-risk patients, but is recommended for high-risk patients. This recommendation follows the consensus statement: Guidelines for Management of Incidental Pulmonary Nodules Detected on CT Images: From the Fleischner Society 2017; Radiology 2017; 284:228-243. 4. Aortic atherosclerosis with a 2.0 x 1.3 cm penetrating atherosclerotic ulceration at the aortic isthmus. Aortic Atherosclerosis (ICD10-I70.0). 5. T5 and T9 compression fractures are favored to be chronic in nature. Electronically Signed   By: Jacqulynn Cadet M.D.   On: 01/01/2021 08:58   DG Chest Port 1 View  Result Date: 01/15/2021 CLINICAL DATA:  Shortness of breath and dizziness. EXAM: PORTABLE CHEST 1 VIEW COMPARISON:  01/01/2021 FINDINGS: The cardiac silhouette, mediastinal and hilar contours are within normal limits and stable.  The lungs are clear of an acute process. Low lung volumes with vascular crowding and streaky basilar atelectasis but no infiltrates, edema or effusions. The bony thorax is intact. IMPRESSION: Low lung volumes with vascular crowding and streaky basilar atelectasis. Electronically Signed   By: Marijo Sanes M.D.   On: 01/15/2021 12:45    Assessment & Plan    87 year old male with history of paroxysmal atrial fibrillation, macrocytic anemia, history of CVA, admitted with acute respiratory failure with hypoxia felt to have possible aspiration pneumonia.  Also has history of paroxysmal atrial fibrillation on Eliquis.  Currently taken off of oral medications due to aspiration concern and placed on heparin.  Elevated high-sensitivity troponin which are flat consistent with demand.  No chest pain reported although patient is a difficult historian.  1.  Atrial fibrillation-currently appears to be in sinus rhythm.  Agree with heparin until patient can take p.o. then would resume  Eliquis at 2.5 mg twice daily as he has been on as an outpatient.  Currently not on any rate related medications all the rate appears to be well controlled while in sinus rhythm.  We will need to replete potassium as is being done.  CHA2DS2-VASc score is at least 4 for age and previous CVA.  2.  Respiratory failure-likely secondary to aspiration pneumonia.  Further work-up is pending.  Will hold p.o. intake until this can be ascertained.  3.  Confusion-etiology unclear but may be related to aspiration pneumonia.  No acute findings on head CT.  No indication to preclude chronic or IV anticoagulation.  4.  Elevated troponin-flat.  Not consistent with acute ischemic syndrome.  Not a candidate for invasive evaluation.  Echo has been ordered.  EF was 65% in 2019.  5.  Hypokalemia-being repleted.  6.  Chronic diastolic heart failure-no evidence of acute heart failure on exam.  Appears euvolemic. Deferring diuretics due to hypokalemia and no obvious heart failure.  7.  History of a cerebral stroke in the past-on Eliquis.  On IV heparin for now due to inability to take p.o.  Will need discontinue heparin and resume Eliquis at 2.5 mg twice daily on discharge.  Agree with palliative care.    Signed, Javier Docker Barron Vanloan MD 01/18/2021, 8:30 AM  Pager: [phone]

## 2021-01-18 NOTE — Progress Notes (Signed)
   Daily Progress Note   Patient Name: Lee Mercado       Date: 01/18/2021 DOB: June 13, 1925  Age: 87 y.o. MRN#: [id number] Attending Physician: Ezekiel Slocumb, DO Primary Care Physician: Danae Orleans, MD Admit Date: 01/15/2021  Reason for Consultation/Follow-up: Establishing goals of care  Subjective: Reviewed.  Updates received.  Patient is much more awake, alert and interactive today.  Denies pain or shortness of breath.  Continues to have congestive cough. Belenda Cruise, SLP is at the bedside providing extensive education to family including feeding trials.  Son and caregiver at the bedside.  Lengthy discussion with family after education provided regarding recommended diet.  We discussed at length patient's current illness, comorbidities, and continued high risk for aspiration including complications.  Family verbalizes understanding and expresses wishes to continue to treat the treatable with hopes of some stability/improvement.  Son speaks to patient's awake and alert Ms. today compared to other days which continues to provide him with some hope.  We discussed patient's partial CODE STATUS consideration of his current illness, comorbidities, and request to be allowed water and ice chips on occasions.  Patient and son continues to request partial code with acceptance of temporary heroic measures, no compressions.  Son states this would be a trial and if patient was not able to survive or required long-term life prolonging measures they would then wish to transition his care to focus on his comfort.  All questions answered and support provided.  Length of Stay: 3 days  Vital Signs: BP 114/67 (BP Location: Left Arm)   Pulse 79   Temp 97.8 F (36.6 C)   Resp 18   Ht 5\' 11"  (1.803 m)   Wt 87.5 kg   SpO2 92%   BMI 26.90 kg/m  SpO2: SpO2: 92 % O2 Device: O2 Device: Nasal Cannula O2 Flow Rate: O2 Flow Rate (L/min): 2 L/min  Physical Exam: Awake, alert, chronically ill  appearing RRR Rhonchi, congested cough Mood appropriate           Palliative Care Assessment & Plan    Code Status:  Limited code No CPR   Goals of Care/Recommendations:  Continue to treat the treatable  Family remaining hopeful for some stability but also preparing for further decline, no improvement. Would not want patient to suffer or undergo life-prolonging measures for long period. Would be more open to focus on his comfort in the future with understanding of high risk of sudden decline and complications related to continued aspiration. Does not think they would want artificial feeding tube which was recommended against.   Family hopeful for continued rehabilitation as they are encouraged by his awake and alertness on today.   Outpatient Palliative support at discharge.   PMT will continue to support and follow as needed. Please contact team line or by secure chat with urgent needs.   Prognosis: Guarded-Poor   Discharge Planning: [place] for rehab with Palliative care service follow-up  Thank you for allowing the Palliative Medicine Team to assist in the care of this patient.  Time Total: 65 min.   Visit consisted of counseling and education dealing with the complex and emotionally intense issues of symptom management and palliative care in the setting of serious and potentially life-threatening illness.Greater than 50%  of this time was spent counseling and coordinating care related to the above assessment and plan.  Alda Lea, AGPCNP-BC  Palliative Medicine Team [phone]

## 2021-01-18 NOTE — TOC Initial Note (Signed)
Transition of Care [facility]) - Initial/Assessment Note    Patient Details  Name: Lee Mercado MRN: [id number] Date of Birth: July 22, 1925  Transition of Care [facility]) CM/SW Contact:    Shelbie Hutching, RN Phone Number: 01/18/2021, 3:43 PM  Clinical Narrative:                 Patient admitted for respiratory failure and aspiration.  Patient came to the hospital from [facility] where he was discharged 2 weeks ago from the hospital.  Since going to [facility] patient has declined.  Palliative Medicine team is following for goals of care.  Currently the family is not ready for comfort measures or hospice care but would like to continue to treat.  PT is currently recommending SNF.  TOC will follow patient and assist with discharge as appropriate.     Expected Discharge Plan: Skilled Nursing Facility Barriers to Discharge: Continued Medical Work up   Patient Goals and CMS Choice Patient states their goals for this hospitalization and ongoing recovery are:: patient unable to state goals but family would like to treat the treatable and see how patient does      Expected Discharge Plan and Services Expected Discharge Plan: [place] In-house Referral: Hospice / Palliative Care Discharge Planning Services: CM Consult   Living arrangements for the past 2 months: [place]                 DME Arranged: N/A DME Agency: NA       HH Arranged: NA          Prior Living Arrangements/Services Living arrangements for the past 2 months: [place] Lives with:: Spouse Patient language and need for interpreter reviewed:: Yes Do you feel safe going back to the place where you live?: Yes      Need for Family Participation in Patient Care: Yes (Comment) (respiratory failure, failure to thrive- aspiration) Care giver support system in place?: Yes (comment) (children)   Criminal Activity/Legal Involvement Pertinent to Current Situation/Hospitalization: No -  Comment as needed  Activities of Daily Living Home Assistive Devices/Equipment: None ADL Screening (condition at time of admission) Patient's cognitive ability adequate to safely complete daily activities?: No Is the patient deaf or have difficulty hearing?: Yes Does the patient have difficulty seeing, even when wearing glasses/contacts?: Yes Does the patient have difficulty concentrating, remembering, or making decisions?: Yes Patient able to express need for assistance with ADLs?: No Does the patient have difficulty dressing or bathing?: Yes Independently performs ADLs?: No Communication: Needs assistance Is this a change from baseline?: Pre-admission baseline Dressing (OT): Needs assistance Is this a change from baseline?: Pre-admission baseline Grooming: Needs assistance Is this a change from baseline?: Pre-admission baseline Feeding: Needs assistance Is this a change from baseline?: Pre-admission baseline Bathing: Needs assistance Is this a change from baseline?: Pre-admission baseline Toileting: Needs assistance Is this a change from baseline?: Pre-admission baseline In/Out Bed: Needs assistance Is this a change from baseline?: Pre-admission baseline Walks in Home: Needs assistance Is this a change from baseline?: Pre-admission baseline Does the patient have difficulty walking or climbing stairs?: Yes Weakness of Legs: Both Weakness of Arms/Hands: Both  Permission Sought/Granted Permission sought to share information with : [place] granted to share information with : Yes, Verbal Permission Granted  Share Information with NAME: Dominica Severin and [place] granted to share info w AGENCY: SNF for rehab  Permission granted to share info w Relationship: son and daughter  Emotional Assessment Appearance:: Appears stated age Attitude/Demeanor/Rapport: Lethargic Affect (typically observed): Accepting,Quiet Orientation: : Oriented to  Self,Oriented to Place Alcohol / Substance Use: Not Applicable Psych Involvement: No (comment)  Admission diagnosis:  Acute respiratory failure with hypoxia ([place]) [J96.01] Patient Active Problem List   Diagnosis Date Noted  . GI bleeding 01/18/2021  . Dysphagia 01/18/2021  . Aspiration pneumonia ([place]) 01/18/2021  . Protein-calorie malnutrition, severe 01/17/2021  . Acute respiratory failure with hypoxia ([place]) 01/15/2021  . Lower extremity edema   . Acute respiratory failure ([place]) 01/01/2021  . Acute bronchitis 01/01/2021  . Hypothyroidism 11/21/2017  . Vertigo 11/20/2017  . Macrocytic anemia 11/20/2017  . Gait instability 11/20/2017  . Prediabetes 11/20/2017  . Do not resuscitate 11/12/2017  . TIA (transient ischemic attack) 11/11/2017  . B12 deficiency 09/04/2017  . CKD (chronic kidney disease) stage 3, GFR 30-59 ml/min (HCC) 09/04/2017  . Carpal tunnel syndrome 08/07/2017  . Essential thrombocytosis ([place]) 08/06/2017  . Hyperlipidemia 08/06/2017  . Atrial fibrillation ([place]) 08/06/2017   PCP:  Danae Orleans, MD Pharmacy:   [place], [place] - [facility] [address] Phone: [phone] Fax: [phone]     Social Determinants of Health ([place]) Interventions    Readmission Risk Interventions Readmission Risk Prevention Plan 01/18/2021  Transportation Screening Complete  PCP or Specialist Appt within 5-7 Days Complete  Home Care Screening Complete  Medication Review (RN CM) Complete  Some recent data might be hidden

## 2021-01-19 ENCOUNTER — Other Ambulatory Visit: Payer: Medicare Other

## 2021-01-19 LAB — COMPREHENSIVE METABOLIC PANEL
ALT: 22 U/L (ref 0–44)
AST: 24 U/L (ref 15–41)
Albumin: 2.3 g/dL — ABNORMAL LOW (ref 3.5–5.0)
Alkaline Phosphatase: 93 U/L (ref 38–126)
Anion gap: 9 (ref 5–15)
BUN: 25 mg/dL — ABNORMAL HIGH (ref 8–23)
CO2: 30 mmol/L (ref 22–32)
Calcium: 8.4 mg/dL — ABNORMAL LOW (ref 8.9–10.3)
Chloride: 108 mmol/L (ref 98–111)
Creatinine, Ser: 1.22 mg/dL (ref 0.61–1.24)
GFR, Estimated: 55 mL/min — ABNORMAL LOW (ref >60)
Glucose, Bld: 122 mg/dL — ABNORMAL HIGH (ref 70–99)
Potassium: 3.2 mmol/L — ABNORMAL LOW (ref 3.5–5.1)
Sodium: 147 mmol/L — ABNORMAL HIGH (ref 135–145)
Total Bilirubin: 0.8 mg/dL (ref 0.3–1.2)
Total Protein: 5.7 g/dL — ABNORMAL LOW (ref 6.5–8.1)

## 2021-01-19 LAB — CBC
HCT: 27.7 % — ABNORMAL LOW (ref 39.0–52.0)
Hemoglobin: 9.6 g/dL — ABNORMAL LOW (ref 13.0–17.0)
MCH: 45.9 pg — ABNORMAL HIGH (ref 26.0–34.0)
MCHC: 34.7 g/dL (ref 30.0–36.0)
MCV: 132.5 fL — ABNORMAL HIGH (ref 80.0–100.0)
Platelets: 422 10*3/uL — ABNORMAL HIGH (ref 150–400)
RBC: 2.09 MIL/uL — ABNORMAL LOW (ref 4.22–5.81)
RDW: 14.4 % (ref 11.5–15.5)
WBC: 12.1 10*3/uL — ABNORMAL HIGH (ref 4.0–10.5)
nRBC: 0 % (ref 0.0–0.2)

## 2021-01-19 LAB — GLUCOSE, CAPILLARY: Glucose-Capillary: 124 mg/dL — ABNORMAL HIGH (ref 70–99)

## 2021-01-19 LAB — MAGNESIUM: Magnesium: 1.9 mg/dL (ref 1.7–2.4)

## 2021-01-19 MED ORDER — POTASSIUM CL IN DEXTROSE 5% 20 MEQ/L IV SOLN
20.0000 meq | INTRAVENOUS | Status: DC
  Administered 2021-01-19 – 2021-01-20 (×2): 20 meq via INTRAVENOUS
  Filled 2021-01-19 (×4): qty 1000

## 2021-01-19 MED ORDER — POTASSIUM CHLORIDE 10 MEQ/100ML IV SOLN
10.0000 meq | Freq: Once | INTRAVENOUS | Status: AC
  Administered 2021-01-19: 10 meq via INTRAVENOUS
  Filled 2021-01-19: qty 100

## 2021-01-19 MED ORDER — ACETAMINOPHEN 325 MG PO TABS
650.0000 mg | ORAL_TABLET | Freq: Four times a day (QID) | ORAL | Status: DC | PRN
  Administered 2021-01-20 – 2021-01-24 (×4): 650 mg via ORAL
  Filled 2021-01-19 (×4): qty 2

## 2021-01-19 MED ORDER — POTASSIUM CHLORIDE 10 MEQ/100ML IV SOLN
10.0000 meq | INTRAVENOUS | Status: AC
  Administered 2021-01-19 (×2): 10 meq via INTRAVENOUS
  Filled 2021-01-19 (×2): qty 100

## 2021-01-19 NOTE — Progress Notes (Addendum)
PROGRESS NOTE     Lee Mercado   WIO:[id number]  DOB: [birth year]  PCP: Danae Orleans, MD    DOA: 01/15/2021 LOS: 5   Brief Narrative   87 yr old male [facility] resident, history of paroxysmal A. fib CHADS2 score >3 on Eliquis, HTN, HLD, essential thrombocytosis (hydroxyurea), macrocytic anemia, chronic dizziness and vertigo, history of a CVA. Recent admit 4/4 through 01/04/2021 - hypoxic respiratory failure 2/2 bronchitis-presumed prior smoker.  Returned to ED via EMS on 4/18 with SOB, dizziness, chronic cough, disoriented.  Found to have WBC 17 BNP 274 troponin 181. Failed swallow eval in ED, is aspirating.   CT head negative except for chronic microvascular ischemic changes, generalized atrophy and multiple old cerebellar infarcts. CXR showed low lung volumes with vascular crowding and streaky basilar atelectasis.  Admitted and started on IV antibiotics for presumed aspiration PNA.   Initially on Rocephin azithromycin >>>Unasyn.  Palliative care consulted for goals of care given patient's advanced age, failure to thrive, and now aspirating.    Patient developed GI bleeding with BRBPR and melena on 4/20.  Heparin drip was stopped.  Given tenuous respiratory status with aspiration, not currently candidate for endoscopic evaluations.  Hemoglobin trending stable thus far.      Assessment & Plan   Principal Problem:   Aspiration pneumonia ([place]) Active Problems:   Acute respiratory failure with hypoxia (HCC)   Protein-calorie malnutrition, severe   GI bleeding   Dysphagia   GI bleeding - resolved with holding anticoagulation. RN staff report BRBPR and melena AM of 4/20.   Hbg is stable and actually increasing.  Trend H&H  Transfuse pRBC's if Hbg < 7.0   GI consulted, pt would not be candidate for endoscopic evaluation at this time due to respiratory status  Tagged RBC scan cancelled as bleeding has stopped   Hold anticoagulants  Dysphagia / Aspirating  Appreciate  SLP's involvement and recommendations  Aspiration precautions   Lower extremity edema - suspect due to getting fluids and Lasix held.  He is otherwise dehydrated, sodium up.  This is likely third-spacing edema.    Monitor for now  Leg wraps or TED hose, compression  Check BNP with AM labs  Continue holding Lasix for now  Elevate legs, compression stockings  Hypernatremia - Na 147>>146>>147.  Inadequate PO intake due to aspiration.  Resumed on D5w+Kcl. Monitor BMP.    Sepsis Ruled Out - only SIRS criteria met was leukocytosis.  Aspiration pneumonia/pneumonitis Speech therapy input appreciated DNR now Continue Unasyn  Severe hypokalemia - resolved, recurred.  Replacing.  Monitor BMP and Mg levels  Replace further as needed  Demand ischemia Troponin trend flat-EKG no overt changes although does have fascicular blocks Appreciate cardiology input-not a candidate for invasive eval Echocardiogram deferred No reported chest pain on my exam with him today  Atrial fibrillation CHADS2 score >4 on Eliquis Continue heparin GTT  Chronic HFpEF - compensated, stable. EF 55-60% 11/11/2017 Holding Lasix 20 Resume Toprol-XL 12.5 twice daily  Essential Thrombocytosis, Macrocytic Anemia Continue Hydrea folic acid etc. Outpatient eval with hematology as per them  Adult failure to thrive recurrent hospitalizations within 2 months Appreciate palliative care expertise-patient now DNR Discussions to continue, time for outcomes    Patient BMI: Body mass index is 27.4 kg/m.   DVT prophylaxis: SCDs Start: 01/15/21 1337   Diet:  Diet Orders (From admission, onward)    Start     Ordered   01/16/21 1131  DIET - DYS 1 Room service appropriate?  Yes with Assist; Fluid consistency: Nectar Thick  Diet effective now       Comments: No Straws.  Extra Gravy on meats, potatoes. Yogurt, pudding, Magic Cup, Oatmeal w/ sugar/butters(per speech) at meals.  Question Answer Comment  Room service  appropriate? Yes with Assist   Fluid consistency: Nectar Thick      01/16/21 1132            Code Status: Partial Code    Subjective 01/20/21    Pt was sleeping but woke easily to voice.  Says he tried to have a BM but couldn't.  Coughed up some thick yellow phlegm with some orange material in it that was pocketed food or meds.  Says he is cold, was uncovered.     Disposition Plan & Communication   Status is: Inpatient  Remains inpatient appropriate because:Inpatient level of care appropriate due to severity of illness, ongoing evaluation, on IV antibiotics, aspirating, inadequate PO intake   Dispo:  Patient From: [place]  Planned Disposition: Home  Medically stable for discharge: No     Family Communication: updated son, Dominica Severin, by phone 4/21   Consults, Procedures, Significant Events   Consultants:   Palliative Care  SLP  Procedures:   None  Antimicrobials:  Anti-infectives (From admission, onward)   Start     Dose/Rate Route Frequency Ordered Stop   01/16/21 0000  Ampicillin-Sulbactam (UNASYN) 3 g in sodium chloride 0.9 % 100 mL IVPB        3 g 200 mL/hr over 30 Minutes Intravenous Every 6 hours 01/15/21 2304     01/15/21 1245  cefTRIAXone (ROCEPHIN) 1 g in sodium chloride 0.9 % 100 mL IVPB        1 g 200 mL/hr over 30 Minutes Intravenous  Once 01/15/21 1237 01/15/21 1410   01/15/21 1245  azithromycin (ZITHROMAX) 500 mg in sodium chloride 0.9 % 250 mL IVPB        500 mg 250 mL/hr over 60 Minutes Intravenous  Once 01/15/21 1237 01/15/21 1514        Micro    Objective   Vitals:   01/20/21 0020 01/20/21 0409 01/20/21 0500 01/20/21 0745  BP: 137/78 126/71  128/74  Pulse: 77 66  73  Resp: '16 18  20  ' Temp: 98.4 F (36.9 C) 98.7 F (37.1 C)  98.3 F (36.8 C)  TempSrc: Axillary Axillary  Axillary  SpO2: 98% 94%  94%  Weight:   89.1 kg   Height:        Intake/Output Summary (Last 24 hours) at 01/20/2021 0753 Last data filed at  01/20/2021 0556 Gross per 24 hour  Intake 1355.68 ml  Output 400 ml  Net 955.68 ml   Filed Weights   01/18/21 0500 01/19/21 0500 01/20/21 0500  Weight: 87.5 kg 87.2 kg 89.1 kg    Physical Exam:  General exam: sleeping but woke easily to voice, no acute distress Respiratory system: referred upper airway secretion sounds, no wheezes, normal respiratory effort, coughed up thick yellow sputum with some organ-colored material. Cardiovascular system: normal S1/S2, RRR, 2+ lower extremity edema.   Gastrointestinal system: soft, non-tender, hypoactive bowel sounds Neuro: normal speech, follows commands, grossly non-focal exam  Labs   Data Reviewed: I have personally reviewed following labs and imaging studies  CBC: Recent Labs  Lab 01/16/21 0107 01/17/21 0540 01/17/21 2349 01/18/21 0103 01/18/21 0654 01/18/21 1346 01/19/21 0427 01/20/21 0335  WBC 15.1* 15.0*  --  15.5*  --   --  12.1* 12.5*  NEUTROABS 12.2*  --   --   --   --   --   --   --   HGB 10.1* 9.5*   < > 8.8* 9.3* 10.2* 9.6* 9.2*  HCT 28.5* 27.6*   < > 25.7* 27.0* 29.9* 27.7* 26.4*  MCV 128.4* 130.2*  --  133.9*  --   --  132.5* 131.3*  PLT 374 396  --  401*  --   --  422* 414*   < > = values in this interval not displayed.   Basic Metabolic Panel: Recent Labs  Lab 01/16/21 0107 01/17/21 0540 01/18/21 0103 01/19/21 0427 01/20/21 0335  NA 142 147* 146* 147* 144  K 2.9* 3.6 3.6 3.2* 3.4*  CL 103 110 108 108 104  CO2 '29 31 30 30 30  ' GLUCOSE 144* 163* 130* 122* 125*  BUN 26* 22 20 25* 23  CREATININE 1.10 1.07 1.02 1.22 1.30*  CALCIUM 8.2* 8.5* 8.4* 8.4* 8.4*  MG 1.9  --   --  1.9  --   PHOS 2.4*  --   --   --   --    GFR: Estimated Creatinine Clearance: 37 mL/min (A) (by C-G formula based on SCr of 1.3 mg/dL (H)). Liver Function Tests: Recent Labs  Lab 01/15/21 1215 01/16/21 0107 01/17/21 0540 01/18/21 0103 01/19/21 0427  AST '22 18 24 28 24  ' ALT '18 14 18 20 22  ' ALKPHOS 102 86 78 96 93  BILITOT 1.4*  0.9 0.7 0.8 0.8  PROT 6.8 5.8* 5.4* 5.4* 5.7*  ALBUMIN 3.0* 2.5* 2.3* 2.2* 2.3*   No results for input(s): LIPASE, AMYLASE in the last 168 hours. No results for input(s): AMMONIA in the last 168 hours. Coagulation Profile: Recent Labs  Lab 01/16/21 0349  INR 1.9*   Cardiac Enzymes: No results for input(s): CKTOTAL, CKMB, CKMBINDEX, TROPONINI in the last 168 hours. BNP (last 3 results) No results for input(s): PROBNP in the last 8760 hours. HbA1C: No results for input(s): HGBA1C in the last 72 hours. CBG: Recent Labs  Lab 01/19/21 1627  GLUCAP 124*   Lipid Profile: No results for input(s): CHOL, HDL, LDLCALC, TRIG, CHOLHDL, LDLDIRECT in the last 72 hours. Thyroid Function Tests: No results for input(s): TSH, T4TOTAL, FREET4, T3FREE, THYROIDAB in the last 72 hours. Anemia Panel: No results for input(s): VITAMINB12, FOLATE, FERRITIN, TIBC, IRON, RETICCTPCT in the last 72 hours. Sepsis Labs: Recent Labs  Lab 01/15/21 1902 01/18/21 0103  PROCALCITON  --  0.19  LATICACIDVEN 1.4  --     Recent Results (from the past 240 hour(s))  Resp Panel by RT-PCR (Flu A&B, Covid) Nasopharyngeal Swab     Status: None   Collection Time: 01/15/21 12:12 PM   Specimen: Nasopharyngeal Swab; Nasopharyngeal(NP) swabs in vial transport medium  Result Value Ref Range Status   SARS Coronavirus 2 by RT PCR NEGATIVE NEGATIVE Final    Comment: (NOTE) SARS-CoV-2 target nucleic acids are NOT DETECTED.  The SARS-CoV-2 RNA is generally detectable in upper respiratory specimens during the acute phase of infection. The lowest concentration of SARS-CoV-2 viral copies this assay can detect is 138 copies/mL. A negative result does not preclude SARS-Cov-2 infection and should not be used as the sole basis for treatment or other patient management decisions. A negative result may occur with  improper specimen collection/handling, submission of specimen other than nasopharyngeal swab, presence of viral  mutation(s) within the areas targeted by this assay, and inadequate number of viral  copies(<138 copies/mL). A negative result must be combined with clinical observations, patient history, and epidemiological information. The expected result is Negative.  Fact Sheet for Patients:  EntrepreneurPulse.com.au  Fact Sheet for Healthcare Providers:  IncredibleEmployment.be  This test is no t yet approved or cleared by the [place] FDA and  has been authorized for detection and/or diagnosis of SARS-CoV-2 by FDA under an Emergency Use Authorization (EUA). This EUA will remain  in effect (meaning this test can be used) for the duration of the COVID-19 declaration under Section 564(b)(1) of the Act, 21 U.S.C.section 360bbb-3(b)(1), unless the authorization is terminated  or revoked sooner.       Influenza A by PCR NEGATIVE NEGATIVE Final   Influenza B by PCR NEGATIVE NEGATIVE Final    Comment: (NOTE) The Xpert Xpress SARS-CoV-2/FLU/RSV plus assay is intended as an aid in the diagnosis of influenza from Nasopharyngeal swab specimens and should not be used as a sole basis for treatment. Nasal washings and aspirates are unacceptable for Xpert Xpress SARS-CoV-2/FLU/RSV testing.  Fact Sheet for Patients: EntrepreneurPulse.com.au  Fact Sheet for Healthcare Providers: IncredibleEmployment.be  This test is not yet approved or cleared by the [place] FDA and has been authorized for detection and/or diagnosis of SARS-CoV-2 by FDA under an Emergency Use Authorization (EUA). This EUA will remain in effect (meaning this test can be used) for the duration of the COVID-19 declaration under Section 564(b)(1) of the Act, 21 U.S.C. section 360bbb-3(b)(1), unless the authorization is terminated or revoked.  Performed at [facility], [address] [place], [place] [address]   Blood culture (routine x 2)      Status: None   Collection Time: 01/15/21 12:15 PM   Specimen: BLOOD  Result Value Ref Range Status   Specimen Description BLOOD BLOOD RIGHT HAND  Final   Special Requests   Final    BOTTLES DRAWN AEROBIC AND ANAEROBIC Blood Culture adequate volume   Culture   Final    NO GROWTH 5 DAYS Performed at [facility], [address], [place], [place] [address]    Report Status 01/20/2021 FINAL  Final  Blood culture (routine x 2)     Status: None   Collection Time: 01/15/21  7:02 PM   Specimen: BLOOD  Result Value Ref Range Status   Specimen Description BLOOD LEFT ANTECUBITAL  Final   Special Requests   Final    BOTTLES DRAWN AEROBIC AND ANAEROBIC Blood Culture results may not be optimal due to an inadequate volume of blood received in culture bottles   Culture   Final    NO GROWTH 5 DAYS Performed at [facility], [address] [place], [place] [address]    Report Status 01/20/2021 FINAL  Final      Imaging Studies   No results found.   Medications   Scheduled Meds: . feeding supplement (NEPRO CARB STEADY)  237 mL Oral Q24H  . hydroxyurea  500 mg Oral Daily  . levothyroxine  50 mcg Oral Daily  . metoprolol tartrate  12.5 mg Oral BID  . multivitamin with minerals  1 tablet Oral Daily   Continuous Infusions: . sodium chloride 500 mL (01/19/21 2138)  . ampicillin-sulbactam (UNASYN) IV Stopped (01/20/21 0327)  . dextrose 5 % with KCl 20 mEq / L 75 mL/hr at 01/20/21 0650       LOS: 5 days    Time spent: 25 minutes with > 50% spent at bedside and in coordination of care.  Ezekiel Slocumb, DO [facility]  01/20/2021, 7:53 AM      If 7PM-7AM, please contact night-coverage. How to contact the [facility] Attending or Consulting provider [place] or covering provider during after hours [place], for this patient?    1. Check the care team in [facility] and look for a) attending/consulting [facility] provider listed and b) the [facility] team listed 2. Log into  [email or web address] and use 's universal password to access. If you do not have the password, please contact the hospital operator. 3. Locate the [facility] provider you are looking for under [facility] and page to a number that you can be directly reached. 4. If you still have difficulty reaching the provider, please page the [facility] (Director on Call) for the Hospitalists listed on amion for assistance.

## 2021-01-19 NOTE — Progress Notes (Signed)
Pharmacy Antibiotic Note  Lee Mercado is a 87 y.o. male admitted on 01/15/2021 with aspiration pneumonia.  Pharmacy has been consulted for Unasyn dosing.  Plan:  Unasyn 3gm q6h per indication Crcl 39.4 ml/min Pharmacy will continue to follow SCr and adjust abx dosing when warranted.  Height: 5\' 11"  (180.3 cm) Weight: 87.2 kg (192 lb 3.9 oz) IBW/kg (Calculated) : 75.3  Temp (24hrs), Avg:98.3 F (36.8 C), Min:97.8 F (36.6 C), Max:99.3 F (37.4 C)  Recent Labs  Lab 01/15/21 1215 01/15/21 1902 01/16/21 0107 01/17/21 0540 01/18/21 0103 01/19/21 0427  WBC 17.1*  --  15.1* 15.0* 15.5* 12.1*  CREATININE 1.09  --  1.10 1.07 1.02 1.22  LATICACIDVEN  --  1.4  --   --   --   --     Estimated Creatinine Clearance: 39.4 mL/min (by C-G formula based on SCr of 1.22 mg/dL).    No Known Allergies  Antimicrobials this admission: 4/18 Azithromycin >> x 1 4/18 Ceftriaxone >> x 1 4/18 Unasyn(evening)  >>   Microbiology results: 4/18 BCx: NG x 4d  Thank you for allowing pharmacy to be a part of this patient's care.  Chinita Greenland PharmD Clinical Pharmacist 01/19/2021

## 2021-01-19 NOTE — Progress Notes (Signed)
OT Cancellation Note  Patient Details Name: Lee Mercado MRN: [id number] DOB: 1925-05-11   Cancelled Treatment:    Reason Eval/Treat Not Completed: Fatigue/lethargy limiting ability to participate. Pt sleeping soundly upon attempt. Will re-attempt at later date/time as appropriate.   Hanley Hays, MPH, MS, OTR/L ascom [phone] 01/19/21, 4:01 PM

## 2021-01-19 NOTE — Progress Notes (Signed)
PT Cancellation Note  Patient Details Name: Lee Mercado MRN: [id number] DOB: [birth year]   Cancelled Treatment:     PT attempt. Pt was asleep upon arriving. He easily awakes for a few seconds prior to drifting back to sleep. Pt has barely touched lunch tray at bedside. Attempted to wake pt and motivate to participate however pt states," I just want to go home and be left alone." Acute PT will continue to follow and progress per POC.    Willette Pa 01/19/2021, 3:17 PM

## 2021-01-20 DIAGNOSIS — J690 Pneumonitis due to inhalation of food and vomit: Principal | ICD-10-CM

## 2021-01-20 LAB — CULTURE, BLOOD (ROUTINE X 2)
Culture: NO GROWTH
Culture: NO GROWTH
Special Requests: ADEQUATE

## 2021-01-20 LAB — BASIC METABOLIC PANEL
Anion gap: 10 (ref 5–15)
BUN: 23 mg/dL (ref 8–23)
CO2: 30 mmol/L (ref 22–32)
Calcium: 8.4 mg/dL — ABNORMAL LOW (ref 8.9–10.3)
Chloride: 104 mmol/L (ref 98–111)
Creatinine, Ser: 1.3 mg/dL — ABNORMAL HIGH (ref 0.61–1.24)
GFR, Estimated: 51 mL/min — ABNORMAL LOW (ref >60)
Glucose, Bld: 125 mg/dL — ABNORMAL HIGH (ref 70–99)
Potassium: 3.4 mmol/L — ABNORMAL LOW (ref 3.5–5.1)
Sodium: 144 mmol/L (ref 135–145)

## 2021-01-20 LAB — CBC
HCT: 26.4 % — ABNORMAL LOW (ref 39.0–52.0)
Hemoglobin: 9.2 g/dL — ABNORMAL LOW (ref 13.0–17.0)
MCH: 45.8 pg — ABNORMAL HIGH (ref 26.0–34.0)
MCHC: 34.8 g/dL (ref 30.0–36.0)
MCV: 131.3 fL — ABNORMAL HIGH (ref 80.0–100.0)
Platelets: 414 10*3/uL — ABNORMAL HIGH (ref 150–400)
RBC: 2.01 MIL/uL — ABNORMAL LOW (ref 4.22–5.81)
RDW: 14.3 % (ref 11.5–15.5)
WBC: 12.5 10*3/uL — ABNORMAL HIGH (ref 4.0–10.5)
nRBC: 0 % (ref 0.0–0.2)

## 2021-01-20 LAB — BRAIN NATRIURETIC PEPTIDE: B Natriuretic Peptide: 587.6 pg/mL — ABNORMAL HIGH (ref 0.0–100.0)

## 2021-01-20 MED ORDER — DEXTROSE-NACL 5-0.45 % IV SOLN: INTRAVENOUS | Status: DC

## 2021-01-20 NOTE — Progress Notes (Addendum)
PROGRESS NOTE     Lee Mercado   JWJ:[id number]  DOB: 1925-03-07  PCP: Danae Orleans, MD    DOA: 01/15/2021 LOS: 5   Brief Narrative   87 yr old male [facility] resident, history of paroxysmal A. fib CHADS2 score >3 on Eliquis, HTN, HLD, essential thrombocytosis (hydroxyurea), macrocytic anemia, chronic dizziness and vertigo, history of a CVA. Recent admit 4/4 through 01/04/2021 - hypoxic respiratory failure 2/2 bronchitis-presumed prior smoker.  Returned to ED via EMS on 4/18 with SOB, dizziness, chronic cough, disoriented.  Found to have WBC 17 BNP 274 troponin 181. Failed swallow eval in ED, is aspirating.   CT head negative except for chronic microvascular ischemic changes, generalized atrophy and multiple old cerebellar infarcts. CXR showed low lung volumes with vascular crowding and streaky basilar atelectasis.  Admitted and started on IV antibiotics for presumed aspiration PNA.   Initially on Rocephin azithromycin >>>Unasyn.  Palliative care consulted for goals of care given patient's advanced age, failure to thrive, and now aspirating.    Patient developed GI bleeding with BRBPR and melena on 4/20.  Heparin drip was stopped.  Given tenuous respiratory status with aspiration, not currently candidate for endoscopic evaluations.  Hemoglobin trend stable and bleeding resolved with stopping anticoagulation.  4/23: Patient continues to have dysphagia and high risk for aspiration, although respiratory status has been stable.  Requiring IV hydration for hypernatremia and worsening renal function both secondary to inability to orally hydrate.    Assessment & Plan   Principal Problem:   Aspiration pneumonia ([place]) Active Problems:   Acute respiratory failure with hypoxia (HCC)   Protein-calorie malnutrition, severe   GI bleeding   Dysphagia   GI bleeding - resolved with holding anticoagulation. RN staff report BRBPR and melena AM of 4/20.   Hbg is stable and  improved.  Trend H&H  Transfuse pRBC's if Hbg < 7.0   GI consulted, pt would not be candidate for endoscopic evaluation at this time due to respiratory status  Tagged RBC scan cancelled as bleeding stopped   Hold anticoagulants  Dysphagia / Aspirating /inadequate p.o. intake  Appreciate SLP's involvement and recommendations  Diet: Dysphagia 1 with nectar thick liquids  Aspiration precautions, assist with meals, supervise all oral intake  Patient may have ice chips or spoons of water for pleasure  Acute kidney injury - Cr 1.30 on 4/23, due to poor oral intake. --Change D5 fluids >> D5 1/2-NS with KCl   Lower extremity edema - suspect due to getting fluids and Lasix held.  He is otherwise dehydrated, sodium up.  This is likely third-spacing edema.    Monitor for now  Leg wraps or TED hose, compression  Check BNP with AM labs  Continue holding Lasix for now  Elevate legs, compression stockings  Hypernatremia - Na 147>>146>>147.  Inadequate PO intake due to aspiration.  On D5-1/2NS as above. Monitor BMP.    Hypokalemia - replacing in IV fluids.  Monitor BMP, replace as needed  Sepsis Ruled Out - only SIRS criteria met was leukocytosis.  Aspiration pneumonia/pneumonitis Speech therapy input appreciated DNR now Continue Unasyn  Severe hypokalemia - resolved, recurred.  Replacing.  Monitor BMP and Mg levels  Replace further as needed  Demand ischemia Troponin trend flat-EKG no overt changes although does have fascicular blocks Appreciate cardiology input-not a candidate for invasive eval Echocardiogram deferred No reported chest pain on my exam with him today  Atrial fibrillation CHADS2 score >4 on Eliquis Continue heparin GTT  Chronic HFpEF -  compensated, stable. EF 55-60% 11/11/2017 Holding Lasix 20 Resume Toprol-XL 12.5 twice daily  Essential Thrombocytosis, Macrocytic Anemia Continue Hydrea folic acid etc. Outpatient eval with hematology as per them  Adult failure  to thrive recurrent hospitalizations within 2 months Appreciate palliative care expertise-patient now DNR Discussions to continue, time for outcomes     Patient BMI: Body mass index is 27.4 kg/m.   DVT prophylaxis: SCDs Start: 01/15/21 1337   Diet:  Diet Orders (From admission, onward)    Start     Ordered   01/16/21 1131  DIET - DYS 1 Room service appropriate? Yes with Assist; Fluid consistency: Nectar Thick  Diet effective now       Comments: No Straws.  Extra Gravy on meats, potatoes. Yogurt, pudding, Magic Cup, Oatmeal w/ sugar/butters(per speech) at meals.  Question Answer Comment  Room service appropriate? Yes with Assist   Fluid consistency: Nectar Thick      01/16/21 1132            Code Status: Partial Code    Subjective 01/20/21    Patient was sleeping reclined in the bed, woke easily to voice and interacts but does not open his eyes.  He is asking for salt repeatedly.  Says he does not like thick liquids.  I given small spoon of water and saw no issues with swallow but some of it stayed pocket in his mouth which he eventually swallowed with prompting.  Reports having some gas pains.   Disposition Plan & Communication   Status is: Inpatient  Remains inpatient appropriate because:Inpatient level of care appropriate due to severity of illness, ongoing evaluation, on IV antibiotics, aspirating, inadequate PO intake   Dispo:  Patient From: [place]  Planned Disposition: Home  Medically stable for discharge: No     Family Communication: updated son, Dominica Severin, by phone 4/21   Consults, Procedures, Significant Events   Consultants:   Palliative Care  SLP  Procedures:   None  Antimicrobials:  Anti-infectives (From admission, onward)   Start     Dose/Rate Route Frequency Ordered Stop   01/16/21 0000  Ampicillin-Sulbactam (UNASYN) 3 g in sodium chloride 0.9 % 100 mL IVPB        3 g 200 mL/hr over 30 Minutes Intravenous Every 6 hours  01/15/21 2304     01/15/21 1245  cefTRIAXone (ROCEPHIN) 1 g in sodium chloride 0.9 % 100 mL IVPB        1 g 200 mL/hr over 30 Minutes Intravenous  Once 01/15/21 1237 01/15/21 1410   01/15/21 1245  azithromycin (ZITHROMAX) 500 mg in sodium chloride 0.9 % 250 mL IVPB        500 mg 250 mL/hr over 60 Minutes Intravenous  Once 01/15/21 1237 01/15/21 1514        Micro    Objective   Vitals:   01/20/21 0409 01/20/21 0500 01/20/21 0745 01/20/21 1145  BP: 126/71  128/74 138/61  Pulse: 66  73 64  Resp: _0 Temp: 98.7 F (37.1 C)  98.3 F (36.8 C) 98 F (36.7 C)  TempSrc: Axillary  Axillary Axillary  SpO2: 94%  94% 90%  Weight:  89.1 kg    Height:        Intake/Output Summary (Last 24 hours) at 01/20/2021 1514 Last data filed at 01/20/2021 1450 Gross per 24 hour  Intake 1856.26 ml  Output 400 ml  Net 1456.26 ml   Filed Weights   01/18/21 0500 01/19/21 0500  01/20/21 0500  Weight: 87.5 kg 87.2 kg 89.1 kg    Physical Exam:  General exam: sleeping but woke easily to voice, no acute distress, keeps eyes closed while awake Respiratory system: CTAB with diminished bases, no wheezes, normal respiratory effort. Cardiovascular system: normal S1/S2, RRR, 2+ lower extremity edema, worse in the feet L>R.   Gastrointestinal system: soft, non-tender Neuro: normal speech but very low volume/projection, follows commands, grip strength 5/5 bilaterally  Labs   Data Reviewed: I have personally reviewed following labs and imaging studies  CBC: Recent Labs  Lab 01/16/21 0107 01/17/21 0540 01/17/21 2349 01/18/21 0103 01/18/21 0654 01/18/21 1346 01/19/21 0427 01/20/21 0335  WBC 15.1* 15.0*  --  15.5*  --   --  12.1* 12.5*  NEUTROABS 12.2*  --   --   --   --   --   --   --   HGB 10.1* 9.5*   < > 8.8* 9.3* 10.2* 9.6* 9.2*  HCT 28.5* 27.6*   < > 25.7* 27.0* 29.9* 27.7* 26.4*  MCV 128.4* 130.2*  --  133.9*  --   --  132.5* 131.3*  PLT 374 396  --  401*  --   --  422* 414*   < > =  values in this interval not displayed.   Basic Metabolic Panel: Recent Labs  Lab 01/16/21 0107 01/17/21 0540 01/18/21 0103 01/19/21 0427 01/20/21 0335  NA 142 147* 146* 147* 144  K 2.9* 3.6 3.6 3.2* 3.4*  CL 103 110 108 108 104  CO2 _0 GLUCOSE 144* 163* 130* 122* 125*  BUN 26* 22 20 25* 23  CREATININE 1.10 1.07 1.02 1.22 1.30*  CALCIUM 8.2* 8.5* 8.4* 8.4* 8.4*  MG 1.9  --   --  1.9  --   PHOS 2.4*  --   --   --   --    GFR: Estimated Creatinine Clearance: 37 mL/min (A) (by C-G formula based on SCr of 1.3 mg/dL (H)). Liver Function Tests: Recent Labs  Lab 01/15/21 1215 01/16/21 0107 01/17/21 0540 01/18/21 0103 01/19/21 0427  AST _1 ALT _2 ALKPHOS 102 86 78 96 93  BILITOT 1.4* 0.9 0.7 0.8 0.8  PROT 6.8 5.8* 5.4* 5.4* 5.7*  ALBUMIN 3.0* 2.5* 2.3* 2.2* 2.3*   No results for input(s): LIPASE, AMYLASE in the last 168 hours. No results for input(s): AMMONIA in the last 168 hours. Coagulation Profile: Recent Labs  Lab 01/16/21 0349  INR 1.9*   Cardiac Enzymes: No results for input(s): CKTOTAL, CKMB, CKMBINDEX, TROPONINI in the last 168 hours. BNP (last 3 results) No results for input(s): PROBNP in the last 8760 hours. HbA1C: No results for input(s): HGBA1C in the last 72 hours. CBG: Recent Labs  Lab 01/19/21 1627  GLUCAP 124*   Lipid Profile: No results for input(s): CHOL, HDL, LDLCALC, TRIG, CHOLHDL, LDLDIRECT in the last 72 hours. Thyroid Function Tests: No results for input(s): TSH, T4TOTAL, FREET4, T3FREE, THYROIDAB in the last 72 hours. Anemia Panel: No results for input(s): VITAMINB12, FOLATE, FERRITIN, TIBC, IRON, RETICCTPCT in the last 72 hours. Sepsis Labs: Recent Labs  Lab 01/15/21 1902 01/18/21 0103  PROCALCITON  --  0.19  LATICACIDVEN 1.4  --     Recent Results (from the past 240 hour(s))  Resp Panel by RT-PCR (Flu A&B, Covid) Nasopharyngeal Swab     Status: None   Collection Time: 01/15/21 12:12 PM  Specimen: Nasopharyngeal Swab; Nasopharyngeal(NP) swabs in vial transport medium  Result Value Ref Range Status   SARS Coronavirus 2 by RT PCR NEGATIVE NEGATIVE Final    Comment: (NOTE) SARS-CoV-2 target nucleic acids are NOT DETECTED.  The SARS-CoV-2 RNA is generally detectable in upper respiratory specimens during the acute phase of infection. The lowest concentration of SARS-CoV-2 viral copies this assay can detect is 138 copies/mL. A negative result does not preclude SARS-Cov-2 infection and should not be used as the sole basis for treatment or other patient management decisions. A negative result may occur with  improper specimen collection/handling, submission of specimen other than nasopharyngeal swab, presence of viral mutation(s) within the areas targeted by this assay, and inadequate number of viral copies(<138 copies/mL). A negative result must be combined with clinical observations, patient history, and epidemiological information. The expected result is Negative.  Fact Sheet for Patients:  EntrepreneurPulse.com.au  Fact Sheet for Healthcare Providers:  IncredibleEmployment.be  This test is no t yet approved or cleared by the [place] FDA and  has been authorized for detection and/or diagnosis of SARS-CoV-2 by FDA under an Emergency Use Authorization (EUA). This EUA will remain  in effect (meaning this test can be used) for the duration of the COVID-19 declaration under Section 564(b)(1) of the Act, 21 U.S.C.section 360bbb-3(b)(1), unless the authorization is terminated  or revoked sooner.       Influenza A by PCR NEGATIVE NEGATIVE Final   Influenza B by PCR NEGATIVE NEGATIVE Final    Comment: (NOTE) The Xpert Xpress SARS-CoV-2/FLU/RSV plus assay is intended as an aid in the diagnosis of influenza from Nasopharyngeal swab specimens and should not be used as a sole basis for treatment. Nasal washings and aspirates are  unacceptable for Xpert Xpress SARS-CoV-2/FLU/RSV testing.  Fact Sheet for Patients: EntrepreneurPulse.com.au  Fact Sheet for Healthcare Providers: IncredibleEmployment.be  This test is not yet approved or cleared by the [place] FDA and has been authorized for detection and/or diagnosis of SARS-CoV-2 by FDA under an Emergency Use Authorization (EUA). This EUA will remain in effect (meaning this test can be used) for the duration of the COVID-19 declaration under Section 564(b)(1) of the Act, 21 U.S.C. section 360bbb-3(b)(1), unless the authorization is terminated or revoked.  Performed at [facility], [address] [place], [place] [address]   Blood culture (routine x 2)     Status: None   Collection Time: 01/15/21 12:15 PM   Specimen: BLOOD  Result Value Ref Range Status   Specimen Description BLOOD BLOOD RIGHT HAND  Final   Special Requests   Final    BOTTLES DRAWN AEROBIC AND ANAEROBIC Blood Culture adequate volume   Culture   Final    NO GROWTH 5 DAYS Performed at [facility], [address] [place], [place] [address]    Report Status 01/20/2021 FINAL  Final  Blood culture (routine x 2)     Status: None   Collection Time: 01/15/21  7:02 PM   Specimen: BLOOD  Result Value Ref Range Status   Specimen Description BLOOD LEFT ANTECUBITAL  Final   Special Requests   Final    BOTTLES DRAWN AEROBIC AND ANAEROBIC Blood Culture results may not be optimal due to an inadequate volume of blood received in culture bottles   Culture   Final    NO GROWTH 5 DAYS Performed at [facility], [address] [place], [place] [address]    Report Status 01/20/2021 FINAL  Final  Imaging Studies   No results found.   Medications   Scheduled Meds: . feeding supplement (NEPRO CARB STEADY)  237 mL Oral Q24H  . hydroxyurea  500 mg Oral Daily  . levothyroxine  50 mcg Oral Daily  . metoprolol tartrate  12.5  mg Oral BID  . multivitamin with minerals  1 tablet Oral Daily   Continuous Infusions: . sodium chloride 10 mL/hr at 01/20/21 0914  . ampicillin-sulbactam (UNASYN) IV 3 g (01/20/21 0920)  . dextrose 5 % and 0.45% NaCl 75 mL/hr at 01/20/21 0917       LOS: 5 days    Time spent: 25 minutes with > 50% spent at bedside and in coordination of care.    Ezekiel Slocumb, DO [facility]  01/20/2021, 3:14 PM      If 7PM-7AM, please contact night-coverage. How to contact the [facility] Attending or Consulting provider [place] or covering provider during after hours [place], for this patient?    1. Check the care team in [facility] and look for a) attending/consulting [facility] provider listed and b) the [facility] team listed 2. Log into [email or web address] and use [place]'s universal password to access. If you do not have the password, please contact the hospital operator. 3. Locate the [facility] provider you are looking for under [facility] and page to a number that you can be directly reached. 4. If you still have difficulty reaching the provider, please page the [facility] (Director on Call) for the Hospitalists listed on amion for assistance.

## 2021-01-21 ENCOUNTER — Inpatient Hospital Stay: Payer: Medicare Other

## 2021-01-21 LAB — BASIC METABOLIC PANEL
Anion gap: 8 (ref 5–15)
BUN: 21 mg/dL (ref 8–23)
CO2: 27 mmol/L (ref 22–32)
Calcium: 8.1 mg/dL — ABNORMAL LOW (ref 8.9–10.3)
Chloride: 105 mmol/L (ref 98–111)
Creatinine, Ser: 1.04 mg/dL (ref 0.61–1.24)
GFR, Estimated: >60 mL/min (ref >60)
Glucose, Bld: 104 mg/dL — ABNORMAL HIGH (ref 70–99)
Potassium: 3.5 mmol/L (ref 3.5–5.1)
Sodium: 140 mmol/L (ref 135–145)

## 2021-01-21 LAB — BRAIN NATRIURETIC PEPTIDE: B Natriuretic Peptide: 337.2 pg/mL — ABNORMAL HIGH (ref 0.0–100.0)

## 2021-01-21 LAB — MRSA PCR SCREENING: MRSA by PCR: NEGATIVE

## 2021-01-21 NOTE — Progress Notes (Signed)
Physical Therapy Treatment Patient Details Name: Lee Mercado MRN: [id number] DOB: [birth year] Today's Date: 01/21/2021    History of Present Illness Patient is a 87 y.o. male with medical history significant for essential thrombocytosis, macrocytic anemia, hypothyroidism, CVA, paroxysmal A. fib on Eliquis, chronic dizziness and vertigo who presented to [facility] ED from SNF due to shortness of breath, productive cough, and hypoxemia, 86% on room air. Recent hospital discharge to SNF about 2 weeks ago after admit for acute bronchitis. Patient thought to have possible aspiration pneumonia    PT Comments    Patient is more alert today that previous session. Patient continues to have severe generalized weakness and needs total assistance for rolling and repositioning in bed. Patient does participate with LE exercises for strengthening in bed. Patient educated on routine repositioning to prevent against skin breakdown.  Recommend to continue PT as patient able to participate in attempts to maximize independence as patient reports his goal is to get stronger. SNF will be needed at discharge.     Follow Up Recommendations  SNF     Equipment Recommendations  None recommended by PT    Recommendations for Other Services       Precautions / Restrictions Precautions Precautions: Fall Restrictions Weight Bearing Restrictions: No    Mobility  Bed Mobility Overal bed mobility: Needs Assistance Bed Mobility: Rolling Rolling: Total assist         General bed mobility comments: unable to roll to right side with one person assistance. patient required total assistance of one person for rolling to left side. verbal cues for technique and faciliation provided for reaching across midline    Transfers                    Ambulation/Gait                 Stairs             Wheelchair Mobility    Modified Rankin (Stroke Patients Only)       Balance                                             Cognition Arousal/Alertness: Awake/alert Behavior During Therapy: WFL for tasks assessed/performed Overall Cognitive Status: No family/caregiver present to determine baseline cognitive functioning                                 General Comments: patient is oriented to place and person. patient seems more alert this session compared to evaluation. patient able to follow single step commands with extra time      Exercises General Exercises - Lower Extremity Ankle Circles/Pumps: AAROM;Strengthening;Both;15 reps;Supine Heel Slides: AAROM;Strengthening;Both;15 reps;Supine Hip ABduction/ADduction: AAROM;Strengthening;Both;15 reps;Supine Other Exercises Other Exercises: verbal and visual cues for technique for strengthening exercises. patient requesting bed pan for bowel movement after exercises. assisted patient with rolling and noted patient already had bowel movement. alerted NA    General Comments        Pertinent Vitals/Pain Pain Assessment: No/denies pain    Home Living                      Prior Function            PT Goals (current goals can now be found in the care  plan section) Acute Rehab PT Goals Patient Stated Goal: to get stronger PT Goal Formulation: With patient Time For Goal Achievement: 01/30/21 Potential to Achieve Goals: Fair Progress towards PT goals: Progressing toward goals    Frequency    Min 2X/week      PT Plan Current plan remains appropriate    Co-evaluation              AM-PAC PT "6 Clicks" Mobility   Outcome Measure  Help needed turning from your back to your side while in a flat bed without using bedrails?: Total Help needed moving from lying on your back to sitting on the side of a flat bed without using bedrails?: Total Help needed moving to and from a bed to a chair (including a wheelchair)?: Total Help needed standing up from a chair using your arms (e.g.,  wheelchair or bedside chair)?: Total Help needed to walk in hospital room?: Total Help needed climbing 3-5 steps with a railing? : Total 6 Click Score: 6    End of Session   Activity Tolerance: Patient limited by fatigue Patient left: in bed;with call bell/phone within reach;with bed alarm set Nurse Communication: Mobility status (alerted nurse aide about bowel movement) PT Visit Diagnosis: Unsteadiness on feet (R26.81);Muscle weakness (generalized) (M62.81);Difficulty in walking, not elsewhere classified (R26.2)     Time: 4128-7867 PT Time Calculation (min) (ACUTE ONLY): 23 min  Charges:  $Therapeutic Exercise: 8-22 mins $Therapeutic Activity: 8-22 mins                     Minna Merritts, PT, MPT    Lee Mercado 01/21/2021, 1:15 PM

## 2021-01-21 NOTE — Progress Notes (Signed)
Urine output today 1102ml via external catheter.  Bladder scan showed 218ml.  Will notify night RN to continue to monitor.

## 2021-01-21 NOTE — Progress Notes (Signed)
PROGRESS NOTE     Lee Mercado   GQB:[id number]  DOB: 1924/12/07  PCP: Danae Orleans, MD    DOA: 01/15/2021 LOS: 6   Brief Narrative   87 yr old male [facility] resident, history of paroxysmal A. fib CHADS2 score >3 on Eliquis, HTN, HLD, essential thrombocytosis (hydroxyurea), macrocytic anemia, chronic dizziness and vertigo, history of a CVA. Recent admit 4/4 through 01/04/2021 - hypoxic respiratory failure 2/2 bronchitis-presumed prior smoker.  Returned to ED via EMS on 4/18 with SOB, dizziness, chronic cough, disoriented.  Found to have WBC 17 BNP 274 troponin 181. Failed swallow eval in ED, is aspirating.   CT head negative except for chronic microvascular ischemic changes, generalized atrophy and multiple old cerebellar infarcts. CXR showed low lung volumes with vascular crowding and streaky basilar atelectasis.  Admitted and started on IV antibiotics for presumed aspiration PNA.   Initially on Rocephin azithromycin >>>Unasyn.  Palliative care consulted for goals of care given patient's advanced age, failure to thrive, and now aspirating.    Patient developed GI bleeding with BRBPR and melena on 4/20.  Heparin drip was stopped.  Given tenuous respiratory status with aspiration, not currently candidate for endoscopic evaluations.  Hemoglobin trend stable and bleeding resolved with stopping anticoagulation.  4/23: Patient continues to have dysphagia and high risk for aspiration, although respiratory status has been stable.  Requiring IV hydration for hypernatremia and worsening renal function both secondary to inability to orally hydrate.    Assessment & Plan   Principal Problem:   Aspiration pneumonia ([place]) Active Problems:   Acute respiratory failure with hypoxia (HCC)   Protein-calorie malnutrition, severe   GI bleeding   Dysphagia   GI bleeding - resolved with holding anticoagulation. RN staff report BRBPR and melena AM of 4/20.   Hbg is stable and  improved.  Trend H&H  Transfuse pRBC's if Hbg < 7.0   GI consulted, pt would not be candidate for endoscopic evaluation at this time due to respiratory status  Tagged RBC scan cancelled as bleeding stopped   Hold anticoagulants  Dysphagia / Aspirating /inadequate p.o. intake  Appreciate SLP's involvement and recommendations  Diet: Dysphagia 1 with nectar thick liquids  Aspiration precautions, assist with meals, supervise all oral intake  Patient may have ice chips or spoons of water for pleasure  Acute kidney injury - Cr 1.30 on 4/23, due to poor oral intake. --Change D5 fluids >> D5 1/2-NS with KCl   Lower extremity edema - suspect due to getting fluids and Lasix held.  He is otherwise dehydrated, sodium up.  This is likely third-spacing edema.    Monitor for now  Leg wraps or TED hose, compression  Check BNP with AM labs  Continue holding Lasix for now  Elevate legs, compression stockings  Hypernatremia - Resolved with d5w.  Due to inadequate PO intake due to aspiration.   Na 147>>146>>147>>144>>140.  -- Stop fluids and monitor  -- Monitor BMP.    Hypokalemia - replacing in IV fluids.  Monitor BMP, replace as needed  Sepsis Ruled Out - only SIRS criteria met was leukocytosis.  Aspiration pneumonia/pneumonitis Speech therapy input appreciated DNR now Stop Unasyn after today's doses - 7 days completed  Monitor fever curve and respiratory status  Severe hypokalemia - resolved, recurred.  Replacing.  Monitor BMP and Mg levels  Replace further as needed  Demand ischemia Troponin trend flat-EKG no overt changes although does have fascicular blocks Appreciate cardiology input-not a candidate for invasive eval Echocardiogram deferred No  reported chest pain on my exam with him today  Atrial fibrillation CHADS2 score >4 on Eliquis Continue heparin GTT  Chronic HFpEF - compensated, stable. EF 55-60% 11/11/2017 Holding Lasix 20 Resume Toprol-XL 12.5 twice daily  Essential  Thrombocytosis, Macrocytic Anemia Continue Hydrea folic acid etc. Outpatient eval with hematology as per them  Adult failure to thrive recurrent hospitalizations within 2 months Appreciate palliative care expertise-patient now DNR Discussions to continue, time for outcomes      Patient BMI: Body mass index is 27.09 kg/m.   DVT prophylaxis: SCDs Start: 01/15/21 1337   Diet:  Diet Orders (From admission, onward)    Start     Ordered   01/16/21 1131  DIET - DYS 1 Room service appropriate? Yes with Assist; Fluid consistency: Nectar Thick  Diet effective now       Comments: No Straws.  Extra Gravy on meats, potatoes. Yogurt, pudding, Magic Cup, Oatmeal w/ sugar/butters(per speech) at meals.  Question Answer Comment  Room service appropriate? Yes with Assist   Fluid consistency: Nectar Thick      01/16/21 1132            Code Status: Partial Code    Subjective 01/21/21    Patient was awake in bed when seen today.  Asks for some water/ice chips and did better when I gave him those today.  He denies any acute copmlaints   Disposition Plan & Communication   Status is: Inpatient  Remains inpatient appropriate because:Inpatient level of care appropriate due to severity of illness, ongoing evaluation, on IV antibiotics, aspirating, inadequate PO intake   Dispo:  Patient From: [facility]  Planned Disposition: Home  Medically stable for discharge: No     Family Communication: updated son, Dominica Severin, by phone 4/21   Consults, Procedures, Significant Events   Consultants:   Palliative Care  SLP  Procedures:   None  Antimicrobials:  Anti-infectives (From admission, onward)   Start     Dose/Rate Route Frequency Ordered Stop   01/16/21 0000  Ampicillin-Sulbactam (UNASYN) 3 g in sodium chloride 0.9 % 100 mL IVPB        3 g 200 mL/hr over 30 Minutes Intravenous Every 6 hours 01/15/21 2304 01/22/21 0259   01/15/21 1245  cefTRIAXone (ROCEPHIN) 1 g in sodium  chloride 0.9 % 100 mL IVPB        1 g 200 mL/hr over 30 Minutes Intravenous  Once 01/15/21 1237 01/15/21 1410   01/15/21 1245  azithromycin (ZITHROMAX) 500 mg in sodium chloride 0.9 % 250 mL IVPB        500 mg 250 mL/hr over 60 Minutes Intravenous  Once 01/15/21 1237 01/15/21 1514        Micro    Objective   Vitals:   01/21/21 0500 01/21/21 0828 01/21/21 1018 01/21/21 1158  BP:  (!) 141/118 (!) 153/61 118/64  Pulse:  66 65 (!) 57  Resp:  '20 19 18  ' Temp:  98.4 F (36.9 C)  98.2 F (36.8 C)  TempSrc:  Oral  Oral  SpO2:  96% 95% 90%  Weight: 88.1 kg     Height:        Intake/Output Summary (Last 24 hours) at 01/21/2021 1336 Last data filed at 01/21/2021 1111 Gross per 24 hour  Intake 1765.5 ml  Output 700 ml  Net 1065.5 ml   Filed Weights   01/19/21 0500 01/20/21 0500 01/21/21 0500  Weight: 87.2 kg 89.1 kg 88.1 kg    Physical  Exam:  General exam: awake laying in bed, no acute distress, opens eyes today Respiratory system: CTAB, no wheezes, normal respiratory effort, on room air. Cardiovascular system: normal S1/S2, RRR, 2+ lower extremity edema, worse in the feet L>R.   Gastrointestinal system: soft, non-tender, +bowel sounds Neuro: normal speech but very low volume/projection, grossly nonfocal exam  Labs   Data Reviewed: I have personally reviewed following labs and imaging studies  CBC: Recent Labs  Lab 01/16/21 0107 01/17/21 0540 01/17/21 2349 01/18/21 0103 01/18/21 0654 01/18/21 1346 01/19/21 0427 01/20/21 0335  WBC 15.1* 15.0*  --  15.5*  --   --  12.1* 12.5*  NEUTROABS 12.2*  --   --   --   --   --   --   --   HGB 10.1* 9.5*   < > 8.8* 9.3* 10.2* 9.6* 9.2*  HCT 28.5* 27.6*   < > 25.7* 27.0* 29.9* 27.7* 26.4*  MCV 128.4* 130.2*  --  133.9*  --   --  132.5* 131.3*  PLT 374 396  --  401*  --   --  422* 414*   < > = values in this interval not displayed.   Basic Metabolic Panel: Recent Labs  Lab 01/16/21 0107 01/17/21 0540 01/18/21 0103  01/19/21 0427 01/20/21 0335 01/21/21 0412  NA 142 147* 146* 147* 144 140  K 2.9* 3.6 3.6 3.2* 3.4* 3.5  CL 103 110 108 108 104 105  CO2 '29 31 30 30 30 27  ' GLUCOSE 144* 163* 130* 122* 125* 104*  BUN 26* 22 20 25* 23 21  CREATININE 1.10 1.07 1.02 1.22 1.30* 1.04  CALCIUM 8.2* 8.5* 8.4* 8.4* 8.4* 8.1*  MG 1.9  --   --  1.9  --   --   PHOS 2.4*  --   --   --   --   --    GFR: Estimated Creatinine Clearance: 46.3 mL/min (by C-G formula based on SCr of 1.04 mg/dL). Liver Function Tests: Recent Labs  Lab 01/15/21 1215 01/16/21 0107 01/17/21 0540 01/18/21 0103 01/19/21 0427  AST '22 18 24 28 24  ' ALT '18 14 18 20 22  ' ALKPHOS 102 86 78 96 93  BILITOT 1.4* 0.9 0.7 0.8 0.8  PROT 6.8 5.8* 5.4* 5.4* 5.7*  ALBUMIN 3.0* 2.5* 2.3* 2.2* 2.3*   No results for input(s): LIPASE, AMYLASE in the last 168 hours. No results for input(s): AMMONIA in the last 168 hours. Coagulation Profile: Recent Labs  Lab 01/16/21 0349  INR 1.9*   Cardiac Enzymes: No results for input(s): CKTOTAL, CKMB, CKMBINDEX, TROPONINI in the last 168 hours. BNP (last 3 results) No results for input(s): PROBNP in the last 8760 hours. HbA1C: No results for input(s): HGBA1C in the last 72 hours. CBG: Recent Labs  Lab 01/19/21 1627  GLUCAP 124*   Lipid Profile: No results for input(s): CHOL, HDL, LDLCALC, TRIG, CHOLHDL, LDLDIRECT in the last 72 hours. Thyroid Function Tests: No results for input(s): TSH, T4TOTAL, FREET4, T3FREE, THYROIDAB in the last 72 hours. Anemia Panel: No results for input(s): VITAMINB12, FOLATE, FERRITIN, TIBC, IRON, RETICCTPCT in the last 72 hours. Sepsis Labs: Recent Labs  Lab 01/15/21 1902 01/18/21 0103  PROCALCITON  --  0.19  LATICACIDVEN 1.4  --     Recent Results (from the past 240 hour(s))  Resp Panel by RT-PCR (Flu A&B, Covid) Nasopharyngeal Swab     Status: None   Collection Time: 01/15/21 12:12 PM   Specimen: Nasopharyngeal Swab; Nasopharyngeal(NP) swabs  in vial transport  medium  Result Value Ref Range Status   SARS Coronavirus 2 by RT PCR NEGATIVE NEGATIVE Final    Comment: (NOTE) SARS-CoV-2 target nucleic acids are NOT DETECTED.  The SARS-CoV-2 RNA is generally detectable in upper respiratory specimens during the acute phase of infection. The lowest concentration of SARS-CoV-2 viral copies this assay can detect is 138 copies/mL. A negative result does not preclude SARS-Cov-2 infection and should not be used as the sole basis for treatment or other patient management decisions. A negative result may occur with  improper specimen collection/handling, submission of specimen other than nasopharyngeal swab, presence of viral mutation(s) within the areas targeted by this assay, and inadequate number of viral copies(<138 copies/mL). A negative result must be combined with clinical observations, patient history, and epidemiological information. The expected result is Negative.  Fact Sheet for Patients:  EntrepreneurPulse.com.au  Fact Sheet for Healthcare Providers:  IncredibleEmployment.be  This test is no t yet approved or cleared by the [place] FDA and  has been authorized for detection and/or diagnosis of SARS-CoV-2 by FDA under an Emergency Use Authorization (EUA). This EUA will remain  in effect (meaning this test can be used) for the duration of the COVID-19 declaration under Section 564(b)(1) of the Act, 21 U.S.C.section 360bbb-3(b)(1), unless the authorization is terminated  or revoked sooner.       Influenza A by PCR NEGATIVE NEGATIVE Final   Influenza B by PCR NEGATIVE NEGATIVE Final    Comment: (NOTE) The Xpert Xpress SARS-CoV-2/FLU/RSV plus assay is intended as an aid in the diagnosis of influenza from Nasopharyngeal swab specimens and should not be used as a sole basis for treatment. Nasal washings and aspirates are unacceptable for Xpert Xpress SARS-CoV-2/FLU/RSV testing.  Fact Sheet for  Patients: EntrepreneurPulse.com.au  Fact Sheet for Healthcare Providers: IncredibleEmployment.be  This test is not yet approved or cleared by the [place] FDA and has been authorized for detection and/or diagnosis of SARS-CoV-2 by FDA under an Emergency Use Authorization (EUA). This EUA will remain in effect (meaning this test can be used) for the duration of the COVID-19 declaration under Section 564(b)(1) of the Act, 21 U.S.C. section 360bbb-3(b)(1), unless the authorization is terminated or revoked.  Performed at [facility], [address] [place], [place] [address]   Blood culture (routine x 2)     Status: None   Collection Time: 01/15/21 12:15 PM   Specimen: BLOOD  Result Value Ref Range Status   Specimen Description BLOOD BLOOD RIGHT HAND  Final   Special Requests   Final    BOTTLES DRAWN AEROBIC AND ANAEROBIC Blood Culture adequate volume   Culture   Final    NO GROWTH 5 DAYS Performed at [facility], [address], [place], [place] [address]    Report Status 01/20/2021 FINAL  Final  Blood culture (routine x 2)     Status: None   Collection Time: 01/15/21  7:02 PM   Specimen: BLOOD  Result Value Ref Range Status   Specimen Description BLOOD LEFT ANTECUBITAL  Final   Special Requests   Final    BOTTLES DRAWN AEROBIC AND ANAEROBIC Blood Culture results may not be optimal due to an inadequate volume of blood received in culture bottles   Culture   Final    NO GROWTH 5 DAYS Performed at [facility], [address], [place], [place] [address]    Report Status 01/20/2021 FINAL  Final      Imaging Studies  DG Chest Port 1 View  Result Date: 01/21/2021 CLINICAL DATA:  Increased cough, shortness of breath. Recent aspiration pneumonia. EXAM: PORTABLE CHEST 1 VIEW COMPARISON:  01/15/2021 FINDINGS: Normal heart size. Scratch set stable cardiomediastinal contours. Low lung volumes. Small pleural  effusions and pulmonary vascular congestion. Opacity noted within the left base compatible with aspiration or pneumonia. IMPRESSION: 1. Left base opacity compatible with aspiration or pneumonia. 2. Small bilateral pleural effusions and pulmonary vascular congestion. Electronically Signed   By: Kerby Moors M.D.   On: 01/21/2021 08:38     Medications   Scheduled Meds: . feeding supplement (NEPRO CARB STEADY)  237 mL Oral Q24H  . hydroxyurea  500 mg Oral Daily  . levothyroxine  50 mcg Oral Daily  . metoprolol tartrate  12.5 mg Oral BID  . multivitamin with minerals  1 tablet Oral Daily   Continuous Infusions: . sodium chloride 10 mL/hr at 01/21/21 0818  . ampicillin-sulbactam (UNASYN) IV 3 g (01/21/21 1039)  . dextrose 5 % and 0.45% NaCl 50 mL/hr at 01/21/21 0818       LOS: 6 days    Time spent: 25 minutes with > 50% spent at bedside and in coordination of care.    Ezekiel Slocumb, DO [facility]  01/21/2021, 1:36 PM      If 7PM-7AM, please contact night-coverage. How to contact the [facility] Attending or Consulting provider [place] or covering provider during after hours [place], for this patient?    1. Check the care team in [facility] and look for a) attending/consulting TR[facility] provider listed and b) the [facility] team listed 2. Log into [email or web address] and use [place]'s universal password to access. If you do not have the password, please contact the hospital operator. 3. Locate the [facility] provider you are looking for under [facility] and page to a number that you can be directly reached. 4. If you still have difficulty reaching the provider, please page the [facility] (Director on Call) for the Hospitalists listed on amion for assistance.

## 2021-01-22 LAB — BASIC METABOLIC PANEL
Anion gap: 6 (ref 5–15)
BUN: 18 mg/dL (ref 8–23)
CO2: 28 mmol/L (ref 22–32)
Calcium: 7.9 mg/dL — ABNORMAL LOW (ref 8.9–10.3)
Chloride: 105 mmol/L (ref 98–111)
Creatinine, Ser: 1.08 mg/dL (ref 0.61–1.24)
GFR, Estimated: >60 mL/min (ref >60)
Glucose, Bld: 108 mg/dL — ABNORMAL HIGH (ref 70–99)
Potassium: 3.5 mmol/L (ref 3.5–5.1)
Sodium: 139 mmol/L (ref 135–145)

## 2021-01-22 LAB — CBC
HCT: 27.5 % — ABNORMAL LOW (ref 39.0–52.0)
Hemoglobin: 9.6 g/dL — ABNORMAL LOW (ref 13.0–17.0)
MCH: 45.1 pg — ABNORMAL HIGH (ref 26.0–34.0)
MCHC: 34.9 g/dL (ref 30.0–36.0)
MCV: 129.1 fL — ABNORMAL HIGH (ref 80.0–100.0)
Platelets: 465 10*3/uL — ABNORMAL HIGH (ref 150–400)
RBC: 2.13 MIL/uL — ABNORMAL LOW (ref 4.22–5.81)
RDW: 13.8 % (ref 11.5–15.5)
WBC: 11.1 10*3/uL — ABNORMAL HIGH (ref 4.0–10.5)
nRBC: 0 % (ref 0.0–0.2)

## 2021-01-22 LAB — MAGNESIUM: Magnesium: 1.9 mg/dL (ref 1.7–2.4)

## 2021-01-22 MED ORDER — FUROSEMIDE 20 MG PO TABS
20.0000 mg | ORAL_TABLET | Freq: Every day | ORAL | Status: DC
  Administered 2021-01-23 – 2021-01-24 (×2): 20 mg via ORAL
  Filled 2021-01-22 (×2): qty 1

## 2021-01-22 MED ORDER — FUROSEMIDE 20 MG PO TABS
20.0000 mg | ORAL_TABLET | Freq: Two times a day (BID) | ORAL | Status: AC
  Administered 2021-01-22 (×2): 20 mg via ORAL
  Filled 2021-01-22 (×2): qty 1

## 2021-01-22 MED ORDER — POTASSIUM CHLORIDE CRYS ER 20 MEQ PO TBCR
40.0000 meq | EXTENDED_RELEASE_TABLET | Freq: Once | ORAL | Status: AC
  Administered 2021-01-22: 14:00:00 40 meq via ORAL
  Filled 2021-01-22: qty 2

## 2021-01-22 NOTE — Progress Notes (Signed)
PALLIATIVE NOTE:   Message left with son with a goal to continue with ongoing goals of care discussions. Nutrition remains poor with poor overall prognosis.   Alda Lea, AGPCNP-BC Palliative Medicine Team  Phone: [phone]  No Charge

## 2021-01-22 NOTE — Progress Notes (Incomplete)
   Daily Progress Note   Patient Name: Lee Mercado       Date: 01/22/2021 DOB: 1925-06-19  Age: 87 y.o. MRN#: [id number] Attending Physician: Ezekiel Slocumb, DO Primary Care Physician: Danae Orleans, MD Admit Date: 01/15/2021  Reason for Consultation/Follow-up: Establishing goals of care  Subjective: Reviewed.  Updates received. All questions answered and support provided.  Length of Stay: 7 days  Vital Signs: BP 135/64 (BP Location: Left Arm)   Pulse 63   Temp 98.3 F (36.8 C)   Resp 18   Ht 5\' 11"  (1.803 m)   Wt 85.8 kg   SpO2 97%   BMI 26.38 kg/m  SpO2: SpO2: 97 % O2 Device: O2 Device: Room Air O2 Flow Rate: O2 Flow Rate (L/min): 2 L/min  Physical Exam: Awake, alert, chronically ill appearing RRR Rhonchi, congested cough Mood appropriate           Palliative Care Assessment & Plan    Code Status:  Limited code No CPR   Goals of Care/Recommendations:  Continue to treat the treatable  Family remaining hopeful for some stability but also preparing for further decline, no improvement. Would not want patient to suffer or undergo life-prolonging measures for long period. Would be more open to focus on his comfort in the future with understanding of high risk of sudden decline and complications related to continued aspiration. Does not think they would want artificial feeding tube which was recommended against.   Family hopeful for continued rehabilitation as they are encouraged by his awake and alertness on today.   Outpatient Palliative support at discharge.   PMT will continue to support and follow as needed. Please contact team line or by secure chat with urgent needs.   Prognosis: Guarded-Poor   Discharge Planning: [place] for rehab with Palliative care service follow-up  Thank you for allowing the Palliative Medicine Team to assist in the care of this patient.  Time Total: 65 min.   Visit consisted of counseling and education  dealing with the complex and emotionally intense issues of symptom management and palliative care in the setting of serious and potentially life-threatening illness.Greater than 50%  of this time was spent counseling and coordinating care related to the above assessment and plan.  Alda Lea, AGPCNP-BC  Palliative Medicine Team [phone]

## 2021-01-22 NOTE — Progress Notes (Signed)
Occupational Therapy Treatment Patient Details Name: Lee Mercado MRN: [id number] DOB: Apr 23, 1925 Today's Date: 01/22/2021    History of present illness Patient is a 87 y.o. male with medical history significant for essential thrombocytosis, macrocytic anemia, hypothyroidism, CVA, paroxysmal A. fib on Eliquis, chronic dizziness and vertigo who presented to [facility] ED from SNF due to shortness of breath, productive cough, and hypoxemia, 86% on room air. Recent hospital discharge to SNF about 2 weeks ago after admit for acute bronchitis. Patient thought to have possible aspiration pneumonia   OT comments  Mr Hageman was seen for OT treatment on this date. Upon arrival to room pt reclined in bed, agreeable to tx. Pt requires MAX A sup<>sit. MIN A for oral hygiene seated EOB decreasing to MOD A as pt appeared to lose interest in performing. Initially required CGA + single UE support static sitting EOB decreasing to MIN A as pt fatigued 2/2 R lateral lean. Pt repeats "Dear Reita Cliche, help me." t/o session, intermittently agrees to food/oral care however does not accept bites of meal.  Pt tolerated 15 mins sitting EOB. MAX A don B socks at EOB.   Pt making good progress toward goals. Pt continues to benefit from skilled OT services to maximize return to PLOF and minimize risk of future falls, injury, caregiver burden, and readmission. Will continue to follow POC. Discharge recommendation remains appropriate.    Follow Up Recommendations  SNF    Equipment Recommendations  Other (comment) (defer)    Recommendations for Other Services      Precautions / Restrictions Precautions Precautions: Fall Restrictions Weight Bearing Restrictions: No       Mobility Bed Mobility Overal bed mobility: Needs Assistance Bed Mobility: Supine to Sit;Sit to Supine     Supine to sit: Max assist Sit to supine: Max assist        Transfers Overall transfer level: Needs assistance   Transfers: Lateral/Scoot  Transfers          Lateral/Scoot Transfers: Total assist General transfer comment: follows cues to lean forward, TOTAL A to scoot x2 along EOB    Balance Overall balance assessment: Needs assistance Sitting-balance support: Single extremity supported;Feet supported Sitting balance-Leahy Scale: Fair Sitting balance - Comments: poor balance initially progressing to fair with increased sitting time. close stand by assistance required for safety                                   ADL either performed or assessed with clinical judgement   ADL Overall ADL's : Needs assistance/impaired                                       General ADL Comments: MIN A for oral hygiene seated EOB decreasing to MOD A as pt appeared to lose interest in performing. Initially required CGA + single UE support static sitting EOB decreasing to MIN A as pt fatigued 2/2 R lateral lean. Pt tolerated 15 mins sitting EOB. MAX A don B socks at EOB.               Cognition Arousal/Alertness: Awake/alert Behavior During Therapy: WFL for tasks assessed/performed Overall Cognitive Status: No family/caregiver present to determine baseline cognitive functioning  General Comments: Pt repeats "Dear Reita Cliche, help me." t/o session, intermittently agrees to food/oral care however does not accept bites of meal        Exercises Exercises: Other exercises Other Exercises Other Exercises: Pt educated re: OT role, DME recs, d/c recs, falls prevention, importance of nutrition for improved mobility Other Exercises: LBD, self -feeding, oral care, sup<>sit, sitting balance/tolerance, lateral scoot t/f   Shoulder Instructions       General Comments +3 pitting edema B feet    Pertinent Vitals/ Pain       Pain Assessment: No/denies pain         Frequency  Min 1X/week        Progress Toward Goals  OT Goals(current goals can now be found in the  care plan section)  Progress towards OT goals: Progressing toward goals  Acute Rehab OT Goals Patient Stated Goal: to get stronger OT Goal Formulation: With patient Time For Goal Achievement: 01/30/21 Potential to Achieve Goals: Fair ADL Goals Pt Will Perform Grooming: with min guard assist;bed level Pt Will Perform Upper Body Bathing: with mod assist;sitting Pt Will Perform Upper Body Dressing: with set-up;sitting Pt Will Perform Lower Body Dressing: with mod assist;sit to/from stand Pt Will Transfer to Toilet: with mod assist  Plan Discharge plan remains appropriate;Frequency remains appropriate       AM-PAC OT "6 Clicks" Daily Activity     Outcome Measure   Help from another person eating meals?: A Little Help from another person taking care of personal grooming?: A Lot Help from another person toileting, which includes using toliet, bedpan, or urinal?: A Lot Help from another person bathing (including washing, rinsing, drying)?: A Lot Help from another person to put on and taking off regular upper body clothing?: A Lot Help from another person to put on and taking off regular lower body clothing?: A Lot 6 Click Score: 13    End of Session    OT Visit Diagnosis: Unsteadiness on feet (R26.81);Muscle weakness (generalized) (M62.81);History of falling (Z91.81)   Activity Tolerance Patient tolerated treatment well   Patient Left in bed;with call bell/phone within reach;with bed alarm set   Nurse Communication Mobility status        Time: 6333-5456 OT Time Calculation (min): 29 min  Charges: OT General Charges $OT Visit: 1 Visit OT Treatments $Self Care/Home Management : 23-37 mins  Dessie Coma, M.S. OTR/L  01/22/21, 10:32 AM  ascom [phone]

## 2021-01-22 NOTE — Progress Notes (Signed)
PROGRESS NOTE     Lee Mercado   PTW:[id number]  DOB: 1925-05-17  PCP: Danae Orleans, MD    DOA: 01/15/2021 LOS: 7   Brief Narrative   87 yr old male [facility] resident, history of paroxysmal A. fib CHADS2 score >3 on Eliquis, HTN, HLD, essential thrombocytosis (hydroxyurea), macrocytic anemia, chronic dizziness and vertigo, history of a CVA. Recent admit 4/4 through 01/04/2021 - hypoxic respiratory failure 2/2 bronchitis-presumed prior smoker.  Returned to ED via EMS on 4/18 with SOB, dizziness, chronic cough, disoriented.  Found to have WBC 17 BNP 274 troponin 181. Failed swallow eval in ED, is aspirating.   CT head negative except for chronic microvascular ischemic changes, generalized atrophy and multiple old cerebellar infarcts. CXR showed low lung volumes with vascular crowding and streaky basilar atelectasis.  Admitted and started on IV antibiotics for presumed aspiration PNA.   Initially on Rocephin azithromycin >>>Unasyn.  Palliative care consulted for goals of care given patient's advanced age, failure to thrive, and now aspirating.    Patient developed GI bleeding with BRBPR and melena on 4/20.  Heparin drip was stopped.  Given tenuous respiratory status with aspiration, not currently candidate for endoscopic evaluations.  Hemoglobin trend stable and bleeding resolved with stopping anticoagulation.  4/23-25: Patient continues to have dysphagia and high risk for aspiration, although respiratory status has been stable.  Requiring IV hydration for hypernatremia and worsening renal function both secondary to inability to orally hydrate.  Fluids d/c'd 4/25 to assess trend of labs to assist with prognosis and decision making in [place].     Assessment & Plan   Principal Problem:   Aspiration pneumonia ([place]) Active Problems:   Acute respiratory failure with hypoxia (HCC)   Protein-calorie malnutrition, severe   GI bleeding   Dysphagia   GI bleeding - resolved with  holding anticoagulation. RN staff report BRBPR and melena AM of 4/20.   Hbg is stable and improved.  Trend H&H  Transfuse pRBC's if Hbg < 7.0   GI consulted, pt would not be candidate for endoscopic evaluation at this time due to respiratory status  Tagged RBC scan cancelled as bleeding stopped   Hold anticoagulants  Dysphagia / Aspirating /inadequate p.o. intake  Appreciate SLP's involvement and recommendations  Diet: Dysphagia 1 with nectar thick liquids  Aspiration precautions, assist with meals, supervise all oral intake  Patient may have ice chips or spoons of water for pleasure  Acute kidney injury - resolved with hydration. Cr 1.30 on 4/23, due to poor oral intake. --STOP fluids and monitor   Lower extremity edema - suspect due to getting fluids and Lasix held.  He is otherwise dehydrated, sodium up.  This is likely third-spacing edema.    Monitor for now  Leg wraps or TED hose, compression  Check BNP with AM labs  Continue holding Lasix for now  Elevate legs, compression stockings  Hypernatremia - Resolved with d5w.  Due to inadequate PO intake due to aspiration.   Na 147>>146>>147>>144>>140.  -- Stop fluids and monitor  -- Monitor BMP.    Hypokalemia - replacing in IV fluids.  Monitor BMP, replace as needed  Sepsis Ruled Out - only SIRS criteria met was leukocytosis.  Aspiration pneumonia/pneumonitis Speech therapy input appreciated DNR now Stop Unasyn after today's doses - 7 days completed  Monitor fever curve and respiratory status  Severe hypokalemia - resolved, recurred.  Replacing.  Monitor BMP and Mg levels  Replace further as needed  Demand ischemia Troponin trend flat-EKG no  overt changes although does have fascicular blocks Appreciate cardiology input-not a candidate for invasive eval Echocardiogram deferred No reported chest pain on my exam with him today  Atrial fibrillation CHADS2 score >4 on Eliquis Continue heparin GTT  Chronic HFpEF -  compensated, stable. EF 55-60% 11/11/2017 Resume home Lasix 20 given LE edema (4/25) Resume Toprol-XL 12.5 twice daily  Essential Thrombocytosis, Macrocytic Anemia Continue Hydrea folic acid etc. Outpatient eval with hematology as per them  Adult failure to thrive recurrent hospitalizations within 2 months Appreciate palliative care expertise-patient now DNR Discussions to continue, time for outcomes      Patient BMI: Body mass index is 26.38 kg/m.   DVT prophylaxis: SCDs Start: 01/15/21 1337   Diet:  Diet Orders (From admission, onward)    Start     Ordered   01/16/21 1131  DIET - DYS 1 Room service appropriate? Yes with Assist; Fluid consistency: Nectar Thick  Diet effective now       Comments: No Straws.  Extra Gravy on meats, potatoes. Yogurt, pudding, Magic Cup, Oatmeal w/ sugar/butters(per speech) at meals.  Question Answer Comment  Room service appropriate? Yes with Assist   Fluid consistency: Nectar Thick      01/16/21 1132            Code Status: Partial Code    Subjective 01/22/21    Patient was awake sitting up in bed.  His speech is still soft and mumbling, difficult to understand.  Legs hurt, may be due to swelling but unclear.  Denies feeling thirsty right now.   Disposition Plan & Communication   Status is: Inpatient  Remains inpatient appropriate because:Inpatient level of care appropriate due to severity of illness, ongoing evaluation, on IV antibiotics, aspirating, inadequate PO intake, ongoing goals of care discussion.   Dispo:  Patient From: [place]  Planned Disposition: [place]  Medically stable for discharge: No     Family Communication: updated son, Dominica Severin, by phone 4/21   Consults, Procedures, Significant Events   Consultants:   Palliative Care  SLP  Procedures:   None  Antimicrobials:  Anti-infectives (From admission, onward)   Start     Dose/Rate Route Frequency Ordered Stop   01/16/21  0000  Ampicillin-Sulbactam (UNASYN) 3 g in sodium chloride 0.9 % 100 mL IVPB        3 g 200 mL/hr over 30 Minutes Intravenous Every 6 hours 01/15/21 2304 01/21/21 2049   01/15/21 1245  cefTRIAXone (ROCEPHIN) 1 g in sodium chloride 0.9 % 100 mL IVPB        1 g 200 mL/hr over 30 Minutes Intravenous  Once 01/15/21 1237 01/15/21 1410   01/15/21 1245  azithromycin (ZITHROMAX) 500 mg in sodium chloride 0.9 % 250 mL IVPB        500 mg 250 mL/hr over 60 Minutes Intravenous  Once 01/15/21 1237 01/15/21 1514        Micro    Objective   Vitals:   01/22/21 0632 01/22/21 0733 01/22/21 1126 01/22/21 1545  BP: (!) 153/67 (!) 123/54 135/64 113/67  Pulse: 69 64 63 68  Resp: _0 Temp: 97.9 F (36.6 C) 98.3 F (36.8 C) 98.3 F (36.8 C) 97.7 F (36.5 C)  TempSrc: Axillary Oral    SpO2: 99% 95% 97% (!) 89%  Weight:      Height:        Intake/Output Summary (Last 24 hours) at 01/22/2021 1740 Last data filed at 01/22/2021 1621  Gross per 24 hour  Intake 156.63 ml  Output 202 ml  Net -45.37 ml   Filed Weights   01/20/21 0500 01/21/21 0500 01/22/21 0500  Weight: 89.1 kg 88.1 kg 85.8 kg    Physical Exam:  General exam: awake laying in bed, no acute distress, more alert and talkative today Respiratory system: CTAB with diminished bases, no wheezes, normal respiratory effort, on room air. Cardiovascular system: normal S1/S2, RRR, 2-3+ lower extremity edema, worse in the feet L>R.   Gastrointestinal system: soft, non-tender, +bowel sounds Neuro: very low volume/projection speech no slurring noted, grossly nonfocal exam  Labs   Data Reviewed: I have personally reviewed following labs and imaging studies  CBC: Recent Labs  Lab 01/16/21 0107 01/17/21 0540 01/17/21 2349 01/18/21 0103 01/18/21 0654 01/18/21 1346 01/19/21 0427 01/20/21 0335 01/22/21 0413  WBC 15.1* 15.0*  --  15.5*  --   --  12.1* 12.5* 11.1*  NEUTROABS 12.2*  --   --   --   --   --   --   --   --   HGB  10.1* 9.5*   < > 8.8* 9.3* 10.2* 9.6* 9.2* 9.6*  HCT 28.5* 27.6*   < > 25.7* 27.0* 29.9* 27.7* 26.4* 27.5*  MCV 128.4* 130.2*  --  133.9*  --   --  132.5* 131.3* 129.1*  PLT 374 396  --  401*  --   --  422* 414* 465*   < > = values in this interval not displayed.   Basic Metabolic Panel: Recent Labs  Lab 01/16/21 0107 01/17/21 0540 01/18/21 0103 01/19/21 0427 01/20/21 0335 01/21/21 0412 01/22/21 0413  NA 142   < > 146* 147* 144 140 139  K 2.9*   < > 3.6 3.2* 3.4* 3.5 3.5  CL 103   < > 108 108 104 105 105  CO2 29   < > _0 GLUCOSE 144*   < > 130* 122* 125* 104* 108*  BUN 26*   < > 20 25* _1 CREATININE 1.10   < > 1.02 1.22 1.30* 1.04 1.08  CALCIUM 8.2*   < > 8.4* 8.4* 8.4* 8.1* 7.9*  MG 1.9  --   --  1.9  --   --  1.9  PHOS 2.4*  --   --   --   --   --   --    < > = values in this interval not displayed.   GFR: Estimated Creatinine Clearance: 44.5 mL/min (by C-G formula based on SCr of 1.08 mg/dL). Liver Function Tests: Recent Labs  Lab 01/16/21 0107 01/17/21 0540 01/18/21 0103 01/19/21 0427  AST _2 ALT _3 ALKPHOS 86 78 96 93  BILITOT 0.9 0.7 0.8 0.8  PROT 5.8* 5.4* 5.4* 5.7*  ALBUMIN 2.5* 2.3* 2.2* 2.3*   No results for input(s): LIPASE, AMYLASE in the last 168 hours. No results for input(s): AMMONIA in the last 168 hours. Coagulation Profile: Recent Labs  Lab 01/16/21 0349  INR 1.9*   Cardiac Enzymes: No results for input(s): CKTOTAL, CKMB, CKMBINDEX, TROPONINI in the last 168 hours. BNP (last 3 results) No results for input(s): PROBNP in the last 8760 hours. HbA1C: No results for input(s): HGBA1C in the last 72 hours. CBG: Recent Labs  Lab 01/19/21 1627  GLUCAP 124*   Lipid Profile: No results for input(s): CHOL, HDL, LDLCALC, TRIG, CHOLHDL, LDLDIRECT in the last 72  hours. Thyroid Function Tests: No results for input(s): TSH, T4TOTAL, FREET4, T3FREE, THYROIDAB in the last 72 hours. Anemia Panel: No results  for input(s): VITAMINB12, FOLATE, FERRITIN, TIBC, IRON, RETICCTPCT in the last 72 hours. Sepsis Labs: Recent Labs  Lab 01/15/21 1902 01/18/21 0103  PROCALCITON  --  0.19  LATICACIDVEN 1.4  --     Recent Results (from the past 240 hour(s))  Resp Panel by RT-PCR (Flu A&B, Covid) Nasopharyngeal Swab     Status: None   Collection Time: 01/15/21 12:12 PM   Specimen: Nasopharyngeal Swab; Nasopharyngeal(NP) swabs in vial transport medium  Result Value Ref Range Status   SARS Coronavirus 2 by RT PCR NEGATIVE NEGATIVE Final    Comment: (NOTE) SARS-CoV-2 target nucleic acids are NOT DETECTED.  The SARS-CoV-2 RNA is generally detectable in upper respiratory specimens during the acute phase of infection. The lowest concentration of SARS-CoV-2 viral copies this assay can detect is 138 copies/mL. A negative result does not preclude SARS-Cov-2 infection and should not be used as the sole basis for treatment or other patient management decisions. A negative result may occur with  improper specimen collection/handling, submission of specimen other than nasopharyngeal swab, presence of viral mutation(s) within the areas targeted by this assay, and inadequate number of viral copies(<138 copies/mL). A negative result must be combined with clinical observations, patient history, and epidemiological information. The expected result is Negative.  Fact Sheet for Patients:  EntrepreneurPulse.com.au  Fact Sheet for Healthcare Providers:  IncredibleEmployment.be  This test is no t yet approved or cleared by the [place] FDA and  has been authorized for detection and/or diagnosis of SARS-CoV-2 by FDA under an Emergency Use Authorization (EUA). This EUA will remain  in effect (meaning this test can be used) for the duration of the COVID-19 declaration under Section 564(b)(1) of the Act, 21 U.S.C.section 360bbb-3(b)(1), unless the authorization is terminated  or  revoked sooner.       Influenza A by PCR NEGATIVE NEGATIVE Final   Influenza B by PCR NEGATIVE NEGATIVE Final    Comment: (NOTE) The Xpert Xpress SARS-CoV-2/FLU/RSV plus assay is intended as an aid in the diagnosis of influenza from Nasopharyngeal swab specimens and should not be used as a sole basis for treatment. Nasal washings and aspirates are unacceptable for Xpert Xpress SARS-CoV-2/FLU/RSV testing.  Fact Sheet for Patients: EntrepreneurPulse.com.au  Fact Sheet for Healthcare Providers: IncredibleEmployment.be  This test is not yet approved or cleared by the [place] FDA and has been authorized for detection and/or diagnosis of SARS-CoV-2 by FDA under an Emergency Use Authorization (EUA). This EUA will remain in effect (meaning this test can be used) for the duration of the COVID-19 declaration under Section 564(b)(1) of the Act, 21 U.S.C. section 360bbb-3(b)(1), unless the authorization is terminated or revoked.  Performed at [facility], [address] [place], [place] [address]   Blood culture (routine x 2)     Status: None   Collection Time: 01/15/21 12:15 PM   Specimen: BLOOD  Result Value Ref Range Status   Specimen Description BLOOD BLOOD RIGHT HAND  Final   Special Requests   Final    BOTTLES DRAWN AEROBIC AND ANAEROBIC Blood Culture adequate volume   Culture   Final    NO GROWTH 5 DAYS Performed at [facility], [address] [place], [place] [address]    Report Status 01/20/2021 FINAL  Final  Blood culture (routine x 2)     Status: None   Collection Time:  01/15/21  7:02 PM   Specimen: BLOOD  Result Value Ref Range Status   Specimen Description BLOOD LEFT ANTECUBITAL  Final   Special Requests   Final    BOTTLES DRAWN AEROBIC AND ANAEROBIC Blood Culture results may not be optimal due to an inadequate volume of blood received in culture bottles   Culture   Final    NO GROWTH 5 DAYS Performed  at [facility], [address] [place], [place] [address]    Report Status 01/20/2021 FINAL  Final  MRSA PCR Screening     Status: None   Collection Time: 01/21/21  3:57 PM   Specimen: Nasal Mucosa; Nasopharyngeal  Result Value Ref Range Status   MRSA by PCR NEGATIVE NEGATIVE Final    Comment:        The GeneXpert MRSA Assay (FDA approved for NASAL specimens only), is one component of a comprehensive MRSA colonization surveillance program. It is not intended to diagnose MRSA infection nor to guide or monitor treatment for MRSA infections. Performed at [facility], [address], [place], [place] [address]       Imaging Studies   DG Chest [place] 1 View  Result Date: 01/21/2021 CLINICAL DATA:  Increased cough, shortness of breath. Recent aspiration pneumonia. EXAM: PORTABLE CHEST 1 VIEW COMPARISON:  01/15/2021 FINDINGS: Normal heart size. Scratch set stable cardiomediastinal contours. Low lung volumes. Small pleural effusions and pulmonary vascular congestion. Opacity noted within the left base compatible with aspiration or pneumonia. IMPRESSION: 1. Left base opacity compatible with aspiration or pneumonia. 2. Small bilateral pleural effusions and pulmonary vascular congestion. Electronically Signed   By: Kerby Moors M.D.   On: 01/21/2021 08:38     Medications   Scheduled Meds: . feeding supplement (NEPRO CARB STEADY)  237 mL Oral Q24H  . [START ON 01/23/2021] furosemide  20 mg Oral Daily  . hydroxyurea  500 mg Oral Daily  . levothyroxine  50 mcg Oral Daily  . metoprolol tartrate  12.5 mg Oral BID  . multivitamin with minerals  1 tablet Oral Daily   Continuous Infusions: . sodium chloride 10 mL/hr at 01/21/21 0818       LOS: 7 days    Time spent: 25 minutes with > 50% spent at bedside and in coordination of care.    Ezekiel Slocumb, DO [facility]  01/22/2021, 5:40 PM      If 7PM-7AM, please contact night-coverage. How to  contact the [facility] Attending or Consulting provider [place] or covering provider during after hours [place], for this patient?    1. Check the care team in [facility] and look for a) attending/consulting TRH provider listed and b) the [facility] team listed 2. Log into [email or web address] and use [place]'s universal password to access. If you do not have the password, please contact the hospital operator. 3. Locate the [facility] provider you are looking for under [facility] and page to a number that you can be directly reached. 4. If you still have difficulty reaching the provider, please page the [facility] (Director on Call) for the Hospitalists listed on amion for assistance.

## 2021-01-23 LAB — BASIC METABOLIC PANEL
Anion gap: 7 (ref 5–15)
BUN: 16 mg/dL (ref 8–23)
CO2: 26 mmol/L (ref 22–32)
Calcium: 8.2 mg/dL — ABNORMAL LOW (ref 8.9–10.3)
Chloride: 105 mmol/L (ref 98–111)
Creatinine, Ser: 1.13 mg/dL (ref 0.61–1.24)
GFR, Estimated: >60 mL/min (ref >60)
Glucose, Bld: 95 mg/dL (ref 70–99)
Potassium: 3.9 mmol/L (ref 3.5–5.1)
Sodium: 138 mmol/L (ref 135–145)

## 2021-01-23 NOTE — Progress Notes (Addendum)
   Daily Progress Note   Patient Name: Lee Mercado       Date: 01/23/2021 DOB: December 08, 1924  Age: 87 y.o. MRN#: [id number] Attending Physician: Ezekiel Slocumb, DO Primary Care Physician: Danae Orleans, MD Admit Date: 01/15/2021  Reason for Consultation/Follow-up: Establishing goals of care  Subjective: Reviewed.  Updates received.  Patient is somnolent, easily awaken but quickly closes eyes.  No acute distress.  Speaks in a soft tone.  Will answer some questions appropriately.  Patient states "I am miserable".  He denies pain.  He begins asking me if he is dying.  Shared with patient my concerns for his current condition and no signs of meaningful recovery/improvement.  Patient keeps his eyes closed during discussion however states "I know" also stating he wishes to be at peace.  Inquiring if I had spoken with his son.  I did attempt to call patient son was at the bedside in addition to multiple attempts throughout today and on yesterday.  Patient continues to show no significant improvement and some what of a decline.  He is extremely weak and deconditioned and appetite remains poor.  Previous discussions with son who at that time requested watchful waiting and was hopeful for outpatient rehabilitation.  I am concerned patient would not be able to tolerate or participate actively in rehabilitation.  Prognosis remains poor with recommendations for care to focus on comfort/end-of-life care.  PMT will continue to reach out to family with hopes of continuing ongoing goals of care discussions which will focus on patient's quality of life and his poor prognosis.  Length of Stay: 8 days  Vital Signs: BP (!) 146/64 (BP Location: Left Arm)   Pulse (!) 58   Temp 98.4 F (36.9 C) (Oral)   Resp 18   Ht 5\' 11"  (1.803 m)   Wt 85.8 kg   SpO2 93%   BMI 26.38 kg/m  SpO2: SpO2: 93 % O2 Device: O2 Device: Room Air O2 Flow Rate: O2 Flow Rate (L/min): 2 L/min  Physical Exam: Somnolent, NAD,  chronically ill appearing RRR Rhonchi, congested cough Open eyes intermittently.  Soft-spoken.        Palliative Care Assessment & Plan    Code Status:  Limited code No CPR   Goals of Care/Recommendations:  Multiple attempts over the past 2 days to reach son and continue ongoing goals of care discussions.  Patient continues to show no signs of meaningful recovery and prognosis remains poor.  Would recommend focusing on patient's comfort and end-of-life care if family is in agreement.  Continue to treat the treatable until family has made final decisions.  Concerned as patient is asking if he is dying and states that he is tired.  Patient remains partial code.  Uncertain patient will thrive in the setting of rehabilitation.  Appetite remains poor.  PMT will continue to support and follow as needed. Please contact team line or by secure chat with urgent needs.   Prognosis: Poor  Discharge Planning: To Be Determined  Thank you for allowing the Palliative Medicine Team to assist in the care of this patient.  Time Total: 25 min.   Visit consisted of counseling and education dealing with the complex and emotionally intense issues of symptom management and palliative care in the setting of serious and potentially life-threatening illness.Greater than 50%  of this time was spent counseling and coordinating care related to the above assessment and plan.  Alda Lea, AGPCNP-BC  Palliative Medicine Team [phone]

## 2021-01-23 NOTE — NC FL2 (Signed)
[place] LEVEL OF CARE SCREENING TOOL     IDENTIFICATION  Patient Name: Lee Mercado Birthdate: 20-Oct-1924 Sex: male Admission Date (Current Location): 01/15/2021  [place] and [place] Number:  Engineering geologist and Address:  [facility], [address] [place], [place] [address]      Provider Number: [id number]  Attending Physician Name and Address:  Ezekiel Slocumb, DO  Relative Name and Phone Number:  Ellen, Mayol)   [phone] [facility])    Current Level of Care: Hospital Recommended Level of Care: [place] Prior Approval Number:    Date Approved/Denied:   PASRR Number: [id number]  Discharge Plan: SNF    Current Diagnoses: Patient Active Problem List   Diagnosis Date Noted  . GI bleeding 01/18/2021  . Dysphagia 01/18/2021  . Aspiration pneumonia ([place]) 01/18/2021  . Protein-calorie malnutrition, severe 01/17/2021  . Acute respiratory failure with hypoxia ([place]) 01/15/2021  . Lower extremity edema   . Acute respiratory failure ([place]) 01/01/2021  . Acute bronchitis 01/01/2021  . Hypothyroidism 11/21/2017  . Vertigo 11/20/2017  . Macrocytic anemia 11/20/2017  . Gait instability 11/20/2017  . Prediabetes 11/20/2017  . Do not resuscitate 11/12/2017  . TIA (transient ischemic attack) 11/11/2017  . B12 deficiency 09/04/2017  . CKD (chronic kidney disease) stage 3, GFR 30-59 ml/min (HCC) 09/04/2017  . Carpal tunnel syndrome 08/07/2017  . Essential thrombocytosis ([place]) 08/06/2017  . Hyperlipidemia 08/06/2017  . Atrial fibrillation ([place]) 08/06/2017    Orientation RESPIRATION BLADDER Height & Weight     Self  Normal Incontinent,External catheter Weight: 85.8 kg Height:  5\' 11"  (180.3 cm)  BEHAVIORAL SYMPTOMS/MOOD NEUROLOGICAL BOWEL NUTRITION STATUS      Incontinent Diet (dysphagia diet 1- nectar thick liquids)  AMBULATORY STATUS COMMUNICATION OF NEEDS Skin   Extensive Assist Verbally Skin abrasions                        Personal Care Assistance Level of Assistance  Total care Bathing Assistance: Maximum assistance Feeding assistance: Maximum assistance Dressing Assistance: Maximum assistance Total Care Assistance: Maximum assistance   Functional Limitations Info    Sight Info: Adequate Hearing Info: Adequate Speech Info: Adequate    SPECIAL CARE FACTORS FREQUENCY  PT (By licensed PT),OT (By licensed OT)     PT Frequency: 5 times per week OT Frequency: 5 times per week            Contractures Contractures Info: Not present    Additional Factors Info  Code Status,Allergies Code Status Info: Full code Allergies Info: NKA           Current Medications (01/23/2021):  This is the current hospital active medication list Current Facility-Administered Medications  Medication Dose Route Frequency Provider Last Rate Last Admin  . 0.9 %  sodium chloride infusion   Intravenous PRN Kayleen Memos, DO 10 mL/hr at 01/21/21 0818 Infusion Verify at 01/21/21 0818  . acetaminophen (TYLENOL) tablet 650 mg  650 mg Oral Q6H PRN Sharion Settler, NP   650 mg at 01/22/21 2147  . feeding supplement (NEPRO CARB STEADY) liquid 237 mL  237 mL Oral Q24H Nita Sells, MD 0 mL/hr at 01/16/21 1815 237 mL at 01/22/21 1703  . furosemide (LASIX) tablet 20 mg  20 mg Oral Daily Nicole Kindred A, DO   20 mg at 01/23/21 0904  . hydroxyurea (HYDREA) capsule 500 mg  500 mg Oral Daily Nita Sells, MD   500 mg at 01/23/21  6945  . levothyroxine (SYNTHROID) tablet 50 mcg  50 mcg Oral Daily Nita Sells, MD   50 mcg at 01/23/21 0551  . metoprolol tartrate (LOPRESSOR) tablet 12.5 mg  12.5 mg Oral BID Nita Sells, MD   12.5 mg at 01/23/21 0904  . multivitamin with minerals tablet 1 tablet  1 tablet Oral Daily Nita Sells, MD   1 tablet at 01/23/21 0388     Discharge Medications: Please see discharge summary for a list of discharge medications.  Relevant  Imaging Results:  Relevant Lab Results:   Additional Information SSN [id number]  Shelbie Hutching, RN

## 2021-01-23 NOTE — Progress Notes (Signed)
Nutrition Follow-up  DOCUMENTATION CODES:  Severe malnutrition in context of acute illness/injury  INTERVENTION:   Continue diet as ordered, advance as able per SLP recommendations  Continue Magic cup TID with meals, each supplement provides 290 kcal and 9 grams of protein  MVI with minerals daily  Continue Nepro Shake po 1x/d, each supplement provides 425 kcal and 19 grams protein  NUTRITION DIAGNOSIS:  Severe Malnutrition related to acute illness as evidenced by energy intake < or equal to 50% for > or equal to 5 days,percent weight loss,moderate muscle depletion (8.2% x 1 month). -In progress  GOAL:  Patient will meet greater than or equal to 90% of their needs - oral supplements in place to support intake  MONITOR:  PO intake,Supplement acceptance  REASON FOR ASSESSMENT:  Malnutrition Screening Tool    ASSESSMENT:  Pt admitted from his nursing facility with SOB and hypoxia. Confused on admission, no hx of dementia. Concern for aspiration pneumonia. Recently admitted 4/4-4/7 and followed by RD. PMH relevant for HLD, HTN, CVA, and atrial fibrillation.  Pt working with PT at the time of assessment, no family at bedside. PT and SLP note that pt is less alert and participating less than previous sessions. Palliative care continues to work with family of determining [place]. IVF were dc 4/25 to determine if pt could maintain hydration orally. Meal intake has been extremely poor this admission. Will leave supplements in place at this time at provide an additional source of nutrition.   Average Meal Intake: . 4/20-4/26: 9% average intake x 11 recorded meals  Scheduled Meds: . furosemide  20 mg Oral Daily  . hydroxyurea  500 mg Oral Daily  . levothyroxine  50 mcg Oral Daily  . multivitamin with minerals  1 tablet Oral Daily   Labs reviewed:  SBG ranges from 95-108 mg/dL   NUTRITION - FOCUSED PHYSICAL EXAM: Flowsheet Row Most Recent Value  Orbital Region Mild depletion  Upper  Arm Region No depletion  Thoracic and Lumbar Region Mild depletion  Buccal Region Mild depletion  Temple Region Moderate depletion  Clavicle Bone Region Mild depletion  Clavicle and Acromion Bone Region Moderate depletion  Scapular Bone Region Mild depletion  Dorsal Hand Moderate depletion  Patellar Region No depletion  Anterior Thigh Region No depletion  Posterior Calf Region No depletion  Edema (RD Assessment) Mild  [pitting to the BLE, worse to the left lower leg]  Hair Reviewed  Eyes Unable to assess  Mouth Reviewed  Skin Reviewed  Nails Reviewed     Diet Order:   Diet Order            DIET - DYS 1 Room service appropriate? Yes with Assist; Fluid consistency: Nectar Thick  Diet effective now                EDUCATION NEEDS:  No education needs have been identified at this time  Skin:  Skin Assessment: Reviewed RN Assessment (redness to coccyx, skin tear to the hand)  Last BM:  4/26 per RN documentation, type 7  Height:  Ht Readings from Last 1 Encounters:  01/15/21 5\' 11"  (1.803 m)    Weight:  Wt Readings from Last 1 Encounters:  01/22/21 85.8 kg    Ideal Body Weight:  78.2 kg  BMI:  Body mass index is 26.38 kg/m.  Estimated Nutritional Needs:   Kcal:  2000-2200 kcal  Protein:  110-110g  Fluid:  >2L/d   Ranell Patrick, RD, LDN Clinical Dietitian Pager on Amion

## 2021-01-23 NOTE — Progress Notes (Signed)
Speech Language Pathology Treatment: Dysphagia  Patient Details Name: Lee Mercado MRN: [id number] DOB: 22-Apr-1925 Today's Date: 01/23/2021 Time: 6948-5462 SLP Time Calculation (min) (ACUTE ONLY): 25 min  Assessment / Plan / Recommendation Clinical Impression  Pt seen for ongoing assessment of swallowing. He appears more fatigued overall today, less alert and engaged today vs at other sessions. He followed instructions w/ mod cues. Pt is on RA; wbc remains min elevated. Pt w/ less participation this session than previous sessions re: talking, energy overall. Pt explained general aspiration precautions and agreed verbally to the need for following them especially sitting upright for all oral intake. Pt assisted w/ positioning d/t weakness endorsing min back discomfort. Rest break and repositioning given. Noted phlegmy, wet vocal quality w/ cough to clear phlegm/secretions x2. Mod support given. Oral care completed w/ swabs. Then given trials of ice chips and nectar liquids via TSP given. No immediate, s/s of aspiration noted but delayed throat clearing noted w/ ice chip trials. Respiratory status remained calm and unlabored, vocal quality intermittently wet w/ phlegm it appeared as at baseline. Pt was fed; no straws were utiilized for better oral control. Oral phase appeared grossly [facility] for bolus management and timely A-P transfer for swallowing.  Recommend continue current Dysphagia diet w/ nectar liquids. Recommend aspiration precautions and not feeding pt unless he desires to - encourage. Pills Crushed in Puree; Feeding support at all meals. Must have oral care b/f meals and b/f Pleasure, Single ice chips. Recommend continued Palliative Care f/u for [place]. ST services will continue to f/u w/ pt for toleration of diet and education as needed while admitted. NSG updated. Precautions posted at bedside.    HPI HPI: Patient is a 87 y.o. male with medical history significant for essential thrombocytosis,  macrocytic anemia, hypothyroidism, CVA, paroxysmal A. fib on Eliquis, chronic dizziness and vertigo who presented to [facility] ED from SNF due to shortness of breath, productive cough, and hypoxemia, 86% on room air. Recent hospital discharge to SNF about 2 weeks ago after admit for acute bronchitis. Patient thought to have possible aspiration pneumonia. Chest x-ray was unremarkable although concern over possible aspiration due to failing a bedside swallowing study.  He was made n.p.o. due to this and placed on empiric antibiotics.  Head CT revealed no acute intracranial abnormality.  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  Chest x-ray revealed low lung volumes with vascular crowding but no obvious pulmonary edema.  OF NOTE, pt endorses "some" trouble swallowing and controlling own saliva for "~1 year" not s/p a "procedure" addressing "skin cancer on my neck". Pt stated to having had a "surgery" and "treatment" on his neck but was unclear if he had Radiation tx. This was related to the MD covering 01/16/2021.      SLP Plan  Continue with current plan of care       Recommendations  Diet recommendations: Dysphagia 1 (puree);Nectar-thick liquid (ice chips for pleasure when desired) Liquids provided via: Cup;No straw;Teaspoon Medication Administration: Crushed with puree Supervision: Staff to assist with self feeding;Full supervision/cueing for compensatory strategies Compensations: Minimize environmental distractions;Slow rate;Small sips/bites;Lingual sweep for clearance of pocketing;Multiple dry swallows after each bite/sip;Follow solids with liquid Postural Changes and/or Swallow Maneuvers: Seated upright 90 degrees;Upright 30-60 min after meal                General recommendations:  (Palliative care) Oral Care Recommendations: Oral care QID;Oral care before and after PO;Staff/trained caregiver to provide oral care Follow up Recommendations:   (  TBD) SLP Visit Diagnosis: Dysphagia, oropharyngeal phase (R13.12) Plan: Continue with current plan of care       GO                 [name], [place], CCC-SLP Speech Language Pathologist Rehab Services [phone] [facility] 01/23/2021, 11:51 AM

## 2021-01-23 NOTE — Progress Notes (Signed)
Physical Therapy Treatment Patient Details Name: Lee Mercado MRN: [id number] DOB: 28-Apr-1925 Today's Date: 01/23/2021    History of Present Illness Patient is a 87 y.o. male with medical history significant for essential thrombocytosis, macrocytic anemia, hypothyroidism, CVA, paroxysmal A. fib on Eliquis, chronic dizziness and vertigo who presented to [facility] ED from SNF due to shortness of breath, productive cough, and hypoxemia, 86% on room air. Recent hospital discharge to SNF about 2 weeks ago after admit for acute bronchitis. Patient thought to have possible aspiration pneumonia    PT Comments    Patient with minimal participation this session. Patient more lethargic today than previous session. Trial of sitting the bed in chair position to increase alertness and assess physiological response to mobility, however unable to raise head of bed past ~ 40 degrees due to patient complaining of back pain/discomfort. Patient required total assistance (despite cues for active participation with verbal and tactile cues)  for rolling to left and right in bed to change bed sheets that were wet with urine. Patient participated minimally with LE strengthening exercises in bed. No functional improvements noted during this session. PT will continue to follow and progress activity as patient is able to participate.    Follow Up Recommendations  SNF     Equipment Recommendations  None recommended by PT    Recommendations for Other Services       Precautions / Restrictions Precautions Precautions: Fall Restrictions Weight Bearing Restrictions: No    Mobility  Bed Mobility Overal bed mobility: Needs Assistance Bed Mobility: Rolling Rolling: Total assist         General bed mobility comments: Patient required total assistance for rolling to left and right side in bed for changing sheets as they were satuated with urine. patient does not actively participate with bed mobility efforts despite cues  and faciliation for increased participation. patient declined attempting to sit up on edge of bed due to weakness    Transfers                    Ambulation/Gait                 Stairs             Wheelchair Mobility    Modified Rankin (Stroke Patients Only)       Balance                                            Cognition Arousal/Alertness: Lethargic Behavior During Therapy: Flat affect Overall Cognitive Status: No family/caregiver present to determine baseline cognitive functioning                                 General Comments: Patient is more lethargic today copared to previous session.      Exercises General Exercises - Lower Extremity Ankle Circles/Pumps: AAROM;Strengthening;Both;10 reps;Supine Heel Slides: AAROM;Strengthening;Both;10 reps;Supine Hip ABduction/ADduction: AAROM;Strengthening;Both;10 reps;Supine Straight Leg Raises: AAROM;Strengthening;Both;10 reps;Supine Other Exercises Other Exercises: verbal and visual cues for exercise technique. patient with minimal participation with LE ROM exercises with AAROM to PROM provided.    General Comments General comments (skin integrity, edema, etc.): edema noted in BLE      Pertinent Vitals/Pain Pain Location: no pain is reported, however patient does grimace mildly with ROM of BLE (left leg more than  right leg) Pain Intervention(s): Monitored during session    Home Living                      Prior Function            PT Goals (current goals can now be found in the care plan section) Acute Rehab PT Goals Patient Stated Goal: none stated PT Goal Formulation: With patient Time For Goal Achievement: 01/30/21 Potential to Achieve Goals: Poor Progress towards PT goals: Not progressing toward goals - comment    Frequency           PT Plan Current plan remains appropriate    Co-evaluation              AM-PAC PT "6 Clicks"  Mobility   Outcome Measure  Help needed turning from your back to your side while in a flat bed without using bedrails?: Total Help needed moving from lying on your back to sitting on the side of a flat bed without using bedrails?: Total Help needed moving to and from a bed to a chair (including a wheelchair)?: Total Help needed standing up from a chair using your arms (e.g., wheelchair or bedside chair)?: Total Help needed to walk in hospital room?: Total Help needed climbing 3-5 steps with a railing? : Total 6 Click Score: 6    End of Session   Activity Tolerance: Patient limited by fatigue Patient left: in bed;with call bell/phone within reach;with bed alarm set (new sheets, draw pad, and gown applied) Nurse Communication: Mobility status PT Visit Diagnosis: Unsteadiness on feet (R26.81);Muscle weakness (generalized) (M62.81);Difficulty in walking, not elsewhere classified (R26.2)     Time: 2778-2423 PT Time Calculation (min) (ACUTE ONLY): 39 min  Charges:  $Therapeutic Exercise: 8-22 mins $Therapeutic Activity: 23-37 mins                     Minna Merritts, PT, MPT   Percell Locus 01/23/2021, 11:29 AM

## 2021-01-23 NOTE — TOC Progression Note (Signed)
Transition of Care [facility]) - Progression Note    Patient Details  Name: Maciah Schweigert MRN: [id number] Date of Birth: 1925-06-12  [facility] CM/SW Contact  Shelbie Hutching, RN Phone Number: 01/23/2021, 3:07 PM  Clinical Narrative:    RNCM attempted to reach patient's son to discuss discharge planning.  Bed search has been started- need to confirm with son if they would like for the patient to return to [facility].  Message left for Dominica Severin, son, for return call.    Expected Discharge Plan: [place] Barriers to Discharge: Continued Medical Work up  Expected Discharge Plan and Services Expected Discharge Plan: [place] In-house Referral: Hospice / Palliative Care Discharge Planning Services: CM Consult   Living arrangements for the past 2 months: [place]                 DME Arranged: N/A DME Agency: NA       HH Arranged: NA           Social Determinants of Health (SDOH) Interventions    Readmission Risk Interventions Readmission Risk Prevention Plan 01/18/2021  Transportation Screening Complete  PCP or Specialist Appt within 5-7 Days Complete  Home Care Screening Complete  Medication Review (RN CM) Complete  Some recent data might be hidden

## 2021-01-23 NOTE — Progress Notes (Signed)
PROGRESS NOTE     Lee Mercado   HTD:[id number]  DOB: [birth year]  PCP: Danae Orleans, MD    DOA: 01/15/2021 LOS: 50   Brief Narrative   87 yr old male [facility] resident, history of paroxysmal A. fib CHADS2 score >3 on Eliquis, HTN, HLD, essential thrombocytosis (hydroxyurea), macrocytic anemia, chronic dizziness and vertigo, history of a CVA. Recent admit 4/4 through 01/04/2021 - hypoxic respiratory failure 2/2 bronchitis-presumed prior smoker.  Returned to ED via EMS on 4/18 with SOB, dizziness, chronic cough, disoriented.  Found to have WBC 17 BNP 274 troponin 181. Failed swallow eval in ED, is aspirating.   CT head negative except for chronic microvascular ischemic changes, generalized atrophy and multiple old cerebellar infarcts. CXR showed low lung volumes with vascular crowding and streaky basilar atelectasis.  Admitted and started on IV antibiotics for presumed aspiration PNA.   Initially on Rocephin azithromycin >>>Unasyn.  Palliative care consulted for goals of care given patient's advanced age, failure to thrive, and now aspirating.    Patient developed GI bleeding with BRBPR and melena on 4/20.  Heparin drip was stopped.  Given tenuous respiratory status with aspiration, not currently candidate for endoscopic evaluations.  Hemoglobin trend stable and bleeding resolved with stopping anticoagulation.  4/23-25: Patient continues to have dysphagia and high risk for aspiration, although respiratory status has been stable.  Requiring IV hydration for hypernatremia and worsening renal function both secondary to inability to orally hydrate.  Fluids d/c'd 4/25 to assess trend of labs to assist with prognosis and decision making in [place].     Assessment & Plan   Principal Problem:   Aspiration pneumonia ([place]) Active Problems:   Acute respiratory failure with hypoxia (HCC)   Protein-calorie malnutrition, severe   GI bleeding   Dysphagia   Dysphagia / Aspirating  /inadequate p.o. intake Appreciate SLP's involvement and recommendations Diet: Dysphagia 1 with nectar thick liquids Aspiration precautions, assist with meals, supervise all oral intake Patient may have ice chips or spoons of water for pleasure   Generalized weakness / Physical debility - requiring max assist for all mobility including in bed.  Not likely a candidate for SNF,doubtful he can participate in therapy (ie not rehab-able). --[facility] discussions ongoing --hospice appropriate   GI bleeding - resolved with holding anticoagulation. RN staff report BRBPR and melena AM of 4/20.   Hbg is stable and improved.  Trend H&H  Transfuse pRBC's if Hbg < 7.0   GI consulted, pt would not be candidate for endoscopic evaluation at this time due to respiratory status  Tagged RBC scan cancelled as bleeding stopped   Hold anticoagulants   Acute kidney injury - resolved with hydration. Cr 1.30 on 4/23, due to poor oral intake. --STOP fluids and monitor   Lower extremity edema - suspect due to getting fluids and Lasix held.  He is otherwise dehydrated, sodium up.  This is likely third-spacing edema.    Monitor for now  Check BNP with AM labs  Resumed on home Lasix  Elevate legs, compression stockings  Hypernatremia - Resolved with d5w.  Due to inadequate PO intake due to aspiration.   Na 147>>146>>147>>144>>140 (off fluids)>> 139 >>138.  -- Stop fluids and monitor  -- Monitor BMP.    Hypokalemia - replaced in IV fluids.  Monitor BMP, replace as needed  Sepsis Ruled Out - only SIRS criteria met was leukocytosis.  Aspiration pneumonia/pneumonitis Speech therapy input appreciated DNR now Completed 7 days Unasyn  Monitor fever curve and respiratory status  Severe hypokalemia - resolved, recurred.  Replacing.  Monitor BMP and Mg levels  Replace further as needed  Demand ischemia Troponin trend flat-EKG no overt changes although does have fascicular blocks Appreciate cardiology  input-not a candidate for invasive eval Echocardiogram deferred No reported chest pain on my exam with him today  Atrial fibrillation CHADS2 score >4 on Eliquis PTA Off anticoagulation due to GI bleeding.  Chronic HFpEF - compensated, stable. EF 55-60% 11/11/2017 Resume home Lasix 20 given LE edema (4/25) Resume Toprol-XL 12.5 twice daily  Essential Thrombocytosis, Macrocytic Anemia Continue Hydrea folic acid etc. Outpatient eval with hematology as per them  Adult failure to thrive recurrent hospitalizations within 2 months Appreciate palliative care expertise-patient now DNR Discussions to continue, time for outcomes      Patient BMI: Body mass index is 26.38 kg/m.   DVT prophylaxis: SCDs Start: 01/15/21 1337   Diet:  Diet Orders (From admission, onward)    Start     Ordered   01/16/21 1131  DIET - DYS 1 Room service appropriate? Yes with Assist; Fluid consistency: Nectar Thick  Diet effective now       Comments: No Straws.  Extra Gravy on meats, potatoes. Yogurt, pudding, Magic Cup, Oatmeal w/ sugar/butters(per speech) at meals.  Question Answer Comment  Room service appropriate? Yes with Assist   Fluid consistency: Nectar Thick      01/16/21 1132            Code Status: Partial Code    Subjective 01/23/21    Patient more sleepy again today.  Says feet hurt.  Voice is still very quiet and hard to understand.  No acute events reported.   Disposition Plan & Communication   Status is: Inpatient  Remains inpatient appropriate because:Inpatient level of care appropriate due to severity of illness, ongoing evaluation, on IV antibiotics, aspirating, inadequate PO intake, ongoing goals of care discussion.   Dispo:  Patient From: [facility]  Planned Disposition: To be determined   Medically stable for discharge: No     Family Communication: updated son, Dominica Severin, by phone 4/21.  Palliative having ongoing discussions re goals of care.   Consults,  Procedures, Significant Events   Consultants:   Palliative Care  SLP  Procedures:   None  Antimicrobials:  Anti-infectives (From admission, onward)   Start     Dose/Rate Route Frequency Ordered Stop   01/16/21 0000  Ampicillin-Sulbactam (UNASYN) 3 g in sodium chloride 0.9 % 100 mL IVPB        3 g 200 mL/hr over 30 Minutes Intravenous Every 6 hours 01/15/21 2304 01/21/21 2049   01/15/21 1245  cefTRIAXone (ROCEPHIN) 1 g in sodium chloride 0.9 % 100 mL IVPB        1 g 200 mL/hr over 30 Minutes Intravenous  Once 01/15/21 1237 01/15/21 1410   01/15/21 1245  azithromycin (ZITHROMAX) 500 mg in sodium chloride 0.9 % 250 mL IVPB        500 mg 250 mL/hr over 60 Minutes Intravenous  Once 01/15/21 1237 01/15/21 1514        Micro    Objective   Vitals:   01/22/21 2335 01/23/21 0536 01/23/21 0711 01/23/21 1135  BP: (!) 118/54 (!) 150/68 (!) 150/56 (!) 146/64  Pulse: (!) 58 (!) 54 (!) 58 (!) 58  Resp: 18 18 16 18   Temp: 97.9 F (36.6 C) 97.7 F (36.5 C) 98 F (36.7 C) 98.4 F (36.9 C)  TempSrc: Oral   Oral  SpO2: 97% 97% 98% 93%  Weight:      Height:        Intake/Output Summary (Last 24 hours) at 01/23/2021 1550 Last data filed at 01/23/2021 1343 Gross per 24 hour  Intake 120 ml  Output 2 ml  Net 118 ml   Filed Weights   01/20/21 0500 01/21/21 0500 01/22/21 0500  Weight: 89.1 kg 88.1 kg 85.8 kg    Physical Exam:  General exam: sleeping sat up in bed but woke easily, no acute distress Respiratory system: CTAB, diminished bases due to shallow inspirations, no wheezes, normal respiratory effort, on room air. Cardiovascular system: RRR, 2-3+ lower extremity edema, worse in the feet L>R.   Gastrointestinal system: soft, non-tender, +bowel sounds   Labs   Data Reviewed: I have personally reviewed following labs and imaging studies  CBC: Recent Labs  Lab 01/17/21 0540 01/17/21 2349 01/18/21 0103 01/18/21 0654 01/18/21 1346 01/19/21 0427 01/20/21 0335  01/22/21 0413  WBC 15.0*  --  15.5*  --   --  12.1* 12.5* 11.1*  HGB 9.5*   < > 8.8* 9.3* 10.2* 9.6* 9.2* 9.6*  HCT 27.6*   < > 25.7* 27.0* 29.9* 27.7* 26.4* 27.5*  MCV 130.2*  --  133.9*  --   --  132.5* 131.3* 129.1*  PLT 396  --  401*  --   --  422* 414* 465*   < > = values in this interval not displayed.   Basic Metabolic Panel: Recent Labs  Lab 01/19/21 0427 01/20/21 0335 01/21/21 0412 01/22/21 0413 01/23/21 0434  NA 147* 144 140 139 138  K 3.2* 3.4* 3.5 3.5 3.9  CL 108 104 105 105 105  CO2 30 30 27 28 26   GLUCOSE 122* 125* 104* 108* 95  BUN 25* 23 21 18 16   CREATININE 1.22 1.30* 1.04 1.08 1.13  CALCIUM 8.4* 8.4* 8.1* 7.9* 8.2*  MG 1.9  --   --  1.9  --    GFR: Estimated Creatinine Clearance: 42.6 mL/min (by C-G formula based on SCr of 1.13 mg/dL). Liver Function Tests: Recent Labs  Lab 01/17/21 0540 01/18/21 0103 01/19/21 0427  AST 24 28 24   ALT 18 20 22   ALKPHOS 78 96 93  BILITOT 0.7 0.8 0.8  PROT 5.4* 5.4* 5.7*  ALBUMIN 2.3* 2.2* 2.3*   No results for input(s): LIPASE, AMYLASE in the last 168 hours. No results for input(s): AMMONIA in the last 168 hours. Coagulation Profile: No results for input(s): INR, PROTIME in the last 168 hours. Cardiac Enzymes: No results for input(s): CKTOTAL, CKMB, CKMBINDEX, TROPONINI in the last 168 hours. BNP (last 3 results) No results for input(s): PROBNP in the last 8760 hours. HbA1C: No results for input(s): HGBA1C in the last 72 hours. CBG: Recent Labs  Lab 01/19/21 1627  GLUCAP 124*   Lipid Profile: No results for input(s): CHOL, HDL, LDLCALC, TRIG, CHOLHDL, LDLDIRECT in the last 72 hours. Thyroid Function Tests: No results for input(s): TSH, T4TOTAL, FREET4, T3FREE, THYROIDAB in the last 72 hours. Anemia Panel: No results for input(s): VITAMINB12, FOLATE, FERRITIN, TIBC, IRON, RETICCTPCT in the last 72 hours. Sepsis Labs: Recent Labs  Lab 01/18/21 0103  PROCALCITON 0.19    Recent Results (from the past  240 hour(s))  Resp Panel by RT-PCR (Flu A&B, Covid) Nasopharyngeal Swab     Status: None   Collection Time: 01/15/21 12:12 PM   Specimen: Nasopharyngeal Swab; Nasopharyngeal(NP) swabs in vial transport medium  Result Value Ref Range Status  SARS Coronavirus 2 by RT PCR NEGATIVE NEGATIVE Final    Comment: (NOTE) SARS-CoV-2 target nucleic acids are NOT DETECTED.  The SARS-CoV-2 RNA is generally detectable in upper respiratory specimens during the acute phase of infection. The lowest concentration of SARS-CoV-2 viral copies this assay can detect is 138 copies/mL. A negative result does not preclude SARS-Cov-2 infection and should not be used as the sole basis for treatment or other patient management decisions. A negative result may occur with  improper specimen collection/handling, submission of specimen other than nasopharyngeal swab, presence of viral mutation(s) within the areas targeted by this assay, and inadequate number of viral copies(<138 copies/mL). A negative result must be combined with clinical observations, patient history, and epidemiological information. The expected result is Negative.  Fact Sheet for Patients:  EntrepreneurPulse.com.au  Fact Sheet for Healthcare Providers:  IncredibleEmployment.be  This test is no t yet approved or cleared by the [place] FDA and  has been authorized for detection and/or diagnosis of SARS-CoV-2 by FDA under an Emergency Use Authorization (EUA). This EUA will remain  in effect (meaning this test can be used) for the duration of the COVID-19 declaration under Section 564(b)(1) of the Act, 21 U.S.C.section 360bbb-3(b)(1), unless the authorization is terminated  or revoked sooner.       Influenza A by PCR NEGATIVE NEGATIVE Final   Influenza B by PCR NEGATIVE NEGATIVE Final    Comment: (NOTE) The Xpert Xpress SARS-CoV-2/FLU/RSV plus assay is intended as an aid in the diagnosis of influenza  from Nasopharyngeal swab specimens and should not be used as a sole basis for treatment. Nasal washings and aspirates are unacceptable for Xpert Xpress SARS-CoV-2/FLU/RSV testing.  Fact Sheet for Patients: EntrepreneurPulse.com.au  Fact Sheet for Healthcare Providers: IncredibleEmployment.be  This test is not yet approved or cleared by the [place] FDA and has been authorized for detection and/or diagnosis of SARS-CoV-2 by FDA under an Emergency Use Authorization (EUA). This EUA will remain in effect (meaning this test can be used) for the duration of the COVID-19 declaration under Section 564(b)(1) of the Act, 21 U.S.C. section 360bbb-3(b)(1), unless the authorization is terminated or revoked.  Performed at [facility], [address] [place], [place] [address]   Blood culture (routine x 2)     Status: None   Collection Time: 01/15/21 12:15 PM   Specimen: BLOOD  Result Value Ref Range Status   Specimen Description BLOOD BLOOD RIGHT HAND  Final   Special Requests   Final    BOTTLES DRAWN AEROBIC AND ANAEROBIC Blood Culture adequate volume   Culture   Final    NO GROWTH 5 DAYS Performed at [facility], [address], [place], [place] [address]    Report Status 01/20/2021 FINAL  Final  Blood culture (routine x 2)     Status: None   Collection Time: 01/15/21  7:02 PM   Specimen: BLOOD  Result Value Ref Range Status   Specimen Description BLOOD LEFT ANTECUBITAL  Final   Special Requests   Final    BOTTLES DRAWN AEROBIC AND ANAEROBIC Blood Culture results may not be optimal due to an inadequate volume of blood received in culture bottles   Culture   Final    NO GROWTH 5 DAYS Performed at [facility], [address] [place], [place] [address]    Report Status 01/20/2021 FINAL  Final  MRSA PCR Screening     Status: None   Collection Time: 01/21/21  3:57 PM  Specimen: Nasal Mucosa; Nasopharyngeal   Result Value Ref Range Status   MRSA by PCR NEGATIVE NEGATIVE Final    Comment:        The GeneXpert MRSA Assay (FDA approved for NASAL specimens only), is one component of a comprehensive MRSA colonization surveillance program. It is not intended to diagnose MRSA infection nor to guide or monitor treatment for MRSA infections. Performed at [facility], [address] [place], [place] [address]       Imaging Studies   No results found.   Medications   Scheduled Meds: . feeding supplement (NEPRO CARB STEADY)  237 mL Oral Q24H  . furosemide  20 mg Oral Daily  . hydroxyurea  500 mg Oral Daily  . levothyroxine  50 mcg Oral Daily  . metoprolol tartrate  12.5 mg Oral BID  . multivitamin with minerals  1 tablet Oral Daily   Continuous Infusions: . sodium chloride 10 mL/hr at 01/21/21 0818       LOS: 8 days    Time spent: 20 minutes    Ezekiel Slocumb, DO [facility]  01/23/2021, 3:50 PM      If 7PM-7AM, please contact night-coverage. How to contact the [facility] Attending or Consulting provider [place] or covering provider during after hours [place], for this patient?    1. Check the care team in [facility] and look for a) attending/consulting [facility] provider listed and b) the [facility] team listed 2. Log into [email or web address] and use [place]'s universal password to access. If you do not have the password, please contact the hospital operator. 3. Locate the [facility] provider you are looking for under [facility] and page to a number that you can be directly reached. 4. If you still have difficulty reaching the provider, please page the [facility] (Director on Call) for the Hospitalists listed on amion for assistance.

## 2021-01-23 NOTE — Progress Notes (Addendum)
     Referral received for Lee Mercado :goals of care discussion. Chart reviewed and updates received from RN. Patient somnolent but easily awakens. No family is at the bedside.    Attempted to contact patient's Lee Mercado ([phone]). Unable to reach. Voicemail left with contact information given.   Additional attempt to reach patient's son. No answer. ([phone]).   Will re-attempt to call son later with a goal of continued ongoing discussions.   Thank you for your referral and allowing PMT to assist in Mr. Lee Mercado care.   Alda Lea, AGPCNP-BC Palliative Medicine Team  Phone: [phone] Amion: N. [place]   NO CHARGE

## 2021-01-24 DIAGNOSIS — K922 Gastrointestinal hemorrhage, unspecified: Secondary | ICD-10-CM

## 2021-01-24 DIAGNOSIS — R600 Localized edema: Secondary | ICD-10-CM

## 2021-01-24 LAB — BASIC METABOLIC PANEL
Anion gap: 9 (ref 5–15)
BUN: 18 mg/dL (ref 8–23)
CO2: 25 mmol/L (ref 22–32)
Calcium: 8.2 mg/dL — ABNORMAL LOW (ref 8.9–10.3)
Chloride: 103 mmol/L (ref 98–111)
Creatinine, Ser: 1.02 mg/dL (ref 0.61–1.24)
GFR, Estimated: >60 mL/min (ref >60)
Glucose, Bld: 91 mg/dL (ref 70–99)
Potassium: 3.7 mmol/L (ref 3.5–5.1)
Sodium: 137 mmol/L (ref 135–145)

## 2021-01-24 MED ORDER — SORBITOL 70 % SOLN
30.0000 mL | Freq: Once | Status: AC
  Administered 2021-01-24: 15:00:00 30 mL via ORAL
  Filled 2021-01-24: qty 30

## 2021-01-24 NOTE — Progress Notes (Signed)
SLP Cancellation Note  Patient Details Name: Lee Mercado MRN: [id number] DOB: 1925-01-26   Cancelled treatment:       Reason Eval/Treat Not Completed:  (chart reviewed)  Will monitor pt's status via NSG report and chart notes; f/u next 1-2 days.  Recommend general aspiration precautions w/ dysphagia diet; frequent oral care.     [name], MS, CCC-SLP Speech Language Pathologist Rehab Services [phone] [facility] 01/24/2021, 8:32 AM

## 2021-01-24 NOTE — Progress Notes (Addendum)
PROGRESS NOTE    Lee Mercado  NID:[id number] DOB: 09-Mar-1925 DOA: 01/15/2021 PCP: Danae Orleans, MD    Chief Complaint  Patient presents with  . Shortness of Breath    Brief Narrative:  87 yr old male [facility] resident, history of paroxysmal A. fib CHADS2 score >3 on Eliquis, HTN, HLD, essential thrombocytosis (hydroxyurea), macrocytic anemia, chronic dizziness and vertigo, history of a CVA. Recent admit 4/4 through 01/04/2021 - hypoxic respiratory failure 2/2 bronchitis-presumed prior smoker.  Returned to ED via EMS on 4/18 with SOB, dizziness, chronic cough, disoriented.  Found to have WBC 17 BNP 274 troponin 181. Failed swallow eval in ED, is aspirating.   CT head negative except for chronic microvascular ischemic changes, generalized atrophy and multiple old cerebellar infarcts. CXR showed low lung volumes with vascular crowding and streaky basilar atelectasis.  Admitted and started on IV antibiotics for presumed aspiration PNA.   Initially on Rocephin azithromycin >>>Unasyn.  Palliative care consulted for goals of care given patient's advanced age, failure to thrive, and now aspirating.    Patient developed GI bleeding with BRBPR and melena on 4/20.  Heparin drip was stopped.  Given tenuous respiratory status with aspiration, not currently candidate for endoscopic evaluations.  Hemoglobin trend stable and bleeding resolved with stopping anticoagulation.  4/23-25: Patient continues to have dysphagia and high risk for aspiration, although respiratory status has been stable.  Requiring IV hydration for hypernatremia and worsening renal function both secondary to inability to orally hydrate.  Fluids d/c'd 4/25 to assess trend of labs to assist with prognosis and decision making in [place]   Assessment & Plan:   Principal Problem:   Aspiration pneumonia ([place]) Active Problems:   Acute respiratory failure with hypoxia ([place])   Protein-calorie malnutrition, severe   GI  bleeding   Dysphagia  #1 aspiration pneumonia/pneumonitis -Patient presenting with shortness of breath, cough, dizziness, noted to have a leukocytosis white count of 17,000. -Patient noted to be aspirating per speech therapy evaluation. -Status post 7 days IV antibiotics with Unasyn. -Patient with poor oral intake and likely continue aspirating. -Patient with a poor prognosis. -Palliative care following.  2.  Dysphagia/aspiration/inadequate oral intake/failure to thrive -Currently on a dysphagia 1 diet with nectar thick liquids. -Patient likely with ongoing aspiration. -Patient with poor oral intake and declining. -Continue aspiration precautions, assistance with meals, supervise all oral intake. -Palliative care following.  3.  Generalized weakness/physical debility/FTT -Patient noted to be requiring maximum assistance for all mobility including in the bed. -Patient with minimal to no oral intake. -Patient declining. -Likely not a candidate for skilled nursing facility as the outpatient is going to be able to participate in therapy. -Palliative care following and goals of care discussions ongoing at this time. -Patient hospice appropriate.    4. GI bleed--resolved -Patient noted to have had bright red blood per rectum and melena the morning of 01/17/2021. -Patient noted to be on anticoagulation which was subsequently held. -GI consulted and felt patient would not be a candidate for any endoscopic evaluation at this time due to his respiratory status. -tagged RBC scan was ordered but canceled as bleeding seems to have resolved. -Hemoglobin stabilized and currently at 9.6 (4/25). -Follow H&H, transfusion threshold hemoglobin < 7. -Continue to hold anticoagulation.  5.  Acute kidney injury -Resolved with hydration.  6.  Lower extremity edema -Felt secondary to IV fluids with diuretics being held. -Consented patient likely third spacing. -Home regimen Lasix resumed. -Compression  stockings.  7.  Hypernatremia -Likely secondary to  dehydration secondary to poor oral intake and aspiration pneumonia. -Improved with D5W. -IV fluids have been held. -Follow.  8.  Hypokalemia -Repleted.  9.  Sepsis Is ruled out  10.  Demand ischemia -Troponin is noted to be elevated but flattened -EKG done with no ischemic changes noted. -She is seen in consultation by cardiology and was felt patient was not a candidate for invasive evaluation and as such 2D echo deferred. -Asymptomatic. -Outpatient follow-up.  11.  Atrial fibrillation CHADS2 score > 4 -Was on anticoagulation with Eliquis prior to admission. -Anticoagulation discontinued secondary to GI bleed. -Outpatient follow-up to determine whether anticoagulation needs to be resumed post discharge.  12.  Chronic HFpEF -Stable. -2D echo with EF of 55 to 60%(11/11/2017). -Lasix resumed. -Toprol resumed. -Outpatient follow-up.  13.  Essential thrombocytosis/microcytic anemia -Continue hydroxyurea, folic acid. -Outpatient follow-up with hematology.  14.  Adult failure to thrive/recurrent hospitalizations -Patient with recurrent hospitalizations within the past 2 months. -Patient admitted with aspiration with pneumonia with ongoing aspiration. -Patient with minimal to no oral intake. -Patient declining. -Palliative care following.  15.  Severe protein calorie malnutrition -Secondary to poor oral intake and aspiration. -Patient declining. -Palliative care following. -Likely hospice eligible at this time. -      DVT prophylaxis: SCDs Code Status: Partial code- Family Communication: No family at bedside. Disposition:   Status is: Inpatient    Dispo:  Patient From: [place]  Planned Disposition: To be determined  Medically stable for discharge: No         Consultants:   Cardiology: Dr. Ubaldo Glassing 01/16/2021  Palliative care: Royden Purl, NP/Dr. Hilma Favors 01/16/2021  Procedures:   CT  head 01/16/2021  Chest x-ray 01/15/2021, 01/21/2021  Antimicrobials:   IV Unasyn 01/16/2021>>>>> 01/21/2021  IV azithromycin 4/18 /2022x1 dose  IV Rocephin 01/15/2021 x 1 dose   Subjective: Patient laying in bed.  States does not feel too good today.  Complaining of headache.  No chest pain.  No shortness of breath.  States just does not have an appetite.  States this does not feel like eating.  Minimal oral intake.  Objective: Vitals:   01/23/21 2016 01/24/21 0010 01/24/21 0431 01/24/21 0900  BP: 128/71 138/77 126/63   Pulse: 66 69 68 62  Resp: 18 18 18 17   Temp: 97.6 F (36.4 C) 98.2 F (36.8 C) 98 F (36.7 C) 98.2 F (36.8 C)  TempSrc:    Oral  SpO2: 92% 92% 93% 95%  Weight:      Height:        Intake/Output Summary (Last 24 hours) at 01/24/2021 1053 Last data filed at 01/24/2021 1007 Gross per 24 hour  Intake 0 ml  Output 250 ml  Net -250 ml   Filed Weights   01/20/21 0500 01/21/21 0500 01/22/21 0500  Weight: 89.1 kg 88.1 kg 85.8 kg    Examination:  General exam: Dry mucous membranes Respiratory system: Clear to auscultation anterior lung fields.  No wheezes, no crackles, no rhonchi. Respiratory effort normal. Cardiovascular system: Regular rate and rhythm no murmurs rubs or gallops.  No JVD.  No lower extremity edema.  Gastrointestinal system: Abdomen is nondistended, soft and nontender. No organomegaly or masses felt. Normal bowel sounds heard. Central nervous system: Alert. No focal neurological deficits. Extremities: Symmetric 5 x 5 power. Skin: No rashes, lesions or ulcers Psychiatry: Judgement and insight appear fair. Mood & affect appropriate.     Data Reviewed: I have personally reviewed following labs and imaging studies  CBC: Recent  Labs  Lab 01/18/21 0103 01/18/21 0654 01/18/21 1346 01/19/21 0427 01/20/21 0335 01/22/21 0413  WBC 15.5*  --   --  12.1* 12.5* 11.1*  HGB 8.8* 9.3* 10.2* 9.6* 9.2* 9.6*  HCT 25.7* 27.0* 29.9* 27.7* 26.4* 27.5*   MCV 133.9*  --   --  132.5* 131.3* 129.1*  PLT 401*  --   --  422* 414* 465*    Basic Metabolic Panel: Recent Labs  Lab 01/19/21 0427 01/20/21 0335 01/21/21 0412 01/22/21 0413 01/23/21 0434 01/24/21 0322  NA 147* 144 140 139 138 137  K 3.2* 3.4* 3.5 3.5 3.9 3.7  CL 108 104 105 105 105 103  CO2 30 30 27 28 26 25   GLUCOSE 122* 125* 104* 108* 95 91  BUN 25* 23 21 18 16 18   CREATININE 1.22 1.30* 1.04 1.08 1.13 1.02  CALCIUM 8.4* 8.4* 8.1* 7.9* 8.2* 8.2*  MG 1.9  --   --  1.9  --   --     GFR: Estimated Creatinine Clearance: 47.2 mL/min (by C-G formula based on SCr of 1.02 mg/dL).  Liver Function Tests: Recent Labs  Lab 01/18/21 0103 01/19/21 0427  AST 28 24  ALT 20 22  ALKPHOS 96 93  BILITOT 0.8 0.8  PROT 5.4* 5.7*  ALBUMIN 2.2* 2.3*    CBG: Recent Labs  Lab 01/19/21 1627  GLUCAP 124*     Recent Results (from the past 240 hour(s))  Resp Panel by RT-PCR (Flu A&B, Covid) Nasopharyngeal Swab     Status: None   Collection Time: 01/15/21 12:12 PM   Specimen: Nasopharyngeal Swab; Nasopharyngeal(NP) swabs in vial transport medium  Result Value Ref Range Status   SARS Coronavirus 2 by RT PCR NEGATIVE NEGATIVE Final    Comment: (NOTE) SARS-CoV-2 target nucleic acids are NOT DETECTED.  The SARS-CoV-2 RNA is generally detectable in upper respiratory specimens during the acute phase of infection. The lowest concentration of SARS-CoV-2 viral copies this assay can detect is 138 copies/mL. A negative result does not preclude SARS-Cov-2 infection and should not be used as the sole basis for treatment or other patient management decisions. A negative result may occur with  improper specimen collection/handling, submission of specimen other than nasopharyngeal swab, presence of viral mutation(s) within the areas targeted by this assay, and inadequate number of viral copies(<138 copies/mL). A negative result must be combined with clinical observations, patient history,  and epidemiological information. The expected result is Negative.  Fact Sheet for Patients:  EntrepreneurPulse.com.au  Fact Sheet for Healthcare Providers:  IncredibleEmployment.be  This test is no t yet approved or cleared by the [place] FDA and  has been authorized for detection and/or diagnosis of SARS-CoV-2 by FDA under an Emergency Use Authorization (EUA). This EUA will remain  in effect (meaning this test can be used) for the duration of the COVID-19 declaration under Section 564(b)(1) of the Act, 21 U.S.C.section 360bbb-3(b)(1), unless the authorization is terminated  or revoked sooner.       Influenza A by PCR NEGATIVE NEGATIVE Final   Influenza B by PCR NEGATIVE NEGATIVE Final    Comment: (NOTE) The Xpert Xpress SARS-CoV-2/FLU/RSV plus assay is intended as an aid in the diagnosis of influenza from Nasopharyngeal swab specimens and should not be used as a sole basis for treatment. Nasal washings and aspirates are unacceptable for Xpert Xpress SARS-CoV-2/FLU/RSV testing.  Fact Sheet for Patients: EntrepreneurPulse.com.au  Fact Sheet for Healthcare Providers: IncredibleEmployment.be  This test is not yet approved or cleared by  the [employer] and has been authorized for detection and/or diagnosis of SARS-CoV-2 by FDA under an Emergency Use Authorization (EUA). This EUA will remain in effect (meaning this test can be used) for the duration of the COVID-19 declaration under Section 564(b)(1) of the Act, 21 U.S.C. section 360bbb-3(b)(1), unless the authorization is terminated or revoked.  Performed at [facility], [address] [place], [place] [address]   Blood culture (routine x 2)     Status: None   Collection Time: 01/15/21 12:15 PM   Specimen: BLOOD  Result Value Ref Range Status   Specimen Description BLOOD BLOOD RIGHT HAND  Final   Special Requests   Final     BOTTLES DRAWN AEROBIC AND ANAEROBIC Blood Culture adequate volume   Culture   Final    NO GROWTH 5 DAYS Performed at [facility], [address], [place], [place] [address]    Report Status 01/20/2021 FINAL  Final  Blood culture (routine x 2)     Status: None   Collection Time: 01/15/21  7:02 PM   Specimen: BLOOD  Result Value Ref Range Status   Specimen Description BLOOD LEFT ANTECUBITAL  Final   Special Requests   Final    BOTTLES DRAWN AEROBIC AND ANAEROBIC Blood Culture results may not be optimal due to an inadequate volume of blood received in culture bottles   Culture   Final    NO GROWTH 5 DAYS Performed at [facility], [address] [place], [place] [address]    Report Status 01/20/2021 FINAL  Final  MRSA PCR Screening     Status: None   Collection Time: 01/21/21  3:57 PM   Specimen: Nasal Mucosa; Nasopharyngeal  Result Value Ref Range Status   MRSA by PCR NEGATIVE NEGATIVE Final    Comment:        The GeneXpert MRSA Assay (FDA approved for NASAL specimens only), is one component of a comprehensive MRSA colonization surveillance program. It is not intended to diagnose MRSA infection nor to guide or monitor treatment for MRSA infections. Performed at [facility], [address], [place], [place] [address]          Radiology Studies: No results found.      Scheduled Meds: . feeding supplement (NEPRO CARB STEADY)  237 mL Oral Q24H  . furosemide  20 mg Oral Daily  . hydroxyurea  500 mg Oral Daily  . levothyroxine  50 mcg Oral Daily  . metoprolol tartrate  12.5 mg Oral BID  . multivitamin with minerals  1 tablet Oral Daily   Continuous Infusions: . sodium chloride 10 mL/hr at 01/21/21 0818     LOS: 9 days    Time spent: 35 minutes    Irine Seal, MD [facility]   To contact the attending provider between 7A-7P or the covering provider during after hours 7P-7A, please log into the web site  [email or web address] and access using universal [place] password for that web site. If you do not have the password, please call the hospital operator.  01/24/2021, 10:53 AM

## 2021-01-24 NOTE — TOC Progression Note (Signed)
Transition of Care [facility]) - Progression Note    Patient Details  Name: Lee Mercado MRN: [id number] Date of Birth: March 03, 1925  [facility]) CM/SW Contact  Shelbie Hutching, RN Phone Number: 01/24/2021, 3:23 PM  Clinical Narrative:    RNCM received call from patient's son this morning.  RNCM discussed discharge planning and asked the patient what they wanted to do for discharge.  Son says that he would like to get both is mother and father to [facility] for long term care.  Patient came in from [facility] after being there for short term rehab.  [facility] has offered a bed and so has [facility], have not received a bed offer from [facility] as of yet.  RNCM will start insurance auth just leaving facility pending.    Expected Discharge Plan: [place] Barriers to Discharge: Continued Medical Work up  Expected Discharge Plan and Services Expected Discharge Plan: [place] In-house Referral: Hospice / Palliative Care Discharge Planning Services: CM Consult   Living arrangements for the past 2 months: [place]                 DME Arranged: N/A DME Agency: NA       HH Arranged: NA           Social Determinants of Health (SDOH) Interventions    Readmission Risk Interventions Readmission Risk Prevention Plan 01/18/2021  Transportation Screening Complete  PCP or Specialist Appt within 5-7 Days Complete  Home Care Screening Complete  Medication Review (RN CM) Complete  Some recent data might be hidden

## 2021-01-24 NOTE — Progress Notes (Signed)
   Patient assessed and is unable to engage appropriately in discussions. Attempted to contact patient's son, Dominica Severin. Unable to reach. Voicemail left with contact information given.   This provider has attempted to reach out to son, since Monday, several times each day with several voicemails left. Will continue to reach out to son with hopes of continuing ongoing goals of care discussions.   Patient would most benefit for comfort, dnr, and hospice support. He continues to show no improvement, poor oral nutrition, aspiration.    Alda Lea, AGPCNP-BC Palliative Medicine Team  Phone: [phone] Pager: [phone] Amion: N. [place]   NO CHARGE

## 2021-01-24 NOTE — Progress Notes (Signed)
Daily Progress Note   Patient Name: Lee Mercado       Date: 01/24/2021 DOB: November 14, 1924  Age: 87 y.o. MRN#: [id number] Attending Physician: Eugenie Filler, MD Primary Care Physician: Danae Orleans, MD Admit Date: 01/15/2021  Reason for Consultation/Follow-up: Establishing goals of care  Subjective:  Reviewed.  Updates received.  Patient assessed.  Patient remains somnolent but will easily arouse.  Denies pain but does state he does not feel well.  Continues to have poor appetite with little to no sips and bites.  Patient states he has no desire to eat.  Seems to ask about his son.  Was able to speak with some and provide extensive updates.  We discussed at length patient's current illness with no signs of meaningful recovery.  I discussed at length patient's poor nutrition, aspiration risk, and severe weakness and deconditioning.   I was open and honest with son during conversation emphasizing patient's wishes to be at peace.  I encouraged him to consider patient's quality of life and current condition and see consider final decisions.  Recommendations for transitioning care to focus on his comfort and hospice support was provided.  Detailed education provided on hospice and what comfort care would look like while hospitalized.  Lee Mercado verbalized his understanding however shares his frustration with different scenarios for plan of care.  He shares he recently had a discussion regarding patient returning back to [facility].  We discussed although this was the original plan over the past several days patient has not shown improvement and medically he most likely will not gain any meaningful recovery or improvement from rehab.  Given patient's current state of health he is not going to be able to tolerate much rehab.  I also encouraged son to look at patient's overall health given his poor nutrition.  Verbalized understanding and is requesting 24 hours to make further decisions and  discuss with other family.  Lee Mercado is clear he does not want his father to suffer however he wants to be sure he is making the right decision.  He states he has not been able to visit and has been limited by phone times that due to the severity of his work.  Discussion regarding patient's partial CODE STATUS with recommendations for DNR.  Lee Mercado verbalized understanding expressing he would also like some time to make final decisions.  All questions answered and support provided.  Length of Stay: 9 days  Vital Signs: BP 126/63 (BP Location: Left Arm)   Pulse 62   Temp 98.2 F (36.8 C) (Oral)   Resp 17   Ht 5\' 11"  (1.803 m)   Wt 85.8 kg   SpO2 95%   BMI 26.38 kg/m  SpO2: SpO2: 95 % O2 Device: O2 Device: Room Air O2 Flow Rate: O2 Flow Rate (L/min): 2 L/min  Physical Exam: NAD, cachectic, chronically ill-appearing RRR Diminished bilaterally Somnolent, will open eyes and answer questions follow simple commands             Palliative Care Assessment & Plan   Goals of Care/Recommendations:  Spoke to son Lee Mercado and provided extensive updates including recommendations for care to transition to focus on his comfort and hospice home referral versus home with hospice.  Son verbalized understanding and was requesting 24 hours to make final decisions in the ability to further discuss with family.  Patient most likely would not gain any meaningful recovery or benefit from rehab.  PMT will continue to support and follow.  Prognosis: poor  Discharge Planning: To Be Determined  Thank you for allowing the Palliative Medicine Team to assist in the care of this patient.  Time Total: 45 min.   Visit consisted of counseling and education dealing with the complex and emotionally intense issues of symptom management and palliative care in the setting of serious and potentially life-threatening illness.Greater than 50%  of this time was spent counseling and coordinating care related to the above  assessment and plan.  Alda Lea, AGPCNP-BC  Palliative Medicine Team [phone]

## 2021-01-25 ENCOUNTER — Inpatient Hospital Stay: Payer: Medicare Other

## 2021-01-25 LAB — BASIC METABOLIC PANEL
Anion gap: 11 (ref 5–15)
BUN: 24 mg/dL — ABNORMAL HIGH (ref 8–23)
CO2: 25 mmol/L (ref 22–32)
Calcium: 8.2 mg/dL — ABNORMAL LOW (ref 8.9–10.3)
Chloride: 104 mmol/L (ref 98–111)
Creatinine, Ser: 1.51 mg/dL — ABNORMAL HIGH (ref 0.61–1.24)
GFR, Estimated: 43 mL/min — ABNORMAL LOW (ref >60)
Glucose, Bld: 125 mg/dL — ABNORMAL HIGH (ref 70–99)
Potassium: 4 mmol/L (ref 3.5–5.1)
Sodium: 140 mmol/L (ref 135–145)

## 2021-01-25 LAB — CBC
HCT: 29.1 % — ABNORMAL LOW (ref 39.0–52.0)
Hemoglobin: 10.3 g/dL — ABNORMAL LOW (ref 13.0–17.0)
MCH: 44.6 pg — ABNORMAL HIGH (ref 26.0–34.0)
MCHC: 35.4 g/dL (ref 30.0–36.0)
MCV: 126 fL — ABNORMAL HIGH (ref 80.0–100.0)
Platelets: 365 10*3/uL (ref 150–400)
RBC: 2.31 MIL/uL — ABNORMAL LOW (ref 4.22–5.81)
RDW: 13.5 % (ref 11.5–15.5)
WBC: 18.2 10*3/uL — ABNORMAL HIGH (ref 4.0–10.5)
nRBC: 0.3 % — ABNORMAL HIGH (ref 0.0–0.2)

## 2021-01-25 MED ORDER — SCOPOLAMINE 1 MG/3DAYS TD PT72
1.0000 | MEDICATED_PATCH | TRANSDERMAL | Status: DC
  Administered 2021-01-25: 1.5 mg via TRANSDERMAL
  Filled 2021-01-25: qty 1

## 2021-01-25 MED ORDER — DEXTROSE-NACL 5-0.45 % IV SOLN: INTRAVENOUS | Status: DC

## 2021-01-25 MED ORDER — BIOTENE DRY MOUTH MT LIQD: 15.0000 mL | OROMUCOSAL | Status: DC | PRN

## 2021-01-25 MED ORDER — GLYCOPYRROLATE 0.2 MG/ML IJ SOLN
0.1000 mg | Freq: Two times a day (BID) | INTRAMUSCULAR | Status: DC | PRN
  Administered 2021-01-25: 0.1 mg via INTRAVENOUS
  Filled 2021-01-25: qty 1

## 2021-01-25 MED ORDER — FUROSEMIDE 10 MG/ML IJ SOLN
20.0000 mg | Freq: Once | INTRAMUSCULAR | Status: AC
  Administered 2021-01-25: 20 mg via INTRAVENOUS
  Filled 2021-01-25: qty 2

## 2021-01-25 MED ORDER — ONDANSETRON HCL 4 MG/2ML IJ SOLN: 4.0000 mg | Freq: Four times a day (QID) | INTRAMUSCULAR | Status: DC | PRN

## 2021-01-25 MED ORDER — HALOPERIDOL LACTATE 5 MG/ML IJ SOLN: 0.5000 mg | INTRAMUSCULAR | Status: DC | PRN

## 2021-01-25 MED ORDER — DIPHENHYDRAMINE HCL 50 MG/ML IJ SOLN: 12.5000 mg | INTRAMUSCULAR | Status: DC | PRN

## 2021-01-25 MED ORDER — PIPERACILLIN-TAZOBACTAM 3.375 G IVPB
3.3750 g | Freq: Three times a day (TID) | INTRAVENOUS | Status: DC
  Administered 2021-01-25 – 2021-01-26 (×4): 3.375 g via INTRAVENOUS
  Filled 2021-01-25 (×4): qty 50

## 2021-01-25 MED ORDER — POLYVINYL ALCOHOL 1.4 % OP SOLN
1.0000 [drp] | Freq: Four times a day (QID) | OPHTHALMIC | Status: DC | PRN
  Filled 2021-01-25: qty 15

## 2021-01-25 MED ORDER — PIPERACILLIN-TAZOBACTAM 3.375 G IVPB: 3.3750 g | Freq: Three times a day (TID) | INTRAVENOUS | Status: DC

## 2021-01-25 MED ORDER — ACETAMINOPHEN 650 MG RE SUPP: 650.0000 mg | Freq: Four times a day (QID) | RECTAL | Status: DC | PRN

## 2021-01-25 MED ORDER — MORPHINE SULFATE (PF) 2 MG/ML IV SOLN
2.0000 mg | INTRAVENOUS | Status: DC | PRN
  Administered 2021-01-25 – 2021-01-27 (×5): 2 mg via INTRAVENOUS
  Filled 2021-01-25 (×5): qty 1

## 2021-01-25 MED ORDER — LORAZEPAM 2 MG/ML IJ SOLN: 0.5000 mg | Freq: Four times a day (QID) | INTRAMUSCULAR | Status: DC | PRN

## 2021-01-25 MED ORDER — GLYCOPYRROLATE 0.2 MG/ML IJ SOLN
0.4000 mg | INTRAMUSCULAR | Status: DC
  Administered 2021-01-25 – 2021-01-26 (×6): 0.4 mg via INTRAVENOUS
  Filled 2021-01-25 (×6): qty 2

## 2021-01-25 NOTE — Progress Notes (Signed)
Daily Progress Note   Patient Name: Lee Mercado       Date: 01/25/2021 DOB: [birth year]  Age: 87 y.o. MRN#: [id number] Attending Physician: Eugenie Filler, MD Primary Care Physician: Danae Orleans, MD Admit Date: 01/15/2021  Reason for Consultation/Follow-up: Establishing goals of care  Subjective:  Reviewed.  Updates received.  Patient assessed.  Patient continues to decline. Increased work of breathing, audible secretions/gurgling, minimally responsive. Recent chest x-ray shows aspiration pneumonia.   Family is at the bedside (son, grandson, wife, and daughter-in-law). Updates provided. We discussed at length patient's decline and poor prognosis. Family tearful.   I created space and opportunity to discuss further patient's partial code status with consideration to his current condition and co-morbidities. Family is requesting DNR/DNI. Education provided on what DNR would look like for Mr. Gerling and the care that would be provided. Family verbalized understanding and appreciation.   Education and recommendations provided on comfort care. Family verbalized understanding and expressed wishes to focus solely on patient's comfort and end-of-life care. Family is aware patient would no longer receive medical interventions such as continuous vital signs, lab work, radiology testing, or medications not focused on comfort. All care would focus on how the patient is looking and feeling. This would include management of any symptoms that may cause discomfort, pain, shortness of breath, cough, nausea, agitation, anxiety, and/or secretions etc. Symptoms would be managed with medications and other non-pharmacological interventions such as spiritual support if requested, repositioning, music therapy, or therapeutic listening. Family verbalized understanding and appreciation.   We discussed outpatient hospice. Family has requested referral for [facility] location. Education provided on no bed  availability and with understanding Mr. Cardell will remain hospitalized receiving care to focus on his comfort until a bed at hospice becomes available. We discussed possibility of hospital death depending on his condition, stability to transfer, and bed availability. They verbalized understanding.   All questions answered and support provided.  Length of Stay: 10 days  Vital Signs: BP (!) 160/123 (BP Location: Left Arm)   Pulse 77   Temp 98.2 F (36.8 C)   Resp 18   Ht 5' 11"  (1.803 m)   Wt 88.1 kg   SpO2 94%   BMI 27.09 kg/m  SpO2: SpO2: 94 % O2 Device: O2 Device: Nasal Cannula O2 Flow Rate: O2 Flow Rate (L/min): 2 L/min  Physical Exam: Increased work of breathing, cachectic, chronically ill-appearing RRR Bilateral rhonchi, audible congestion/wheezing  lethargic          Palliative Care Assessment & Plan   Goals of Care/Recommendations:  DNR/DNI-as requested and confirmed by family  After lengthy discussion and education family has requested to transition all care to focus on comfort/EOL. Request for residential hospice placement (TOC aware and have met with family) Morphine PRN for pain/air hunger/comfort Robinul PRN for excessive secretions Ativan PRN for agitation/anxiety Zofran PRN for nausea Liquifilm tears PRN for dry eyes Haldol PRN for agitation/anxiety May have comfort feeding Comfort cart for family Scopolamine patch for secretions IV lasix for fluid Will continue IV antibiotics for symptom management in the setting of aspiration pneumonia  Unrestricted visitations in the setting of EOL (per policy) Oxygen PRN 2L or less for comfort. No escalation.   PMT will continue to support and follow.  Prognosis: Days-Weeks  Discharge Planning: Hospice facility  Thank you for allowing the Palliative Medicine Team to assist in the care of this patient.  Time Total: 50 min.   Visit consisted of counseling and education dealing with  the complex and emotionally  intense issues of symptom management and palliative care in the setting of serious and potentially life-threatening illness.Greater than 50%  of this time was spent counseling and coordinating care related to the above assessment and plan.  Alda Lea, AGPCNP-BC  Palliative Medicine Team [phone]

## 2021-01-25 NOTE — Progress Notes (Signed)
SLP Cancellation Note  Patient Details Name: Lee Mercado MRN: [id number] DOB: [birth year]   Cancelled treatment:       Reason Eval/Treat Not Completed:  (chart reviewed; consulted Palliative Care NSG). Per discussion w/ Palliative Care, pt has transitioned to Comfort Care at this time. ST services will sign off at this time. NSG to reconsult if new needs arise.     [name], MS, CCC-SLP Speech Language Pathologist Rehab Services [phone] [facility] 01/25/2021, 12:47 PM

## 2021-01-25 NOTE — TOC Progression Note (Signed)
Transition of Care [facility]) - Progression Note    Patient Details  Name: Lee Mercado MRN: [id number] Date of Birth: [birth year]  Transition of Care [facility]) CM/SW Contact  Shelbie Hutching, RN Phone Number: 01/25/2021, 11:06 AM  Clinical Narrative:    RNCM met with patient and family at the bedside this morning.  Family has decided that they would like for the patient to be comfortable and go to the [facility] facility in [place].  Hospice referral given to [facility] with [facility].     Expected Discharge Plan: [place] Barriers to Discharge: Hospice Bed not available  Expected Discharge Plan and Services Expected Discharge Plan: [place] In-house Referral: Hospice / Palliative Care Discharge Planning Services: CM Consult   Living arrangements for the past 2 months: [place]                 DME Arranged: N/A DME Agency: NA       HH Arranged: NA           Social Determinants of Health (SDOH) Interventions    Readmission Risk Interventions Readmission Risk Prevention Plan 01/18/2021  Transportation Screening Complete  PCP or Specialist Appt within 5-7 Days Complete  Home Care Screening Complete  Medication Review (RN CM) Complete  Some recent data might be hidden

## 2021-01-25 NOTE — Progress Notes (Signed)
   01/25/21 1020  Clinical Encounter Type  Visited With Patient and family together  Visit Type Initial;Spiritual support;Social support  Referral From Physician  Consult/Referral To Chaplain   Chaplain was asked in hallway to visit PT and his family by physician. According to the doctor PT is declining in health, and will be transferred to hospice soon. The wife of 40 years was at bedside, in addition to the son, and daughter in law. Daughter in law, stated its hard to see him like this, and want him to have some sense of peace in his body.Chaplain ministered with emotional support, spiritual support, and prayer.

## 2021-01-25 NOTE — Care Management Important Message (Signed)
Important Message  Patient Details  Name: Lee Mercado MRN: [id number] Date of Birth: 1925/03/21   Medicare Important Message Given:  Other (see comment)  Patient is on comfort care and awaiting a bed at the [place]. Out of respect for the patient and family no Important Message from [facility] given.    [name] A Dessie Delcarlo 01/25/2021, 2:02 PM

## 2021-01-25 NOTE — Progress Notes (Addendum)
[place] [facility]) Hospital Liaison RN note:  Received request from Dr. Grandville Silos and Doran Clay, [facility] for family interest in [place]. Chart reviewed and eligibility was approved. Spoke with son, Dominica Severin and family in room to confirm interest and explain services. He verbalized understanding and all questions were answered. Unfortunately, Hospice Home is not able to offer a room today. Hospital care team is aware. [place] Liaison will continue to monitor for room availability.  Please call with any hospice related questions or concerns.  Thank you for the opportunity to participate in this patient's care.  Zandra Abts, RN [facility] Liaison  [phone]

## 2021-01-25 NOTE — Progress Notes (Signed)
PROGRESS NOTE    Lee Mercado  XVQ:[id number] DOB: Dec 28, 1924 DOA: 01/15/2021 PCP: Danae Orleans, MD    Chief Complaint  Patient presents with  . Shortness of Breath    Brief Narrative:  87 yr old male [facility] resident, history of paroxysmal A. fib CHADS2 score >3 on Eliquis, HTN, HLD, essential thrombocytosis (hydroxyurea), macrocytic anemia, chronic dizziness and vertigo, history of a CVA. Recent admit 4/4 through 01/04/2021 - hypoxic respiratory failure 2/2 bronchitis-presumed prior smoker.  Returned to ED via EMS on 4/18 with SOB, dizziness, chronic cough, disoriented.  Found to have WBC 17 BNP 274 troponin 181. Failed swallow eval in ED, is aspirating.   CT head negative except for chronic microvascular ischemic changes, generalized atrophy and multiple old cerebellar infarcts. CXR showed low lung volumes with vascular crowding and streaky basilar atelectasis.  Admitted and started on IV antibiotics for presumed aspiration PNA.   Initially on Rocephin azithromycin >>>Unasyn.  Palliative care consulted for goals of care given patient's advanced age, failure to thrive, and now aspirating.    Patient developed GI bleeding with BRBPR and melena on 4/20.  Heparin drip was stopped.  Given tenuous respiratory status with aspiration, not currently candidate for endoscopic evaluations.  Hemoglobin trend stable and bleeding resolved with stopping anticoagulation.  4/23-25: Patient continues to have dysphagia and high risk for aspiration, although respiratory status has been stable.  Requiring IV hydration for hypernatremia and worsening renal function both secondary to inability to orally hydrate.  Fluids d/c'd 4/25 to assess trend of labs to assist with prognosis and decision making in [place]   Assessment & Plan:   Principal Problem:   Aspiration pneumonia ([place]) Active Problems:   Acute respiratory failure with hypoxia (HCC)   Protein-calorie malnutrition, severe   GI  bleeding   Dysphagia  1 aspiration pneumonia/pneumonitis -Patient presenting with shortness of breath, cough, dizziness, noted to have a leukocytosis white count of 17,000. -Patient noted to be aspirating per speech therapy evaluation. -Status post 7 days IV antibiotics with Unasyn. -Patient with poor oral intake and likely continue aspirating. -Patient with a poor prognosis. -Patient with a worsening leukocytosis white count at 18.2, patient with diffuse rhonchi and coarse breath sounds on examination with some gurgling.  Repeat chest x-ray done this morning with increasing multifocal pneumonia. -Patient initially started on IV Zosyn. -Palliative care following and discussed with patient and family and decision made to transition to full comfort measures..  2.  Dysphagia/aspiration/inadequate oral intake/failure to thrive -Currently on a dysphagia 1 diet with nectar thick liquids. -Patient likely with ongoing aspiration. -Patient with poor oral intake and declining. -Continue aspiration precautions, assistance with meals, supervise all oral intake. -Patient seems to have reaspirated as chest x-ray showed increasing multifocal pneumonia.  Patient with a worsening leukocytosis. -Palliative care following and decision made to transition to full comfort measures.  3.  Generalized weakness/physical debility/FTT -Patient noted to be requiring maximum assistance for all mobility including in the bed. -Patient with minimal to no oral intake. -Patient declining. -Likely not a candidate for skilled nursing facility as the outpatient is NOT going to be able to participate in therapy. -Palliative care following and goals of care discussions ongoing at this time. -Patient hospice appropriate.   -Patient condition declining, deteriorating, likely reaspirated as chest x-ray showing increased multifocal pneumonia, patient with a leukocytosis. -Palliative care met with family and decision made to  transition to full comfort measures.  4. GI bleed--resolved -Patient noted to have had bright red blood  per rectum and melena the morning of 01/17/2021. -Patient noted to be on anticoagulation which was subsequently held. -GI consulted and felt patient would not be a candidate for any endoscopic evaluation at this time due to his respiratory status. -tagged RBC scan was ordered but canceled as bleeding seems to have resolved. -Hemoglobin stabilized and currently at 10.3. -Follow H&H, transfusion threshold hemoglobin < 7. -Continue to hold anticoagulation.  5.  Acute kidney injury -Resolved with hydration currently on during the hospitalization however due to poor oral intake creatinine trending back up currently at 1.51 with an increase in BUN as well.  6.  Lower extremity edema -Felt secondary to IV fluids with diuretics being held. -Likely third spacing.   -Home regimen Lasix has been resumed.   -Patient deteriorating and condition declining  -Palliative care met with patient and family and decision made to transition to full comfort measures.  7.  Hypernatremia -Likely secondary to dehydration secondary to poor oral intake and aspiration pneumonia. -Improved with D5W. -IV fluids have been held. -Patient declining, significantly poor oral intake, high risk for aspirating and likely aspirated again.   -Patient and family have decided to transition to full comfort measures .  8.  Hypokalemia -Repleted.  9.  Sepsis Is ruled out  10.  Demand ischemia -Troponin is noted to be elevated but flattened -EKG done with no ischemic changes noted. -She is seen in consultation by cardiology and was felt patient was not a candidate for invasive evaluation and as such 2D echo deferred. -Asymptomatic. -Patient declining. -Patient with a poor prognosis and being followed by palliative care. -Patient and family have decided to transition to full comfort measures.  11.  Atrial fibrillation  CHADS2 score > 4 -Was on anticoagulation with Eliquis prior to admission. -Anticoagulation discontinued secondary to GI bleed. -Outpatient follow-up to determine whether anticoagulation needs to be resumed post discharge.  12.  Chronic HFpEF -Stable. -2D echo with EF of 55 to 60%(11/11/2017). -Lasix resumed. -Toprol resumed. -Outpatient follow-up. -Patient transitioning to full comfort measures  13.  Essential thrombocytosis/microcytic anemia -Continue hydroxyurea, folic acid. -Outpatient follow-up with hematology.  14.  Adult failure to thrive/recurrent hospitalizations -Patient with recurrent hospitalizations within the past 2 months. -Patient admitted with aspiration with pneumonia with ongoing aspiration. -Patient with minimal to no oral intake. -Patient declining. -Patient with a worsening leukocytosis, coarse breath sounds with diffuse rhonchi and gurgling, repeat chest x-ray done concerning for pneumonia.  Patient likely reaspirated. -Palliative care following and family at bedside and have decided to transition to full comfort measures..  15.  Severe protein calorie malnutrition -Secondary to poor oral intake and aspiration. -Patient declining. -Palliative care following. -Likely hospice eligible at this time. -Family have decided to transition to full comfort measures.  16.  Prognosis -Patient with a poor prognosis. -Patient declining. -Patient chronically aspirating likely has another aspiration pneumonia noted on chest x-ray today with a worsening leukocytosis, diffuse rhonchi, gurgling. -Patient with minimal oral intake. -Family at bedside and as patient declining, not a candidate for rehab have decided to transition to comfort measures. -Palliative care following. -Likely in-hospital death versus residential hospice. -      DVT prophylaxis: SCDs Code Status: DNR Family Communication: Updated son Dominica Severin, daughter-in-law at bedside.   Disposition:   Status  is: Inpatient    Dispo:  Patient From: [place]  Planned Disposition: Residential hospice home versus in-hospital death  Medically stable for discharge: No         Consultants:  Cardiology: Dr. Ubaldo Glassing 01/16/2021  Palliative care: Royden Purl, NP/Dr. Hilma Favors 01/16/2021  Procedures:   CT head 01/16/2021  Chest x-ray 01/15/2021, 01/21/2021, 01/25/2021  Antimicrobials:   IV Unasyn 01/16/2021>>>>> 01/21/2021  IV azithromycin 4/18 /2022x1 dose  IV Rocephin 01/15/2021 x 1 dose  IV Zosyn 01/25/2021>>> 01/25/2021   Subjective: Laying in bed.  Weak.  Gurgling noted.  No chest pain.  No significant shortness of breath.  No abdominal pain.  Son and daughter-in-law at bedside.  Objective: Vitals:   01/25/21 0030 01/25/21 0413 01/25/21 0500 01/25/21 0909  BP: (!) 148/79 (!) 109/55  (!) 160/123  Pulse: 85 78  77  Resp: '19 20  18  ' Temp: 98.3 F (36.8 C) 98.6 F (37 C)  98.2 F (36.8 C)  TempSrc:      SpO2: 94% 94%  94%  Weight:   88.1 kg   Height:        Intake/Output Summary (Last 24 hours) at 01/25/2021 1042 Last data filed at 01/24/2021 2100 Gross per 24 hour  Intake 0 ml  Output 400 ml  Net -400 ml   Filed Weights   01/21/21 0500 01/22/21 0500 01/25/21 0500  Weight: 88.1 kg 85.8 kg 88.1 kg    Examination:  General exam: Dry mucous membranes.  Frail.  Chronically ill appearing. Respiratory system: Coarse diffuse breath sounds with diffuse rhonchi.  Gurgling noted.  No wheezing.   Cardiovascular system: RRR no murmurs rubs or gallops.  No JVD.  2-3 + pedal edema.  Regular r Gastrointestinal system: Abdomen is soft, nondistended, nontender, positive bowel sounds.  No rebound.  No guarding. Central nervous system: Alert.  Moving extremity spontaneously. Extremities: Pedal edema.  Symmetric 5 x 5 power. Skin: No rashes, lesions or ulcers Psychiatry: Judgement and insight appear fair. Mood & affect appropriate.     Data Reviewed: I have personally  reviewed following labs and imaging studies  CBC: Recent Labs  Lab 01/18/21 1346 01/19/21 0427 01/20/21 0335 01/22/21 0413 01/25/21 0534  WBC  --  12.1* 12.5* 11.1* 18.2*  HGB 10.2* 9.6* 9.2* 9.6* 10.3*  HCT 29.9* 27.7* 26.4* 27.5* 29.1*  MCV  --  132.5* 131.3* 129.1* 126.0*  PLT  --  422* 414* 465* 272    Basic Metabolic Panel: Recent Labs  Lab 01/19/21 0427 01/20/21 0335 01/21/21 0412 01/22/21 0413 01/23/21 0434 01/24/21 0322 01/25/21 0534  NA 147*   < > 140 139 138 137 140  K 3.2*   < > 3.5 3.5 3.9 3.7 4.0  CL 108   < > 105 105 105 103 104  CO2 30   < > '27 28 26 25 25  ' GLUCOSE 122*   < > 104* 108* 95 91 125*  BUN 25*   < > '21 18 16 18 ' 24*  CREATININE 1.22   < > 1.04 1.08 1.13 1.02 1.51*  CALCIUM 8.4*   < > 8.1* 7.9* 8.2* 8.2* 8.2*  MG 1.9  --   --  1.9  --   --   --    < > = values in this interval not displayed.    GFR: Estimated Creatinine Clearance: 31.9 mL/min (A) (by C-G formula based on SCr of 1.51 mg/dL (H)).  Liver Function Tests: Recent Labs  Lab 01/19/21 0427  AST 24  ALT 22  ALKPHOS 93  BILITOT 0.8  PROT 5.7*  ALBUMIN 2.3*    CBG: Recent Labs  Lab 01/19/21 1627  GLUCAP 124*     Recent Results (  from the past 240 hour(s))  Resp Panel by RT-PCR (Flu A&B, Covid) Nasopharyngeal Swab     Status: None   Collection Time: 01/15/21 12:12 PM   Specimen: Nasopharyngeal Swab; Nasopharyngeal(NP) swabs in vial transport medium  Result Value Ref Range Status   SARS Coronavirus 2 by RT PCR NEGATIVE NEGATIVE Final    Comment: (NOTE) SARS-CoV-2 target nucleic acids are NOT DETECTED.  The SARS-CoV-2 RNA is generally detectable in upper respiratory specimens during the acute phase of infection. The lowest concentration of SARS-CoV-2 viral copies this assay can detect is 138 copies/mL. A negative result does not preclude SARS-Cov-2 infection and should not be used as the sole basis for treatment or other patient management decisions. A negative  result may occur with  improper specimen collection/handling, submission of specimen other than nasopharyngeal swab, presence of viral mutation(s) within the areas targeted by this assay, and inadequate number of viral copies(<138 copies/mL). A negative result must be combined with clinical observations, patient history, and epidemiological information. The expected result is Negative.  Fact Sheet for Patients:  EntrepreneurPulse.com.au  Fact Sheet for Healthcare Providers:  IncredibleEmployment.be  This test is no t yet approved or cleared by the [place] FDA and  has been authorized for detection and/or diagnosis of SARS-CoV-2 by FDA under an Emergency Use Authorization (EUA). This EUA will remain  in effect (meaning this test can be used) for the duration of the COVID-19 declaration under Section 564(b)(1) of the Act, 21 U.S.C.section 360bbb-3(b)(1), unless the authorization is terminated  or revoked sooner.       Influenza A by PCR NEGATIVE NEGATIVE Final   Influenza B by PCR NEGATIVE NEGATIVE Final    Comment: (NOTE) The Xpert Xpress SARS-CoV-2/FLU/RSV plus assay is intended as an aid in the diagnosis of influenza from Nasopharyngeal swab specimens and should not be used as a sole basis for treatment. Nasal washings and aspirates are unacceptable for Xpert Xpress SARS-CoV-2/FLU/RSV testing.  Fact Sheet for Patients: EntrepreneurPulse.com.au  Fact Sheet for Healthcare Providers: IncredibleEmployment.be  This test is not yet approved or cleared by the [place] FDA and has been authorized for detection and/or diagnosis of SARS-CoV-2 by FDA under an Emergency Use Authorization (EUA). This EUA will remain in effect (meaning this test can be used) for the duration of the COVID-19 declaration under Section 564(b)(1) of the Act, 21 U.S.C. section 360bbb-3(b)(1), unless the authorization is  terminated or revoked.  Performed at [facility], [address] [place], [place] [address]   Blood culture (routine x 2)     Status: None   Collection Time: 01/15/21 12:15 PM   Specimen: BLOOD  Result Value Ref Range Status   Specimen Description BLOOD BLOOD RIGHT HAND  Final   Special Requests   Final    BOTTLES DRAWN AEROBIC AND ANAEROBIC Blood Culture adequate volume   Culture   Final    NO GROWTH 5 DAYS Performed at [facility], [address], [place], [place] [address]    Report Status 01/20/2021 FINAL  Final  Blood culture (routine x 2)     Status: None   Collection Time: 01/15/21  7:02 PM   Specimen: BLOOD  Result Value Ref Range Status   Specimen Description BLOOD LEFT ANTECUBITAL  Final   Special Requests   Final    BOTTLES DRAWN AEROBIC AND ANAEROBIC Blood Culture results may not be optimal due to an inadequate volume of blood received in culture bottles   Culture   Final  NO GROWTH 5 DAYS Performed at [facility], [address] [place], [place] [address]    Report Status 01/20/2021 FINAL  Final  MRSA PCR Screening     Status: None   Collection Time: 01/21/21  3:57 PM   Specimen: Nasal Mucosa; Nasopharyngeal  Result Value Ref Range Status   MRSA by PCR NEGATIVE NEGATIVE Final    Comment:        The GeneXpert MRSA Assay (FDA approved for NASAL specimens only), is one component of a comprehensive MRSA colonization surveillance program. It is not intended to diagnose MRSA infection nor to guide or monitor treatment for MRSA infections. Performed at [facility], [address], [place], [place] [address]          Radiology Studies: DG Chest [place] 1 View  Result Date: 01/25/2021 CLINICAL DATA:  Leukocytosis with concern for aspiration EXAM: PORTABLE CHEST 1 VIEW COMPARISON:  January 21, 2021 FINDINGS: Airspace opacity is noted in portions of the mid and lower lung regions on the right as well as to a lesser  extent in the left base. There is atelectatic change in the left base. No consolidation. Heart size and pulmonary vascular within normal limits. No adenopathy. No bone lesions. IMPRESSION: Patchy airspace opacity right mid and lower lung regions, increased. Subtle airspace opacity left base with left base atelectasis. Suspect multifocal pneumonia, although aspiration could present similarly. Both aspiration in pneumonia may present concurrently. Stable cardiac silhouette. Electronically Signed   By: Lowella Grip III M.D.   On: 01/25/2021 08:16        Scheduled Meds: . feeding supplement (NEPRO CARB STEADY)  237 mL Oral Q24H  . furosemide  20 mg Oral Daily  . hydroxyurea  500 mg Oral Daily  . levothyroxine  50 mcg Oral Daily  . metoprolol tartrate  12.5 mg Oral BID  . multivitamin with minerals  1 tablet Oral Daily   Continuous Infusions: . sodium chloride 10 mL/hr at 01/21/21 0818  . dextrose 5 % and 0.45% NaCl 75 mL/hr at 01/25/21 0821  . piperacillin-tazobactam (ZOSYN)  IV       LOS: 10 days    Time spent: 35 minutes    Irine Seal, MD [facility]   To contact the attending provider between 7A-7P or the covering provider during after hours 7P-7A, please log into the web site [email or web address] and access using universal [place] password for that web site. If you do not have the password, please call the hospital operator.  01/25/2021, 10:42 AM

## 2021-01-25 NOTE — Progress Notes (Signed)
OT Cancellation Note  Patient Details Name: Lee Mercado MRN: [id number] DOB: [birth year]   Cancelled Treatment:    Reason Eval/Treat Not Completed: Other (comment). Per chart, pt transitioning to comfort care. Will sign off for OT. Please re-consult if additional acute OT needs arise.    Hanley Hays, MPH, MS, OTR/L ascom [phone] 01/25/21, 11:40 AM

## 2021-01-25 NOTE — Consult Note (Signed)
Pharmacy Antibiotic Note  Lee Mercado is a 87 y.o. male admitted on 01/15/2021 with aspiration pneumonia   Pharmacy has been consulted for Zosyn dosing.  Plan: Zosyn 3.375g IV q8h (4 hour infusion).  Height: 5\' 11"  (180.3 cm) Weight: 88.1 kg (194 lb 3.6 oz) IBW/kg (Calculated) : 75.3  Temp (24hrs), Avg:98.2 F (36.8 C), Min:97.6 F (36.4 C), Max:98.6 F (37 C)  Recent Labs  Lab 01/19/21 0427 01/20/21 0335 01/21/21 0412 01/22/21 0413 01/23/21 0434 01/24/21 0322 01/25/21 0534  WBC 12.1* 12.5*  --  11.1*  --   --  18.2*  CREATININE 1.22 1.30* 1.04 1.08 1.13 1.02 1.51*    Estimated Creatinine Clearance: 31.9 mL/min (A) (by C-G formula based on SCr of 1.51 mg/dL (H)).    No Known Allergies  Antimicrobials this admission: Unasyn 4/18>4/24 Zosyn 4/28  Dose adjustments this admission: N/A  Microbiology results: No culture growth  Thank you for allowing pharmacy to be a part of this patient's care.  Lu Duffel, PharmD, BCPS Clinical Pharmacist 01/25/2021 10:27 AM

## 2021-01-26 DIAGNOSIS — Z66 Do not resuscitate: Secondary | ICD-10-CM

## 2021-01-26 DIAGNOSIS — D72829 Elevated white blood cell count, unspecified: Secondary | ICD-10-CM

## 2021-01-26 MED ORDER — GLYCOPYRROLATE 0.2 MG/ML IJ SOLN
0.3000 mg | INTRAMUSCULAR | Status: DC | PRN
  Administered 2021-01-27: 05:00:00 0.3 mg via INTRAVENOUS
  Filled 2021-01-26 (×2): qty 2

## 2021-01-26 MED ORDER — GLYCOPYRROLATE 0.2 MG/ML IJ SOLN
0.4000 mg | Freq: Three times a day (TID) | INTRAMUSCULAR | Status: DC
  Administered 2021-01-26 – 2021-01-27 (×4): 0.4 mg via INTRAVENOUS
  Filled 2021-01-26 (×3): qty 2

## 2021-01-26 NOTE — Consult Note (Signed)
Pharmacy Antibiotic Note  Lee Mercado is a 87 y.o. male admitted on 01/15/2021 with aspiration pneumonia   Pharmacy has been consulted for Zosyn dosing.  Patient is comfort care.  Plan: Zosyn 3.375g IV q8h (4 hour infusion).    Per palliative note: Will continue IV antibiotics for symptom management in the setting of aspiration pneumonia    Height: 5\' 11"  (180.3 cm) Weight: 88.1 kg (194 lb 3.6 oz) IBW/kg (Calculated) : 75.3  Temp (24hrs), Avg:98.1 F (36.7 C), Min:98 F (36.7 C), Max:98.2 F (36.8 C)  Recent Labs  Lab 01/20/21 0335 01/21/21 0412 01/22/21 0413 01/23/21 0434 01/24/21 0322 01/25/21 0534  WBC 12.5*  --  11.1*  --   --  18.2*  CREATININE 1.30* 1.04 1.08 1.13 1.02 1.51*    Estimated Creatinine Clearance: 31.9 mL/min (A) (by C-G formula based on SCr of 1.51 mg/dL (H)).    No Known Allergies  Antimicrobials this admission: Unasyn 4/18>4/24 Zosyn 4/28 >>  Dose adjustments this admission: N/A  Microbiology results: No culture growth  Thank you for allowing pharmacy to be a part of this patient's care.  Lu Duffel, PharmD, BCPS Clinical Pharmacist 01/26/2021 7:45 AM

## 2021-01-26 NOTE — Progress Notes (Signed)
PROGRESS NOTE    Lee Mercado  IPJ:[id number] DOB: [birth year] DOA: 01/15/2021 PCP: Danae Orleans, MD    Chief Complaint  Patient presents with  . Shortness of Breath    Brief Narrative:  87 yr old male [facility] resident, history of paroxysmal A. fib CHADS2 score >3 on Eliquis, HTN, HLD, essential thrombocytosis (hydroxyurea), macrocytic anemia, chronic dizziness and vertigo, history of a CVA. Recent admit 4/4 through 01/04/2021 - hypoxic respiratory failure 2/2 bronchitis-presumed prior smoker.  Returned to ED via EMS on 4/18 with SOB, dizziness, chronic cough, disoriented.  Found to have WBC 17 BNP 274 troponin 181. Failed swallow eval in ED, is aspirating.   CT head negative except for chronic microvascular ischemic changes, generalized atrophy and multiple old cerebellar infarcts. CXR showed low lung volumes with vascular crowding and streaky basilar atelectasis.  Admitted and started on IV antibiotics for presumed aspiration PNA.   Initially on Rocephin azithromycin >>>Unasyn.  Palliative care consulted for goals of care given patient's advanced age, failure to thrive, and now aspirating.    Patient developed GI bleeding with BRBPR and melena on 4/20.  Heparin drip was stopped.  Given tenuous respiratory status with aspiration, not currently candidate for endoscopic evaluations.  Hemoglobin trend stable and bleeding resolved with stopping anticoagulation.  4/23-25: Patient continues to have dysphagia and high risk for aspiration, although respiratory status has been stable.  Requiring IV hydration for hypernatremia and worsening renal function both secondary to inability to orally hydrate.  Fluids d/c'd 4/25 to assess trend of labs to assist with prognosis and decision making in [place]   Assessment & Plan:   Principal Problem:   Aspiration pneumonia ([place]) Active Problems:   Acute respiratory failure with hypoxia (HCC)   Protein-calorie malnutrition, severe   GI  bleeding   Dysphagia  1 aspiration pneumonia/pneumonitis -Patient presenting with shortness of breath, cough, dizziness, noted to have a leukocytosis white count of 17,000. -Patient noted to be aspirating per speech therapy evaluation. -Status post 7 days IV antibiotics with Unasyn. -Patient with poor oral intake and likely continue aspirating. -Patient with a poor prognosis. -Patient with a worsening leukocytosis white count at 18.2, patient with diffuse rhonchi and coarse breath sounds on examination with some gurgling on 01/25/2021.  Repeat chest x-ray done (01/25/2021) with increasing multifocal pneumonia. -Patient initially started on IV Zosyn. -Palliative care following and discussed with family and decision made to transition to full comfort measures. -Discontinue IV Zosyn. -End-of-life order set ordered. -Palliative care following. -Awaiting residential hospice home placement.  2.  Dysphagia/aspiration/inadequate oral intake/failure to thrive -Currently on a dysphagia 1 diet with nectar thick liquids. -Patient likely with ongoing aspiration. -Patient with poor oral intake and declining. -Continue aspiration precautions, assistance with meals, supervise all oral intake. -Patient seems to have reaspirated as chest x-ray showed increasing multifocal pneumonia.  Patient with a worsening leukocytosis. -Palliative care following and decision made to transition to full comfort measures.  3.  Generalized weakness/physical debility/FTT -Patient noted to be requiring maximum assistance for all mobility including in the bed. -Patient with minimal to no oral intake. -Patient declining. -Likely not a candidate for skilled nursing facility as the outpatient is NOT going to be able to participate in therapy. -Patient hospice appropriate.   -Patient condition declining, deteriorating, likely reaspirated as repeat chest x-ray showing increased multifocal pneumonia, patient with a  leukocytosis. -Palliative care met with family and decision made to transition to full comfort measures. -Likely in-hospital death versus residential hospice home placement.  4. GI bleed--resolved -Patient noted to have had bright red blood per rectum and melena the morning of 01/17/2021. -Patient noted to be on anticoagulation which was subsequently discontinued. -GI consulted and felt patient would not be a candidate for any endoscopic evaluation at this time due to his respiratory status. -tagged RBC scan was ordered but canceled as bleeding seems to have resolved. -Hemoglobin stabilized and currently at 10.3. -Anticoagulation discontinued. -Patient transitioned to full comfort measures.  5.  Acute kidney injury -Resolved with hydration currently on during the hospitalization however due to poor oral intake creatinine trending up to 1.51.  . -Patient has been transitioned to full comfort measures.    6.  Lower extremity edema -Felt secondary to IV fluids with diuretics being held. -Likely third spacing.   -Home regimen Lasix has been resumed but subsequently discontinued..   -Patient deteriorating and condition declining  -Palliative care met with patient and family and decision made to transition to full comfort measures.  7.  Hypernatremia -Likely secondary to dehydration secondary to poor oral intake and aspiration pneumonia. -Improved with D5W. -IV fluids have been held. -Patient declining, significantly poor oral intake, high risk for aspirating and likely aspirated again.   -Patient and family have decided to transition to full comfort measures .  8.  Hypokalemia -Repleted.  9.  Sepsis Is ruled out  10.  Demand ischemia -Troponin is noted to be elevated but flattened -EKG done with no ischemic changes noted. -Patient was seen in consultation by cardiology and was felt patient was not a candidate for invasive evaluation and as such 2D echo  deferred. -Asymptomatic. -Patient declining. -Patient with a poor prognosis and being followed by palliative care. -Patient and family have decided to transition to full comfort measures.  11.  Atrial fibrillation CHADS2 score > 4 -Was on anticoagulation with Eliquis prior to admission. -Anticoagulation discontinued secondary to GI bleed. -Patient being followed by palliative care and patient has been transitioned to full comfort measures.  12.  Chronic HFpEF -Stable. -2D echo with EF of 55 to 60%(11/11/2017). -, Lasix discontinued.   -Patient has been transitioned to full comfort measures.   13.  Essential thrombocytosis/microcytic anemia -Patient has been transitioned to full comfort measures.  14.  Adult failure to thrive/recurrent hospitalizations -Patient with recurrent hospitalizations within the past 2 months. -Patient admitted with aspiration with pneumonia with ongoing aspiration. -Patient with minimal to no oral intake. -Patient declining. -Patient with a worsening leukocytosis, coarse breath sounds with diffuse rhonchi and gurgling, repeat chest x-ray done concerning for pneumonia.  Patient likely reaspirated. -Palliative care following and family have decided to transition to full comfort measures. -Discontinue IV Zosyn. -Patient currently comfortable. -End-of-life order set ordered. -Awaiting placement in residential hospice home.  15.  Severe protein calorie malnutrition -Secondary to poor oral intake and aspiration. -Patient declining. -Palliative care following. -Family have decided to transition to full comfort measures.   -Awaiting placement in residential hospice home.    16.  Prognosis -Patient with a poor prognosis. -Patient declining. -Patient chronically aspirating likely has another aspiration pneumonia noted on chest x-ray today with a worsening leukocytosis, diffuse rhonchi, gurgling. -Patient with minimal oral intake. -Patient not a candidate for  rehab.   -Palliative care have been following the patient throughout the hospitalization and family decided to transition to full comfort measures.   -End-of-life order set ordered  -Palliative care following for  -Likely in-hospital death versus residential hospice home.  -      DVT prophylaxis:  SCDs Code Status: DNR Family Communication: No family at bedside. Disposition:   Status is: Inpatient    Dispo:  Patient From: [facility]  Planned Disposition: Residential hospice home versus in-hospital death  Medically stable for discharge: No         Consultants:   Cardiology: Dr. Ubaldo Glassing 01/16/2021  Palliative care: Royden Purl, NP/Dr. Hilma Favors 01/16/2021  Procedures:   CT head 01/16/2021  Chest x-ray 01/15/2021, 01/21/2021, 01/25/2021  Antimicrobials:   IV Unasyn 01/16/2021>>>>> 01/21/2021  IV azithromycin 4/18 /2022x1 dose  IV Rocephin 01/15/2021 x 1 dose  IV Zosyn 01/25/2021>>> 01/26/2021   Subjective: Lethargic. Resting comfortably.  Objective: Vitals:   01/25/21 0413 01/25/21 0500 01/25/21 0909 01/26/21 0405  BP: (!) 109/55  (!) 160/123 103/61  Pulse: 78  77 80  Resp: 20  18 (!) 28  Temp: 98.6 F (37 C)  98.2 F (36.8 C) 98 F (36.7 C)  TempSrc:      SpO2: 94%  94% (!) 83%  Weight:  88.1 kg    Height:        Intake/Output Summary (Last 24 hours) at 01/26/2021 1118 Last data filed at 01/26/2021 0738 Gross per 24 hour  Intake 432.2 ml  Output 800 ml  Net -367.8 ml   Filed Weights   01/21/21 0500 01/22/21 0500 01/25/21 0500  Weight: 88.1 kg 85.8 kg 88.1 kg    Examination:  General exam: Dry mucous membranes.  Lethargic.  Frail.  Chronically ill-appearing.  Respiratory system: Coarse breath sounds anterior lung fields.  No gurgling noted.  No wheezing.  Cardiovascular system: Regular rate rhythm no murmurs rubs or gallops.  Trace pedal edema.  Gastrointestinal system: Abdomen is soft, nondistended, nontender, positive bowel sounds.   No rebound.  No guarding.  Central nervous system: Lethargic.   Extremities: Trace pedal edema.. Skin: No rashes, lesions or ulcers Psychiatry: Judgement and insight appear fair. Mood & affect appropriate.     Data Reviewed: I have personally reviewed following labs and imaging studies  CBC: Recent Labs  Lab 01/20/21 0335 01/22/21 0413 01/25/21 0534  WBC 12.5* 11.1* 18.2*  HGB 9.2* 9.6* 10.3*  HCT 26.4* 27.5* 29.1*  MCV 131.3* 129.1* 126.0*  PLT 414* 465* 264    Basic Metabolic Panel: Recent Labs  Lab 01/21/21 0412 01/22/21 0413 01/23/21 0434 01/24/21 0322 01/25/21 0534  NA 140 139 138 137 140  K 3.5 3.5 3.9 3.7 4.0  CL 105 105 105 103 104  CO2 27 28 26 25 25   GLUCOSE 104* 108* 95 91 125*  BUN 21 18 16 18  24*  CREATININE 1.04 1.08 1.13 1.02 1.51*  CALCIUM 8.1* 7.9* 8.2* 8.2* 8.2*  MG  --  1.9  --   --   --     GFR: Estimated Creatinine Clearance: 31.9 mL/min (A) (by C-G formula based on SCr of 1.51 mg/dL (H)).  Liver Function Tests: No results for input(s): AST, ALT, ALKPHOS, BILITOT, PROT, ALBUMIN in the last 168 hours.  CBG: Recent Labs  Lab 01/19/21 1627  GLUCAP 124*     Recent Results (from the past 240 hour(s))  MRSA PCR Screening     Status: None   Collection Time: 01/21/21  3:57 PM   Specimen: Nasal Mucosa; Nasopharyngeal  Result Value Ref Range Status   MRSA by PCR NEGATIVE NEGATIVE Final    Comment:        The GeneXpert MRSA Assay (FDA approved for NASAL specimens only), is one component of a comprehensive MRSA  colonization surveillance program. It is not intended to diagnose MRSA infection nor to guide or monitor treatment for MRSA infections. Performed at [facility], [address], [place], [place] [address]          Radiology Studies: DG Chest [place] 1 View  Result Date: 01/25/2021 CLINICAL DATA:  Leukocytosis with concern for aspiration EXAM: PORTABLE CHEST 1 VIEW COMPARISON:  January 21, 2021 FINDINGS: Airspace  opacity is noted in portions of the mid and lower lung regions on the right as well as to a lesser extent in the left base. There is atelectatic change in the left base. No consolidation. Heart size and pulmonary vascular within normal limits. No adenopathy. No bone lesions. IMPRESSION: Patchy airspace opacity right mid and lower lung regions, increased. Subtle airspace opacity left base with left base atelectasis. Suspect multifocal pneumonia, although aspiration could present similarly. Both aspiration in pneumonia may present concurrently. Stable cardiac silhouette. Electronically Signed   By: Lowella Grip III M.D.   On: 01/25/2021 08:16        Scheduled Meds: . glycopyrrolate  0.4 mg Intravenous Q4H  . scopolamine  1 patch Transdermal Q72H   Continuous Infusions: . sodium chloride 10 mL/hr at 01/21/21 0818  . piperacillin-tazobactam (ZOSYN)  IV 12.5 mL/hr at 01/26/21 0738     LOS: 11 days    Time spent: 35 minutes    Irine Seal, MD [facility]   To contact the attending provider between 7A-7P or the covering provider during after hours 7P-7A, please log into the web site [email or web address] and access using universal [place] password for that web site. If you do not have the password, please call the hospital operator.  01/26/2021, 11:18 AM

## 2021-01-26 NOTE — Progress Notes (Signed)
[facility]) [facility] Liaison Note  Unfortunately Hospice home does not have any beds to offer today. Visited patient at bedside, no family present. Patient resting with eyes closed. Reached out and left message for son Dominica Severin. Hospice eligibility confirmed.   [facility] liaison will continue to follow for room availability.   Please call with any Hospice related questions or concerns.   Jhonnie Garner, Therapist, sports, [facility] Liaison [phone]

## 2021-01-26 NOTE — Progress Notes (Signed)
   Daily Progress Note   Patient Name: Lee Mercado       Date: 01/26/2021 DOB: 12-23-1924  Age: 87 y.o. MRN#: [id number] Attending Physician: Eugenie Filler, MD Primary Care Physician: Danae Orleans, MD Admit Date: 01/15/2021  Reason for Consultation/Follow-up: Establishing goals of care  Subjective:  Reviewed.  Updates received.  Patient assessed.  No acute distress noted. Nurse Techs are at the bedside providing care and repositioning. Patient appears much more comfortable today with minimal respiratory distress. Remains lethargic. Poor oral nutrition with minimal to no intake.   No family is at the bedside.   Son updated and verbalized understanding. Reviewed goals of care and end-of-life expectations. Education provided on continued comfort care focus and hospice home transfer once bed is available. Family also aware patient could pass away while hospitalized depending on condition and bed availability.   All questions answered and support provided.  Length of Stay: 11 days  Vital Signs: BP 103/61 (BP Location: Left Arm)   Pulse 80   Temp 98 F (36.7 C)   Resp (!) 28   Ht 5\' 11"  (1.803 m)   Wt 88.1 kg   SpO2 (!) 83%   BMI 27.09 kg/m  SpO2: SpO2: (!) 83 % O2 Device: O2 Device: Nasal Cannula O2 Flow Rate: O2 Flow Rate (L/min): 2 L/min  Physical Exam: NAD, lethargic, cachectic, chronically ill-appearing RRR Diminished bilaterally, coarse  Lethargic, will not follow commands          Palliative Care Assessment & Plan   Goals of Care/Recommendations:  Continue all care to focus on comfort. No escalation of care. Treat symptoms aggressively with PRN medications.   Son updated and continued education on EOL expectations and hospice home transfer. Family aware there are currently no hospice beds available.   Decrease robinul to TID and as needed for secretions.   PMT will continue to support and follow as needed.   Prognosis: Days-Weeks  Discharge Planning:  Hospice facility  Thank you for allowing the Palliative Medicine Team to assist in the care of this patient.  Time Total: 25 min.   Visit consisted of counseling and education dealing with the complex and emotionally intense issues of symptom management and palliative care in the setting of serious and potentially life-threatening illness.Greater than 50%  of this time was spent counseling and coordinating care related to the above assessment and plan.  Alda Lea, AGPCNP-BC  Palliative Medicine Team [phone]

## 2021-01-27 DIAGNOSIS — E870 Hyperosmolality and hypernatremia: Secondary | ICD-10-CM

## 2021-01-27 DIAGNOSIS — D72829 Elevated white blood cell count, unspecified: Secondary | ICD-10-CM

## 2021-01-27 DIAGNOSIS — R627 Adult failure to thrive: Secondary | ICD-10-CM

## 2021-01-27 DIAGNOSIS — E876 Hypokalemia: Secondary | ICD-10-CM

## 2021-01-27 DIAGNOSIS — I482 Chronic atrial fibrillation, unspecified: Secondary | ICD-10-CM

## 2021-01-27 MED ORDER — SCOPOLAMINE 1 MG/3DAYS TD PT72: 1.0000 | MEDICATED_PATCH | TRANSDERMAL | 12 refills | Status: AC

## 2021-01-27 MED ORDER — LORAZEPAM 2 MG/ML PO CONC: 0.6000 mg | Freq: Three times a day (TID) | ORAL | 0 refills | Status: AC | PRN

## 2021-01-27 MED ORDER — POLYVINYL ALCOHOL 1.4 % OP SOLN: 1.0000 [drp] | Freq: Four times a day (QID) | OPHTHALMIC | 0 refills | Status: AC | PRN

## 2021-01-27 NOTE — Discharge Summary (Signed)
Physician Discharge Summary  Lee Mercado EGB:[id number] DOB: March 31, 1925 DOA: 01/15/2021  PCP: Danae Orleans, MD  Admit date: 01/15/2021 Discharge date: 01/27/2021  Time spent: 50 minutes  Recommendations for Outpatient Follow-up:  1. Patient will be discharged to residential hospice home.  Follow-up with MD at residential hospice home.   Discharge Diagnoses:  Principal Problem:   Aspiration pneumonia ([place]) Active Problems:   Acute respiratory failure with hypoxia (HCC)   Severe protein-calorie malnutrition (HCC)   GI bleeding   Dysphagia   Leukocytosis   Hypernatremia   Hypokalemia   FTT (failure to thrive) in adult   Discharge Condition: Stable  Diet recommendation: Dysphagia 1 diet with nectar thick liquids  Filed Weights   01/21/21 0500 01/22/21 0500 01/25/21 0500  Weight: 88.1 kg 85.8 kg 88.1 kg    History of present illness:  HPI per Dr. Samuel Bouche is a 87 y.o. male with medical history significant for essential thrombocytosis, macrocytic anemia, hypothyroidism, CVA, paroxysmal A. fib on Eliquis, chronic dizziness and vertigo who presented to [facility] ED from SNF due to shortness of breath, productive cough, and hypoxemia, 86% on room air, reported by EDP.  Unable to obtain history from the patient due to confusion.  History is mainly obtained from EDP and review of medical records.  Patient was recently discharged from the hospital to SNF about 2 weeks ago after admission for acute on chronic bronchitis.  Returned with complaint of shortness of breath and a productive cough.  No fevers or chills.  No reported chest pain.  His symptoms worsened overnight.  Chest x-ray unrevealing however patient failed a swallow evaluation at bedside in the ED.  Made n.p.o. due to concern for aspiration.  He received Rocephin and azithromycin in the ED.  Started on Unasyn for suspected aspiration pneumonia.  TRH, hospitalist team, was asked to admit.  Dr. Nevada Crane called and spoke with  the patient's son, Dominica Severin, via phone.  No prior history of dementia.  Due to his confusion will obtain a CT head to rule out any acute intracranial abnormalities.  Patient is full code per his son Dominica Severin via phone.  ED Course:  Afebrile.  BP 111/54, pulse 85, respiration rate 18, O2 saturation 100% on 3 L.  Lab studies remarkable for WBC 17,000, hemoglobin 11.5, MCV 128, platelet count 427.  Troponin 181, BNP 274.  Hospital Course:  1 aspiration pneumonia/pneumonitis -Patient presented with shortness of breath, cough, dizziness, noted to have a leukocytosis white count of 17,000. -Patient noted to be aspirating per speech therapy evaluation. -Status post 7 days IV antibiotics with Unasyn. -Patient with poor oral intake and likely continue aspirating. -Patient with a poor prognosis. -Patient with a worsening leukocytosis white count at 18.2, patient with diffuse rhonchi and coarse breath sounds on examination with some gurgling on 01/25/2021.  Repeat chest x-ray done (01/25/2021) with increasing multifocal pneumonia. -Patient initially started on IV Zosyn which was subsequently discontinued. -Palliative care following and discussed with family and decision made to transition to full comfort measures. -End-of-life order set ordered. -Patient will be discharged to residential hospice home.  2.  Dysphagia/aspiration/inadequate oral intake/failure to thrive -Patient placed on a dysphagia 1 diet with nectar thick liquids. -Patient likely with ongoing aspiration. -Patient with poor oral intake and declining. -Continue aspiration precautions, assistance with meals, supervise all oral intake. -Patient seems to have reaspirated as chest x-ray showed increasing multifocal pneumonia.  Patient with a worsening leukocytosis. -Palliative care following and decision made to transition to  full comfort measures.  3.  Generalized weakness/physical debility/FTT -Patient noted to be requiring maximum assistance  for all mobility including in the bed. -Patient with minimal to no oral intake. -Patient declining. -Likely not a candidate for skilled nursing facility as the outpatient is NOT going to be able to participate in therapy. -Patient hospice appropriate.   -Patient condition declining, deteriorating, likely reaspirated as repeat chest x-ray showing increased multifocal pneumonia, patient with a leukocytosis. -Palliative care met with family and decision made to transition to full comfort measures. -Patient will be discharged to residential hospice home.  4. GI bleed--resolved -Patient noted to have had bright red blood per rectum and melena the morning of 01/17/2021. -Patient noted to be on anticoagulation which was subsequently discontinued. -GI consulted and felt patient would not be a candidate for any endoscopic evaluation at this time due to his respiratory status. -tagged RBC scan was ordered but canceled as bleeding seems to have resolved. -Hemoglobin stabilized at 10.3 (4/28). -Anticoagulation discontinued. -Patient transitioned to full comfort measures.  5.  Acute kidney injury -Resolved with hydration earlier on during the hospitalization however due to poor oral intake creatinine trending up to 1.51.  . -Patient has been transitioned to full comfort measures.    6.  Lower extremity edema -Felt secondary to IV fluids with diuretics being held. -Likely third spacing.   -Home regimen Lasix was resumed but subsequently discontinued..   -Patient deteriorating and condition declining  -Palliative care met with patient and family and decision made to transition to full comfort measures.  7.  Hypernatremia -Likely secondary to dehydration secondary to poor oral intake and aspiration pneumonia. -Improved with D5W. -IV fluids have been held. -Patient declining, significantly poor oral intake, high risk for aspirating and likely aspirated again.   -Patient and family have decided to  transition to full comfort measures .  8.  Hypokalemia -Repleted.  9.  Sepsis Is ruled out  10.  Demand ischemia -Troponin was noted to be elevated but flattened -EKG done with no ischemic changes noted. -Patient was seen in consultation by cardiology and was felt patient was not a candidate for invasive evaluation and as such 2D echo deferred. -Asymptomatic. -Patient declining. -Patient with a poor prognosis and being followed by palliative care. -Patient and family have decided to transition to full comfort measures.  11.  Chronic Atrial fibrillation CHADS2 score > 4 -Was on anticoagulation with Eliquis prior to admission. -Anticoagulation discontinued secondary to GI bleed. -Patient being followed by palliative care and patient has been transitioned to full comfort measures.  12.  Chronic HFpEF -Stable. -2D echo with EF of 55 to 60%(11/11/2017). -, Lasix discontinued.   -Patient transitioned to full comfort measures.   13.  Essential thrombocytosis/microcytic anemia -Patient transitioned to full comfort measures.  14.  Adult failure to thrive/recurrent hospitalizations -Patient with recurrent hospitalizations within the past 2 months. -Patient admitted with aspiration pneumonia with ongoing aspiration. -Patient with minimal to no oral intake. -Patient declining. -Patient with a worsening leukocytosis, coarse breath sounds with diffuse rhonchi and gurgling, repeat chest x-ray done concerning for pneumonia.  Patient likely reaspirated. -Palliative care following and family have decided to transition to full comfort measures. -Patient initially placed back on IV Zosyn however due to decision to transition to full comfort measures IV Zosyn subsequently discontinued.   -Patient will be discharged to residential hospice home.   15.  Severe protein calorie malnutrition -Secondary to poor oral intake and aspiration. -Patient declining. -Palliative care following. -Family    have decided to transition to full comfort measures.   -Patient will be discharged to residential hospice home.  16.  Prognosis -Patient with a poor prognosis. -Patient declining. -Patient chronically aspirating with another aspiration pneumonia noted on chest x-ray  with a worsening leukocytosis, diffuse rhonchi, gurgling. -Patient with minimal oral intake. -Patient not a candidate for rehab.   -Palliative care have been following the patient throughout the hospitalization and family decided to transition to full comfort measures.   -End-of-life order set ordered  -Patient will be discharged to residential hospice home. -   Procedures:  CT head 01/16/2021  Chest x-ray 01/15/2021, 01/21/2021, 01/25/2021   Consultations:  Cardiology: Dr. Fath 01/16/2021  Palliative care: Shea Schafer, NP/Dr. Golding 01/16/2021   Discharge Exam: Vitals:   01/26/21 1700 01/27/21 1156  BP: (!) 105/58 (!) 138/33  Pulse: 78 96  Resp: (!) 25 15  Temp: 98.1 F (36.7 C) 97.7 F (36.5 C)  SpO2: (!) 87% (!) 83%    General: Frail.  Chronically ill-appearing. Cardiovascular: Regular rate rhythm no murmurs rubs or gallops. Respiratory: Clear to auscultation anterior lung fields.  Discharge Instructions   Discharge Instructions    Diet - low sodium heart healthy   Complete by: As directed    Dysphagia 1 diet with nectar thick liquids   Discharge wound care:   Complete by: As directed    As above   Increase activity slowly   Complete by: As directed      Allergies as of 01/27/2021   No Known Allergies     Medication List    STOP taking these medications   cyanocobalamin 1000 MCG tablet   dextromethorphan-guaiFENesin 30-600 MG 12hr tablet Commonly known as: MUCINEX DM   diclofenac sodium 1 % Gel Commonly known as: VOLTAREN   Eliquis 2.5 MG Tabs tablet Generic drug: apixaban   furosemide 20 MG tablet Commonly known as: LASIX   hydroxyurea 500 MG capsule Commonly known as:  HYDREA   levothyroxine 50 MCG tablet Commonly known as: SYNTHROID   meclizine 25 MG tablet Commonly known as: ANTIVERT   metoprolol tartrate 25 MG tablet Commonly known as: LOPRESSOR   multivitamin tablet   potassium chloride SA 20 MEQ tablet Commonly known as: KLOR-CON   pravastatin 20 MG tablet Commonly known as: PRAVACHOL   Vitamin D 50 MCG (2000 UT) Caps     TAKE these medications   acetaminophen 500 MG tablet Commonly known as: TYLENOL Take 500 mg by mouth every 6 (six) hours as needed for mild pain.   LORazepam 2 MG/ML concentrated solution Commonly known as: ATIVAN Take 0.3 mLs (0.6 mg total) by mouth every 8 (eight) hours as needed for anxiety or sedation.   polyvinyl alcohol 1.4 % ophthalmic solution Commonly known as: LIQUIFILM TEARS Place 1 drop into both eyes 4 (four) times daily as needed for dry eyes.   scopolamine 1 MG/3DAYS Commonly known as: TRANSDERM-SCOP Place 1 patch (1.5 mg total) onto the skin every 3 (three) days. Start taking on: Jan 28, 2021            Discharge Care Instructions  (From admission, onward)         Start     Ordered   01/27/21 0000  Discharge wound care:       Comments: As above   01/27/21 1525         No Known Allergies  Follow-up Information    MD AT HOSPICE HOME Follow up.                  The results of significant diagnostics from this hospitalization (including imaging, microbiology, ancillary and laboratory) are listed below for reference.    Significant Diagnostic Studies: DG Chest 1 View  Result Date: 01/01/2021 CLINICAL DATA:  87-year-old male status post fall EXAM: CHEST  1 VIEW COMPARISON:  None. FINDINGS: Very low inspiratory volumes. Cardiac and mediastinal contours are within normal limits. Mild interstitial prominence and bronchial wall thickening are favored to be chronic in nature. Gaseous distension of the colon and small bowel. Left basilar patchy airspace opacity is nonspecific. No acute  osseous abnormality. IMPRESSION: 1. Very low inspiratory volumes with probable left basilar atelectasis versus scarring. 2. Probable chronic bronchitic changes and interstitial prominence. 3. Mild gaseous distension of colon and small bowel in the visualized upper abdomen. 4. No acute fracture visualized. Electronically Signed   By: Heath  McCullough M.D.   On: 01/01/2021 07:48   CT HEAD WO CONTRAST  Result Date: 01/16/2021 CLINICAL DATA:  Delirium EXAM: CT HEAD WITHOUT CONTRAST TECHNIQUE: Contiguous axial images were obtained from the base of the skull through the vertex without intravenous contrast. COMPARISON:  None. FINDINGS: Brain: There is no mass, hemorrhage or extra-axial collection. There is generalized atrophy without lobar predilection. Hypodensity of the white matter is most commonly associated with chronic microvascular disease. There are multiple old cerebellar infarcts. Vascular: No abnormal hyperdensity of the major intracranial arteries or dural venous sinuses. No intracranial atherosclerosis. Skull: The visualized skull base, calvarium and extracranial soft tissues are normal. Sinuses/Orbits: No fluid levels or advanced mucosal thickening of the visualized paranasal sinuses. No mastoid or middle ear effusion. The orbits are normal. IMPRESSION: 1. No acute intracranial abnormality. 2. Chronic microvascular ischemia and generalized atrophy. 3. Multiple old cerebellar infarcts. Electronically Signed   By: Kevin  Herman M.D.   On: 01/16/2021 02:55   CT Head Wo Contrast  Result Date: 01/01/2021 CLINICAL DATA:  Recent falls EXAM: CT HEAD WITHOUT CONTRAST TECHNIQUE: Contiguous axial images were obtained from the base of the skull through the vertex without intravenous contrast. COMPARISON:  Head CT November 11, 2017 and brain MRI November 12, 2017 FINDINGS: Brain: Mild diffuse atrophy is stable. There is no intracranial mass, hemorrhage, extra-axial fluid collection, or midline shift. Scattered  small prior cerebellar infarcts are stable in appearance. There is small vessel disease in the centra semiovale bilaterally, stable. No acute appearing infarct is evident on this study. Vascular: No hyperdense vessel. There is calcification in each carotid siphon region. Skull: Bony calvarium appears intact. Sinuses/Orbits: There is a small retention cyst in mid left ethmoid region. Other visualized paranasal sinuses are clear. Apparent prior cataract removal on the left. Orbits otherwise appear symmetric bilaterally. Other: Visualized mastoid air cells are clear. 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 No acute infarct evident. No mass or hemorrhage. There are foci of arterial vascular calcification. Small retention cyst in a mid left ethmoid air cell. Electronically Signed   By: William  Woodruff III M.D.   On: 01/01/2021 08:58   CT Chest Wo Contrast  Result Date: 01/01/2021 CLINICAL DATA:  Pneumonia, pleural effusion or abscess suspected. EXAM: CT CHEST WITHOUT CONTRAST TECHNIQUE: Multidetector CT imaging of the chest was performed following the standard protocol without IV contrast. COMPARISON:  Chest x-ray obtained earlier today FINDINGS: Cardiovascular: Limited evaluation in the absence of intravenous contrast. No evidence of aneurysm. Scattered heterogeneous atherosclerotic plaque. Small penetrating atherosclerotic ulceration in the region of the ligamentum arteriosum. The maximal aortic   diameter at this location is 3.7 cm which is nonaneurysmal. Calcifications present throughout the coronary arteries. The heart is normal in size. No pericardial effusion. Mediastinum/Nodes: Unremarkable CT appearance of the thyroid gland. No suspicious mediastinal or hilar adenopathy. No soft tissue mediastinal mass. The thoracic esophagus is unremarkable. Lungs/Pleura: Nonspecific 0.8 mm pulmonary nodule in the  right lung apex which may represent a focus of pleuroparenchymal scarring. Diffuse mild bronchial wall thickening. Minimal dependent atelectasis in the lower lungs. No focal infiltrate, pleural effusion or pneumothorax. Upper Abdomen: No acute abnormality. Musculoskeletal: T5 compression fracture with approximately 50% height loss appears chronic. No focal lucency. Compression deformity of the inferior endplate of T9 with less than 20% height loss also appears chronic in nature. No acute osseous abnormality. IMPRESSION: 1. Low inspiratory volumes with mild bibasilar atelectasis. 2. Diffuse bilateral lower lobe bronchial wall thickening is favored to be chronic in nature. Acute on chronic bronchitis is difficult to exclude by imaging alone. 3. 8 mm pulmonary nodule in the right lung apex. Non-contrast chest CT at 6-12 months is recommended. If the nodule is stable at time of repeat CT, then future CT at 18-24 months (from today's scan) is considered optional for low-risk patients, but is recommended for high-risk patients. This recommendation follows the consensus statement: Guidelines for Management of Incidental Pulmonary Nodules Detected on CT Images: From the Fleischner Society 2017; Radiology 2017; 284:228-243. 4. Aortic atherosclerosis with a 2.0 x 1.3 cm penetrating atherosclerotic ulceration at the aortic isthmus. Aortic Atherosclerosis (ICD10-I70.0). 5. T5 and T9 compression fractures are favored to be chronic in nature. Electronically Signed   By: Heath  McCullough M.D.   On: 01/01/2021 08:58   DG Chest Port 1 View  Result Date: 01/25/2021 CLINICAL DATA:  Leukocytosis with concern for aspiration EXAM: PORTABLE CHEST 1 VIEW COMPARISON:  January 21, 2021 FINDINGS: Airspace opacity is noted in portions of the mid and lower lung regions on the right as well as to a lesser extent in the left base. There is atelectatic change in the left base. No consolidation. Heart size and pulmonary vascular within normal  limits. No adenopathy. No bone lesions. IMPRESSION: Patchy airspace opacity right mid and lower lung regions, increased. Subtle airspace opacity left base with left base atelectasis. Suspect multifocal pneumonia, although aspiration could present similarly. Both aspiration in pneumonia may present concurrently. Stable cardiac silhouette. Electronically Signed   By: William  Woodruff III M.D.   On: 01/25/2021 08:16   DG Chest Port 1 View  Result Date: 01/21/2021 CLINICAL DATA:  Increased cough, shortness of breath. Recent aspiration pneumonia. EXAM: PORTABLE CHEST 1 VIEW COMPARISON:  01/15/2021 FINDINGS: Normal heart size. Scratch set stable cardiomediastinal contours. Low lung volumes. Small pleural effusions and pulmonary vascular congestion. Opacity noted within the left base compatible with aspiration or pneumonia. IMPRESSION: 1. Left base opacity compatible with aspiration or pneumonia. 2. Small bilateral pleural effusions and pulmonary vascular congestion. Electronically Signed   By: Taylor  Stroud M.D.   On: 01/21/2021 08:38   DG Chest Port 1 View  Result Date: 01/15/2021 CLINICAL DATA:  Shortness of breath and dizziness. EXAM: PORTABLE CHEST 1 VIEW COMPARISON:  01/01/2021 FINDINGS: The cardiac silhouette, mediastinal and hilar contours are within normal limits and stable. The lungs are clear of an acute process. Low lung volumes with vascular crowding and streaky basilar atelectasis but no infiltrates, edema or effusions. The bony thorax is intact. IMPRESSION: Low lung volumes with vascular crowding and streaky basilar atelectasis. Electronically Signed   By: P.    Gallerani M.D.   On: 01/15/2021 12:45    Microbiology: Recent Results (from the past 240 hour(s))  MRSA PCR Screening     Status: None   Collection Time: 01/21/21  3:57 PM   Specimen: Nasal Mucosa; Nasopharyngeal  Result Value Ref Range Status   MRSA by PCR NEGATIVE NEGATIVE Final    Comment:        The GeneXpert MRSA Assay  (FDA approved for NASAL specimens only), is one component of a comprehensive MRSA colonization surveillance program. It is not intended to diagnose MRSA infection nor to guide or monitor treatment for MRSA infections. Performed at  [facility], [address] [place], [place] [address]      Labs: Basic Metabolic Panel: Recent Labs  Lab 01/21/21 0412 01/22/21 0413 01/23/21 0434 01/24/21 0322 01/25/21 0534  NA 140 139 138 137 140  K 3.5 3.5 3.9 3.7 4.0  CL 105 105 105 103 104  CO2 27 28 26 25 25  GLUCOSE 104* 108* 95 91 125*  BUN 21 18 16 18 24*  CREATININE 1.04 1.08 1.13 1.02 1.51*  CALCIUM 8.1* 7.9* 8.2* 8.2* 8.2*  MG  --  1.9  --   --   --    Liver Function Tests: No results for input(s): AST, ALT, ALKPHOS, BILITOT, PROT, ALBUMIN in the last 168 hours. No results for input(s): LIPASE, AMYLASE in the last 168 hours. No results for input(s): AMMONIA in the last 168 hours. CBC: Recent Labs  Lab 01/22/21 0413 01/25/21 0534  WBC 11.1* 18.2*  HGB 9.6* 10.3*  HCT 27.5* 29.1*  MCV 129.1* 126.0*  PLT 465* 365   Cardiac Enzymes: No results for input(s): CKTOTAL, CKMB, CKMBINDEX, TROPONINI in the last 168 hours. BNP: BNP (last 3 results) Recent Labs    01/15/21 1215 01/20/21 0335 01/21/21 0412  BNP 274.6* 587.6* 337.2*    ProBNP (last 3 results) No results for input(s): PROBNP in the last 8760 hours.  CBG: No results for input(s): GLUCAP in the last 168 hours.     Signed:    MD.  [facility] 01/27/2021, 3:38 PM   

## 2021-01-27 NOTE — Progress Notes (Addendum)
Patient is being discharged to hospice home.  Called report to [place], Therapist, sports.  Rx and a copy of AVS placed in discharge packet for receiving facility.  Awaiting EMS.

## 2021-01-27 NOTE — TOC Transition Note (Signed)
Transition of Care [facility]) - CM/SW Discharge Note   Patient Details  Name: Lee Mercado MRN: [id number] Date of Birth: Apr 25, 1925  [facility] CM/SW Contact:  Trecia Rogers, LCSW Phone Number: 01/27/2021, 3:21 PM   Clinical Narrative:     CSW received a call from [facility] that a bed came available and that the patient can come. Doctor agreed. Transport is being set up by hospice home for 4:30pm. Number to call report for nurse is: [phone].  CSW will fax over discharge summary to hospice home. CSW attempted to call patient's son.   Final next level of care: [place] Barriers to Discharge: Hospice Bed not available   Patient Goals and CMS Choice Patient states their goals for this hospitalization and ongoing recovery are:: Family has decided on hospice home- [facility] CMS Medicare.gov Compare Post Acute Care list provided to:: Patient Choice offered to / list presented to : [place]  Discharge Placement                Patient to be transferred to facility by: [place] with hospice home set up transfer Name of family member notified: Yichen Gilardi [facility]) [phone] (left a voicemail) Patient and family notified of of transfer: 01/27/21  Discharge Plan and Services In-house Referral: Hospice / Palliative Care Discharge Planning Services: CM Consult            DME Arranged: N/A DME Agency: NA       HH Arranged: NA          Social Determinants of Health ([place]) Interventions     Readmission Risk Interventions Readmission Risk Prevention Plan 01/18/2021  Transportation Screening Complete  PCP or Specialist Appt within 5-7 Days Complete  Home Care Screening Complete  Medication Review (RN CM) Complete  Some recent data might be hidden

## 2021-02-28 DEATH — deceased

## 2021-06-26 IMAGING — CT CT HEAD W/O CM
3 of 5 series · 14 of 47 positions shown, 16 images · non-contrast
Comparison: Head CT November 11, 2017 and brain MRI November 12, 2017

CLINICAL DATA: Recent falls

EXAM:
CT HEAD WITHOUT CONTRAST
TECHNIQUE: Contiguous axial images were obtained from the base of the skull
through the vertex without intravenous contrast.

[Series 4: coronal soft tissue · coronal · 0.32mm/px · 3 of 70 slices shown]
[im 24/70  brain]
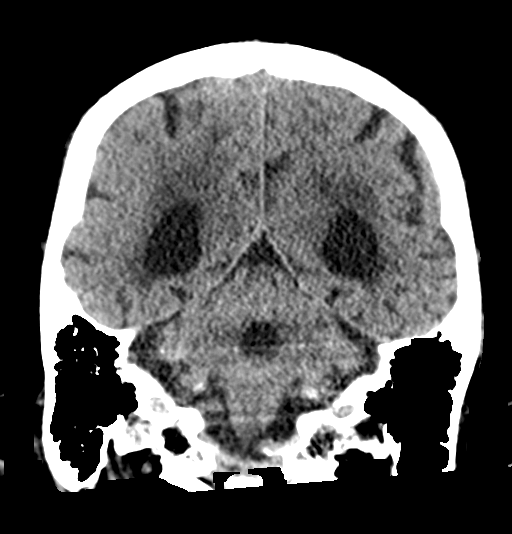
[im 31/70  brain]
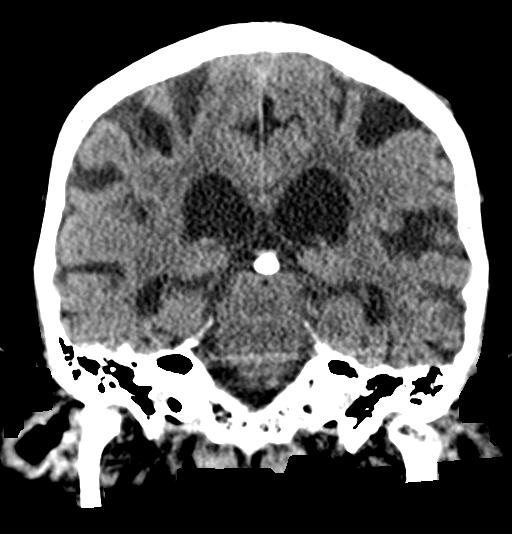
[im 39/70  brain]
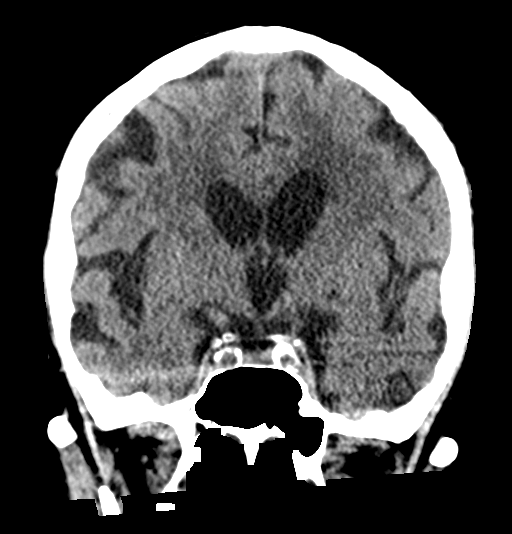

[Series 5: sagittal soft tissue · sagittal · 0.33mm/px · 3 of 53 slices shown]
[im 18/53  brain]
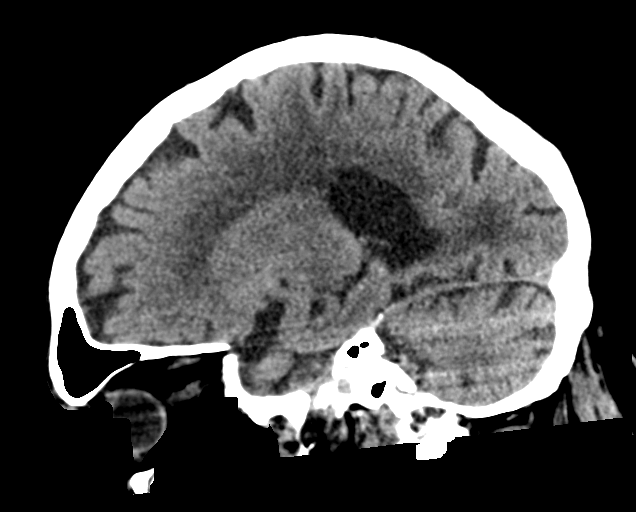
[im 27/53  brain]
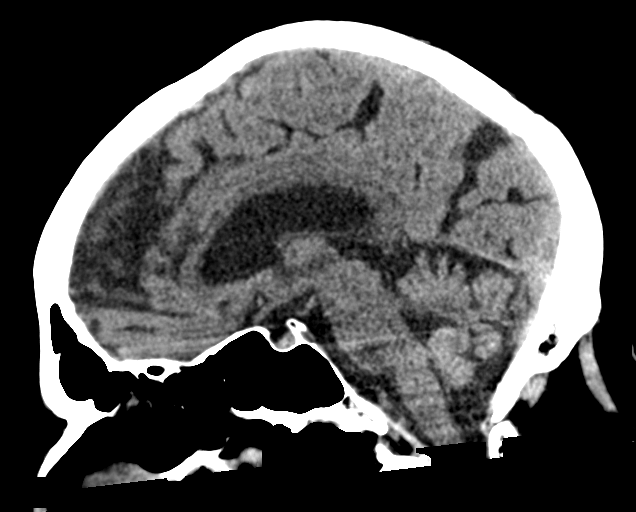
[im 35/53  brain]
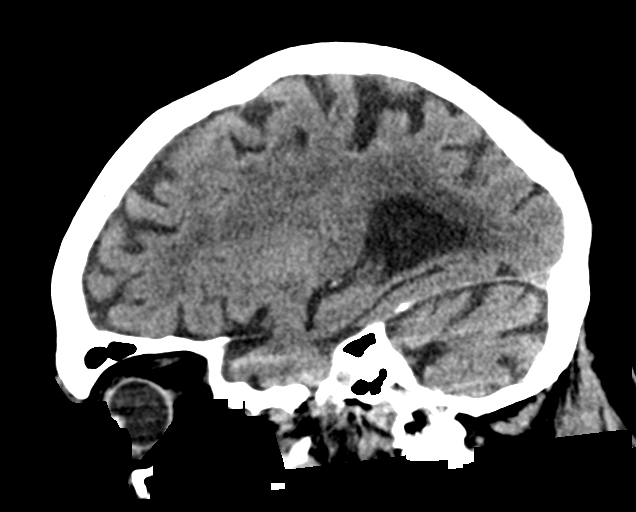

[Series 6: axial soft tissue · axial · 0.34mm/px · z∈[-121,+9]mm · 8 of 55 slices shown, 10 images]
[im 5/55  brain]
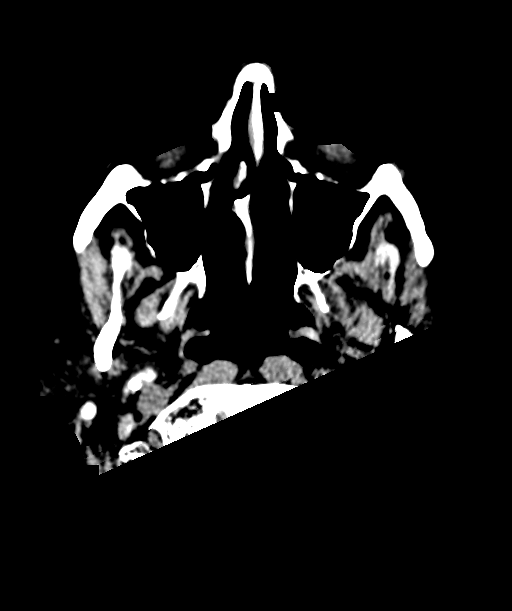
[im 5/55  bone]
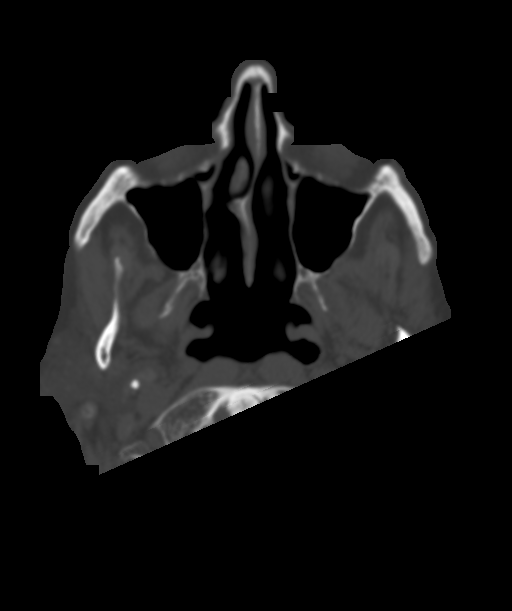
[im 10/55  brain]
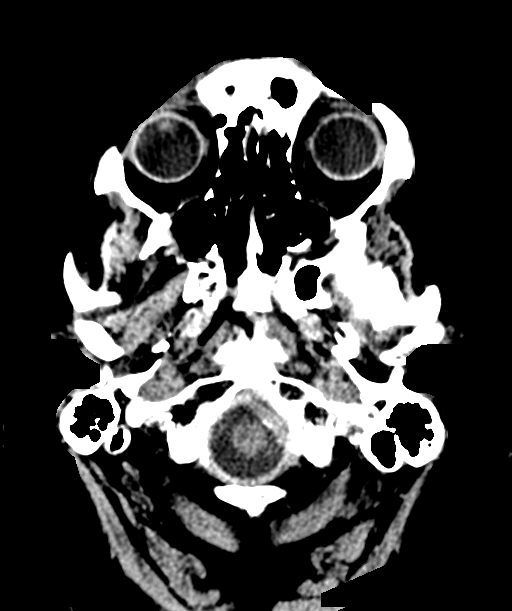
[im 20/55  brain]
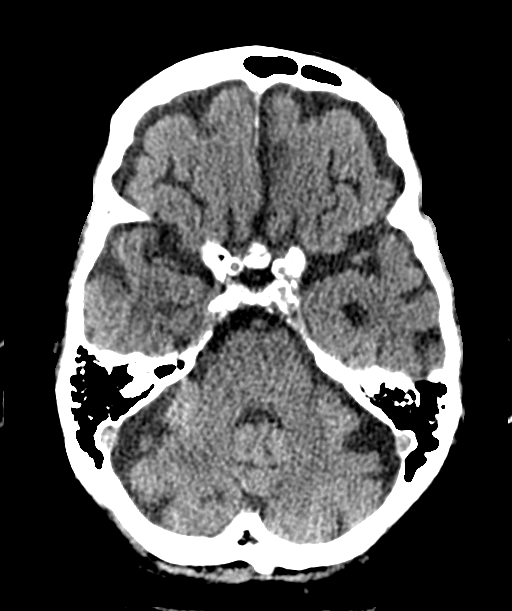
[im 25/55  brain]
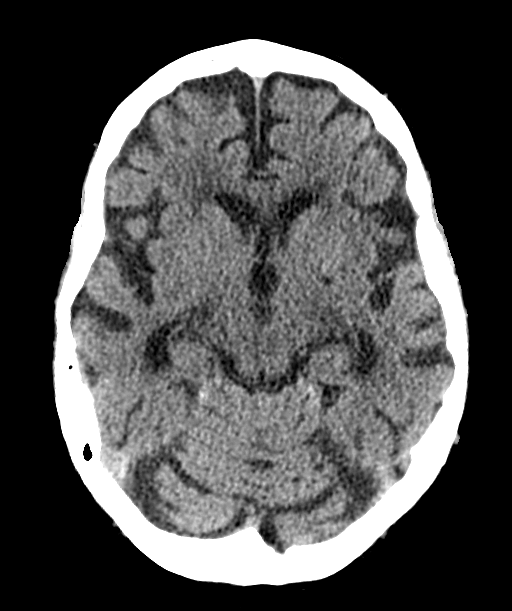
[im 30/55  brain]
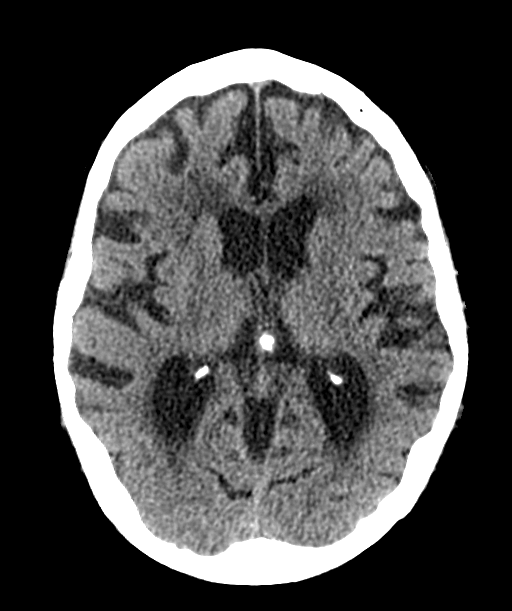
[im 30/55  bone]
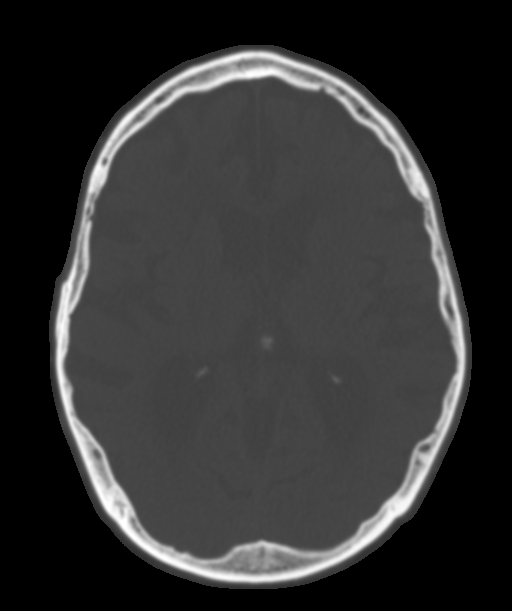
[im 35/55  brain]
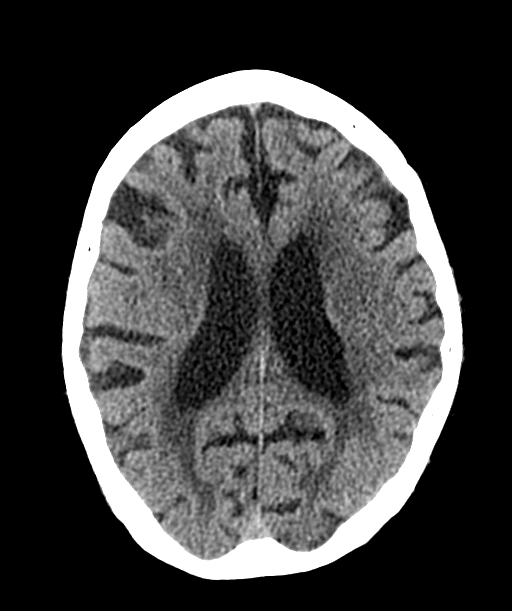
[im 45/55  brain]
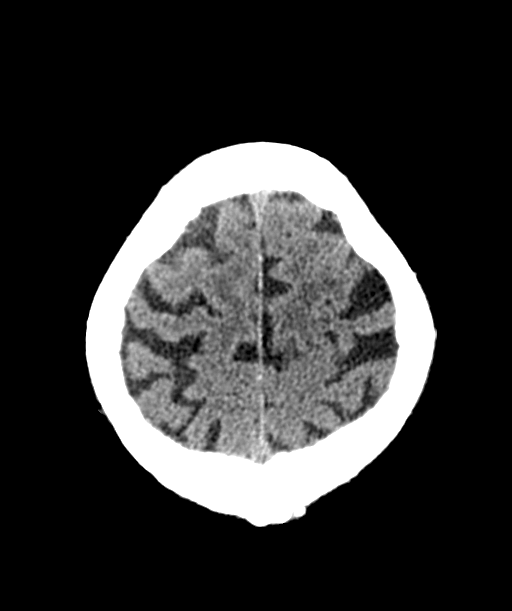
[im 50/55  brain]
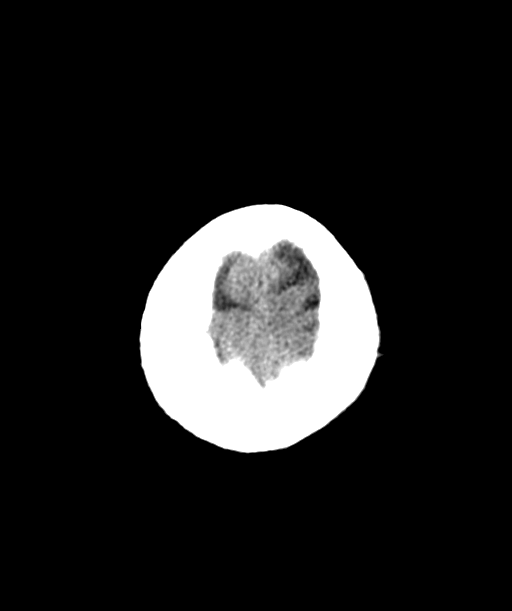

[14 of 47 positions shown; findings below may reference images not displayed]

FINDINGS: Brain: Mild diffuse atrophy is stable. There is no intracranial
mass, hemorrhage, extra-axial fluid collection, or midline shift.
Scattered small prior cerebellar infarcts are stable in appearance.
There is small vessel disease in the centra semiovale bilaterally,
stable. No acute appearing infarct is evident on this study.

Vascular: No hyperdense vessel. There is calcification in each
carotid siphon region.

Skull: Bony calvarium appears intact.

Sinuses/Orbits: There is a small retention cyst in mid left ethmoid
region. Other visualized paranasal sinuses are clear. Apparent prior
cataract removal on the left. Orbits otherwise appear symmetric
bilaterally.

Other: Visualized mastoid air cells are clear.
IMPRESSION: Stable atrophy with periventricular small vessel disease. Prior
small infarcts in the cerebellar hemispheres bilaterally appear
stable. No acute infarct evident. No mass or hemorrhage.

There are foci of arterial vascular calcification. Small retention
cyst in a mid left ethmoid air cell.
